# Patient Record
Sex: Male | Born: 1945 | Race: Black or African American | Hispanic: No | Marital: Single | Smoking: Former smoker
Health system: Southern US, Community
[De-identification: ages and names within clinical notes are randomized; demographics above are authoritative.]

## PROBLEM LIST (undated history)

## (undated) DIAGNOSIS — F1011 Alcohol abuse, in remission: Secondary | ICD-10-CM

## (undated) DIAGNOSIS — E785 Hyperlipidemia, unspecified: Secondary | ICD-10-CM

## (undated) DIAGNOSIS — Z720 Tobacco use: Secondary | ICD-10-CM

## (undated) DIAGNOSIS — IMO0002 Reserved for concepts with insufficient information to code with codable children: Secondary | ICD-10-CM

## (undated) DIAGNOSIS — Z8614 Personal history of Methicillin resistant Staphylococcus aureus infection: Secondary | ICD-10-CM

## (undated) DIAGNOSIS — Z8546 Personal history of malignant neoplasm of prostate: Secondary | ICD-10-CM

## (undated) DIAGNOSIS — I10 Essential (primary) hypertension: Secondary | ICD-10-CM

## (undated) DIAGNOSIS — K559 Vascular disorder of intestine, unspecified: Secondary | ICD-10-CM

## (undated) HISTORY — DX: Tobacco use: Z72.0

## (undated) HISTORY — DX: Reserved for concepts with insufficient information to code with codable children: IMO0002

## (undated) HISTORY — DX: Personal history of Methicillin resistant Staphylococcus aureus infection: Z86.14

## (undated) HISTORY — PX: COLOSTOMY: SHX63

## (undated) HISTORY — DX: Personal history of malignant neoplasm of prostate: Z85.46

## (undated) HISTORY — DX: Vascular disorder of intestine, unspecified: K55.9

## (undated) HISTORY — DX: Hyperlipidemia, unspecified: E78.5

## (undated) HISTORY — DX: Essential (primary) hypertension: I10

## (undated) HISTORY — DX: Alcohol abuse, in remission: F10.11

---

## 2002-12-20 DIAGNOSIS — IMO0002 Reserved for concepts with insufficient information to code with codable children: Secondary | ICD-10-CM

## 2002-12-20 DIAGNOSIS — F1011 Alcohol abuse, in remission: Secondary | ICD-10-CM

## 2002-12-20 DIAGNOSIS — K559 Vascular disorder of intestine, unspecified: Secondary | ICD-10-CM

## 2002-12-20 HISTORY — DX: Alcohol abuse, in remission: F10.11

## 2002-12-20 HISTORY — DX: Vascular disorder of intestine, unspecified: K55.9

## 2002-12-20 HISTORY — PX: SUBTOTAL COLECTOMY: SHX855

## 2002-12-20 HISTORY — DX: Reserved for concepts with insufficient information to code with codable children: IMO0002

## 2003-03-13 ENCOUNTER — Encounter: Payer: Self-pay | Admitting: Emergency Medicine

## 2003-03-13 ENCOUNTER — Inpatient Hospital Stay (HOSPITAL_COMMUNITY): Admission: EM | Admit: 2003-03-13 | Discharge: 2003-04-12 | Payer: Self-pay | Admitting: Emergency Medicine

## 2003-03-14 ENCOUNTER — Encounter (INDEPENDENT_AMBULATORY_CARE_PROVIDER_SITE_OTHER): Payer: Self-pay | Admitting: Specialist

## 2003-03-16 ENCOUNTER — Encounter: Payer: Self-pay | Admitting: General Surgery

## 2003-03-18 ENCOUNTER — Encounter: Payer: Self-pay | Admitting: General Surgery

## 2003-03-23 ENCOUNTER — Encounter: Payer: Self-pay | Admitting: Surgery

## 2003-03-25 ENCOUNTER — Encounter (INDEPENDENT_AMBULATORY_CARE_PROVIDER_SITE_OTHER): Payer: Self-pay | Admitting: *Deleted

## 2003-05-03 ENCOUNTER — Ambulatory Visit (HOSPITAL_COMMUNITY): Admission: RE | Admit: 2003-05-03 | Discharge: 2003-05-03 | Payer: Self-pay | Admitting: *Deleted

## 2003-09-19 ENCOUNTER — Ambulatory Visit: Admission: RE | Admit: 2003-09-19 | Discharge: 2003-12-18 | Payer: Self-pay | Admitting: Radiation Oncology

## 2004-01-13 ENCOUNTER — Ambulatory Visit: Admission: RE | Admit: 2004-01-13 | Discharge: 2004-01-13 | Payer: Self-pay | Admitting: Radiation Oncology

## 2004-07-13 ENCOUNTER — Ambulatory Visit: Admission: RE | Admit: 2004-07-13 | Discharge: 2004-07-13 | Payer: Self-pay | Admitting: Radiation Oncology

## 2007-05-27 ENCOUNTER — Emergency Department (HOSPITAL_COMMUNITY): Admission: EM | Admit: 2007-05-27 | Discharge: 2007-05-27 | Payer: Self-pay | Admitting: Emergency Medicine

## 2007-05-29 ENCOUNTER — Ambulatory Visit: Payer: Self-pay | Admitting: Emergency Medicine

## 2007-05-29 ENCOUNTER — Inpatient Hospital Stay (HOSPITAL_COMMUNITY): Admission: EM | Admit: 2007-05-29 | Discharge: 2007-06-08 | Payer: Self-pay | Admitting: *Deleted

## 2007-05-30 ENCOUNTER — Encounter: Payer: Self-pay | Admitting: Emergency Medicine

## 2007-12-21 DIAGNOSIS — Z8614 Personal history of Methicillin resistant Staphylococcus aureus infection: Secondary | ICD-10-CM

## 2007-12-21 HISTORY — DX: Personal history of Methicillin resistant Staphylococcus aureus infection: Z86.14

## 2008-04-28 IMAGING — CR DG CHEST 2V
2 series · 2 of 2 positions shown · non-contrast
Comparison: 06/03/07.

CLINICAL DATA: A 61-year-old with pneumonia and hypoxia. 
 CHEST ? 2 VIEW ? 06/07/07:

[w chest pa]
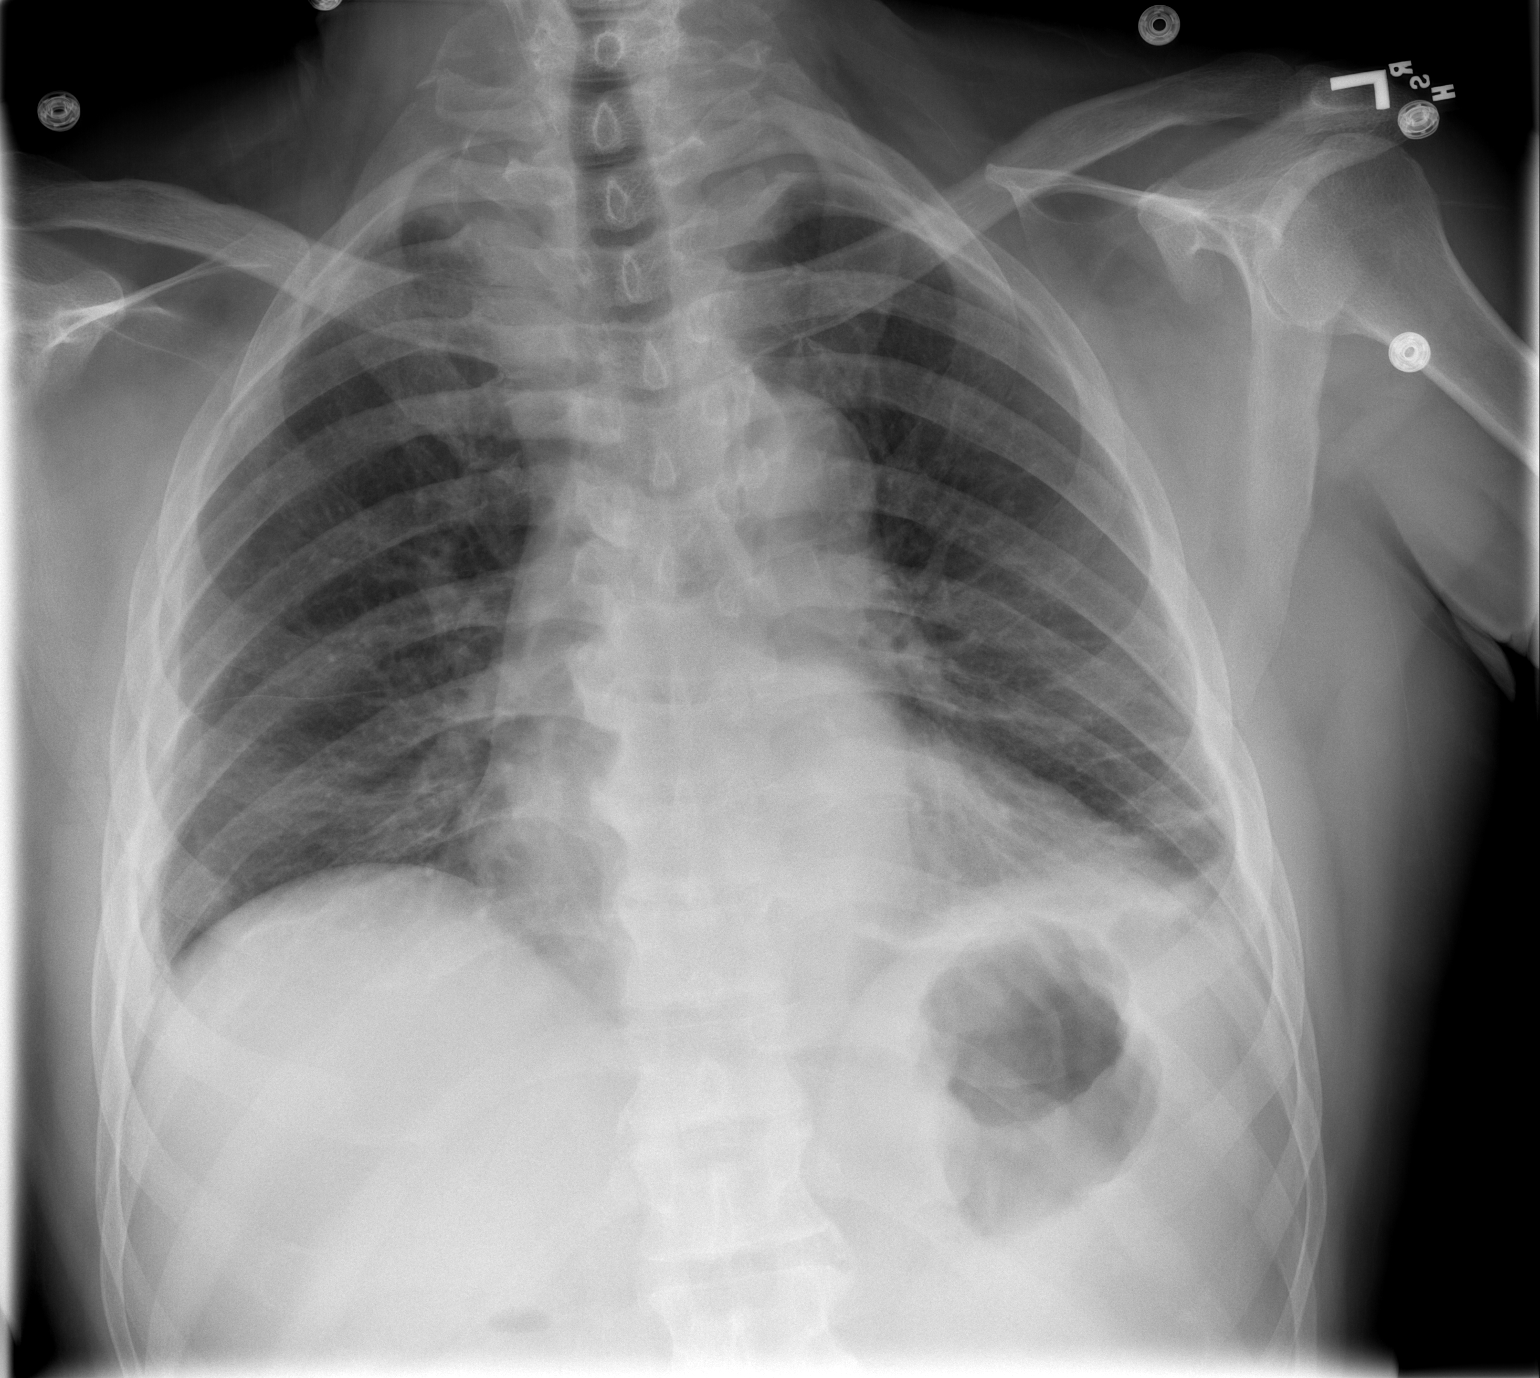

[w chest lat]
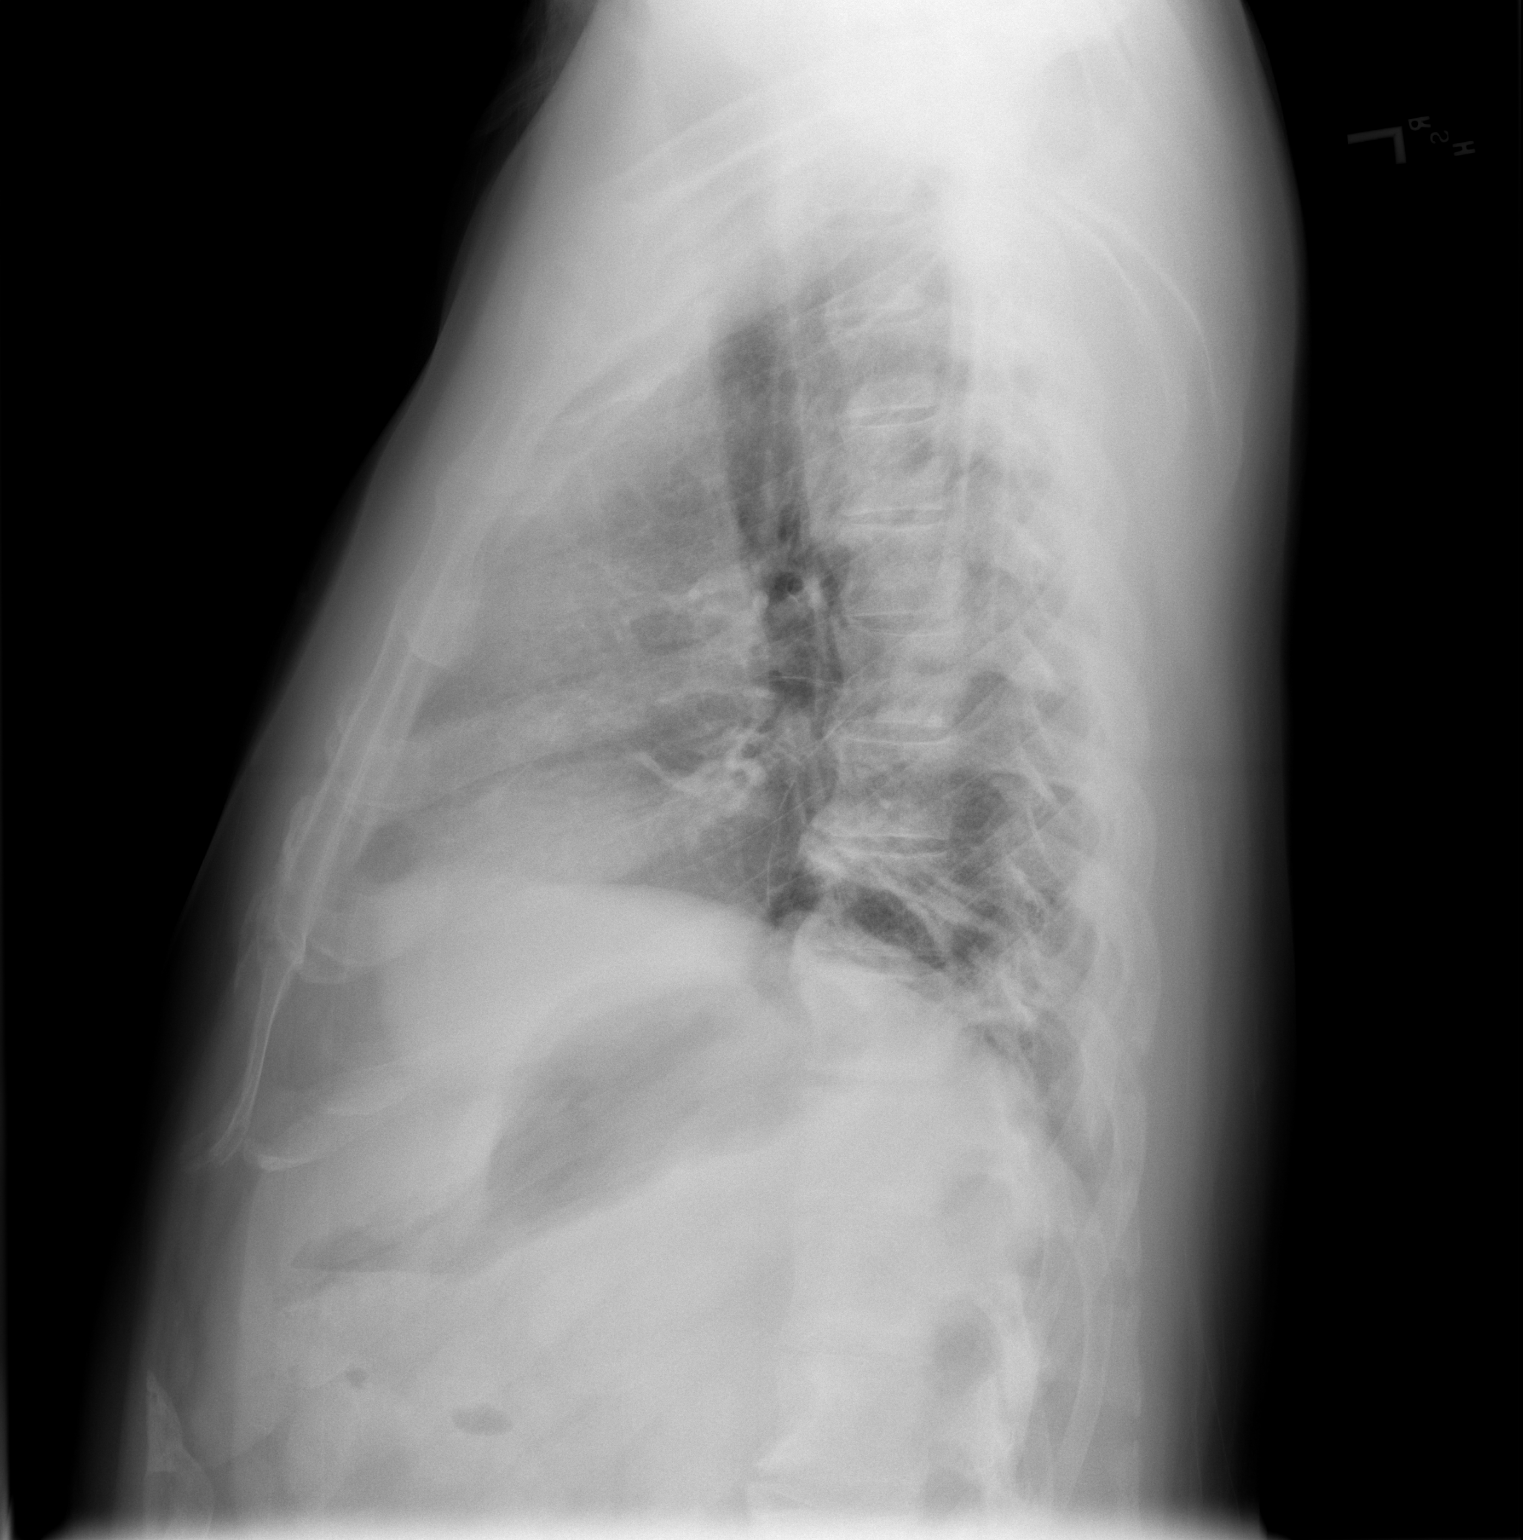

[2 of 2 positions shown; findings below may reference images not displayed]

FINDINGS: Two views of the chest demonstrate persistent interstitial densities in the left lower lung region.  There is no significant pleural fluid.  Improved lung aerations compared to the prior examination.  The heart and mediastinum are stable.  The trachea is midline.   The right PICC line has been removed.
IMPRESSION: Improved lung aeration compared to the prior exam with residual linear densities in the left lung base.  Differential includes atelectasis versus residual infection.

## 2008-07-26 ENCOUNTER — Inpatient Hospital Stay (HOSPITAL_COMMUNITY): Admission: AD | Admit: 2008-07-26 | Discharge: 2008-07-28 | Payer: Self-pay | Admitting: Internal Medicine

## 2010-07-27 ENCOUNTER — Inpatient Hospital Stay (HOSPITAL_COMMUNITY): Admission: EM | Admit: 2010-07-27 | Discharge: 2010-07-29 | Payer: Self-pay | Admitting: Emergency Medicine

## 2010-09-08 ENCOUNTER — Emergency Department (HOSPITAL_COMMUNITY): Admission: EM | Admit: 2010-09-08 | Discharge: 2010-09-08 | Payer: Self-pay | Admitting: Emergency Medicine

## 2011-01-09 ENCOUNTER — Encounter: Payer: Self-pay | Admitting: General Surgery

## 2011-03-04 LAB — DIFFERENTIAL
Basophils Absolute: 0 10*3/uL (ref 0.0–0.1)
Eosinophils Absolute: 0 10*3/uL (ref 0.0–0.7)
Eosinophils Relative: 0 % (ref 0–5)
Lymphocytes Relative: 8 % — ABNORMAL LOW (ref 12–46)
Neutrophils Relative %: 88 % — ABNORMAL HIGH (ref 43–77)

## 2011-03-04 LAB — URINE MICROSCOPIC-ADD ON

## 2011-03-04 LAB — URINALYSIS, ROUTINE W REFLEX MICROSCOPIC
Glucose, UA: NEGATIVE mg/dL
Leukocytes, UA: NEGATIVE
Protein, ur: 100 mg/dL — AB
Specific Gravity, Urine: 1.03 (ref 1.005–1.030)
pH: 5.5 (ref 5.0–8.0)

## 2011-03-04 LAB — CBC
MCV: 92.9 fL (ref 78.0–100.0)
Platelets: 190 10*3/uL (ref 150–400)
RDW: 13.1 % (ref 11.5–15.5)
WBC: 11.1 10*3/uL — ABNORMAL HIGH (ref 4.0–10.5)

## 2011-03-04 LAB — BASIC METABOLIC PANEL
BUN: 9 mg/dL (ref 6–23)
Calcium: 9.7 mg/dL (ref 8.4–10.5)
Chloride: 102 mEq/L (ref 96–112)
Creatinine, Ser: 1.03 mg/dL (ref 0.4–1.5)

## 2011-03-05 LAB — URINALYSIS, MICROSCOPIC ONLY
Glucose, UA: NEGATIVE mg/dL
Specific Gravity, Urine: 1.015 (ref 1.005–1.030)
Urobilinogen, UA: 0.2 mg/dL (ref 0.0–1.0)

## 2011-03-05 LAB — COMPREHENSIVE METABOLIC PANEL
ALT: 16 U/L (ref 0–53)
ALT: 19 U/L (ref 0–53)
AST: 20 U/L (ref 0–37)
AST: 21 U/L (ref 0–37)
Albumin: 3.7 g/dL (ref 3.5–5.2)
Alkaline Phosphatase: 46 U/L (ref 39–117)
Alkaline Phosphatase: 47 U/L (ref 39–117)
CO2: 29 mEq/L (ref 19–32)
CO2: 30 mEq/L (ref 19–32)
Calcium: 9.2 mg/dL (ref 8.4–10.5)
Chloride: 103 mEq/L (ref 96–112)
Creatinine, Ser: 1.12 mg/dL (ref 0.4–1.5)
GFR calc Af Amer: 24 mL/min — ABNORMAL LOW (ref >60)
GFR calc Af Amer: >60 mL/min (ref >60)
GFR calc non Af Amer: 20 mL/min — ABNORMAL LOW (ref >60)
GFR calc non Af Amer: >60 mL/min (ref >60)
Glucose, Bld: 110 mg/dL — ABNORMAL HIGH (ref 70–99)
Glucose, Bld: 164 mg/dL — ABNORMAL HIGH (ref 70–99)
Potassium: 3.9 mEq/L (ref 3.5–5.1)
Potassium: 4.7 mEq/L (ref 3.5–5.1)
Sodium: 133 mEq/L — ABNORMAL LOW (ref 135–145)
Sodium: 139 mEq/L (ref 135–145)
Total Bilirubin: 0.5 mg/dL (ref 0.3–1.2)
Total Protein: 6.7 g/dL (ref 6.0–8.3)
Total Protein: 7.4 g/dL (ref 6.0–8.3)

## 2011-03-05 LAB — HEMOGLOBIN A1C
Hgb A1c MFr Bld: 6.5 % — ABNORMAL HIGH (ref <5.7)
Mean Plasma Glucose: 140 mg/dL — ABNORMAL HIGH (ref <117)

## 2011-03-05 LAB — GLUCOSE, CAPILLARY
Glucose-Capillary: 107 mg/dL — ABNORMAL HIGH (ref 70–99)
Glucose-Capillary: 121 mg/dL — ABNORMAL HIGH (ref 70–99)
Glucose-Capillary: 177 mg/dL — ABNORMAL HIGH (ref 70–99)
Glucose-Capillary: 181 mg/dL — ABNORMAL HIGH (ref 70–99)
Glucose-Capillary: 93 mg/dL (ref 70–99)

## 2011-03-05 LAB — DIFFERENTIAL
Basophils Absolute: 0 10*3/uL (ref 0.0–0.1)
Basophils Relative: 0 % (ref 0–1)
Basophils Relative: 0 % (ref 0–1)
Eosinophils Absolute: 0 10*3/uL (ref 0.0–0.7)
Eosinophils Absolute: 0.1 10*3/uL (ref 0.0–0.7)
Eosinophils Relative: 0 % (ref 0–5)
Lymphocytes Relative: 4 % — ABNORMAL LOW (ref 12–46)
Lymphocytes Relative: 7 % — ABNORMAL LOW (ref 12–46)
Lymphs Abs: 0.4 10*3/uL — ABNORMAL LOW (ref 0.7–4.0)
Lymphs Abs: 1 10*3/uL (ref 0.7–4.0)
Monocytes Absolute: 0.2 10*3/uL (ref 0.1–1.0)
Monocytes Absolute: 0.9 10*3/uL (ref 0.1–1.0)
Monocytes Relative: 3 % (ref 3–12)
Monocytes Relative: 8 % (ref 3–12)
Neutro Abs: 9.3 10*3/uL — ABNORMAL HIGH (ref 1.7–7.7)
Neutrophils Relative %: 85 % — ABNORMAL HIGH (ref 43–77)
Neutrophils Relative %: 93 % — ABNORMAL HIGH (ref 43–77)

## 2011-03-05 LAB — CBC
HCT: 45.1 % (ref 39.0–52.0)
HCT: 45.7 % (ref 39.0–52.0)
Hemoglobin: 14.9 g/dL (ref 13.0–17.0)
Hemoglobin: 15 g/dL (ref 13.0–17.0)
MCH: 31.2 pg (ref 26.0–34.0)
MCH: 31.3 pg (ref 26.0–34.0)
MCHC: 33.3 g/dL (ref 30.0–36.0)
MCHC: 33.3 g/dL (ref 30.0–36.0)
MCV: 93.8 fL (ref 78.0–100.0)
Platelets: 166 10*3/uL (ref 150–400)
Platelets: 177 10*3/uL (ref 150–400)
RBC: 4.81 MIL/uL (ref 4.22–5.81)
RDW: 13.6 % (ref 11.5–15.5)
RDW: 13.8 % (ref 11.5–15.5)
RDW: 14.1 % (ref 11.5–15.5)
WBC: 10 10*3/uL (ref 4.0–10.5)
WBC: 11.9 10*3/uL — ABNORMAL HIGH (ref 4.0–10.5)

## 2011-03-05 LAB — PHOSPHORUS
Phosphorus: 2 mg/dL — ABNORMAL LOW (ref 2.3–4.6)
Phosphorus: 4.3 mg/dL (ref 2.3–4.6)

## 2011-03-05 LAB — POCT CARDIAC MARKERS: CKMB, poc: 2.8 ng/mL (ref 1.0–8.0)

## 2011-03-05 LAB — ACTH STIMULATION, 3 TIME POINTS
Cortisol, 30 Min: 23.6 ug/dL
Cortisol, 60 Min: 31.1 ug/dL
Cortisol, Base: 3.4 ug/dL

## 2011-03-05 LAB — UREA NITROGEN, URINE: Urea Nitrogen, Ur: 527 mg/dL

## 2011-03-05 LAB — BASIC METABOLIC PANEL WITH GFR
BUN: 76 mg/dL — ABNORMAL HIGH (ref 6–23)
CO2: 23 meq/L (ref 19–32)
Calcium: 8.6 mg/dL (ref 8.4–10.5)
Chloride: 99 meq/L (ref 96–112)
Creatinine, Ser: 5.42 mg/dL — ABNORMAL HIGH (ref 0.4–1.5)
GFR calc non Af Amer: 11 mL/min — ABNORMAL LOW
Glucose, Bld: 130 mg/dL — ABNORMAL HIGH (ref 70–99)
Potassium: 4.4 meq/L (ref 3.5–5.1)
Sodium: 134 meq/L — ABNORMAL LOW (ref 135–145)

## 2011-03-05 LAB — CARDIAC PANEL(CRET KIN+CKTOT+MB+TROPI)
Relative Index: 1.2 (ref 0.0–2.5)
Troponin I: 0.01 ng/mL (ref 0.00–0.06)

## 2011-03-05 LAB — MRSA PCR SCREENING: MRSA by PCR: NEGATIVE

## 2011-03-05 LAB — CK: Total CK: 342 U/L — ABNORMAL HIGH (ref 7–232)

## 2011-05-04 NOTE — Consult Note (Signed)
NAME:  [name], [name] NO.:  [id number]   MEDICAL RECORD NO.:  [id number]          PATIENT TYPE:  INP   LOCATION:  1522                         FACILITY:  [facility]   PHYSICIAN:  Lennie Muckle, MD      DATE OF BIRTH:  Oct 26, 1946   DATE OF CONSULTATION:  07/26/2008  DATE OF DISCHARGE:                                 CONSULTATION   REASON FOR CONSULTATION:  Abscess on the head.   HISTORY OF PRESENT ILLNESS:  [name] is a 65 year old male who  apparently had a swelling on his head for approximately a week.  He had  treated this at home with warm compresses and had some amount of  drainage, but he had had a previous abscess in the left neck which  caused him some complications.  Therefore, I was asked to evaluate his  head.  He has had no fevers or chills.   PAST MEDICAL HISTORY:  Diabetes, hypertension, prostate, cancer,  ischemic bowel.   SURGEON:  Ischemic bowel resection in 2004.  Neck mass I and D in 2008.   SOCIAL HISTORY:  He does smoke approximately a pack a day.  No  significant alcohol use since 2004.   MEDICATIONS:  Multiple and include Zocor, carvedilol, fish oil, vitamin  D, metformin, Viagra, aspirin, Benadryl, and amlodipine.   ALLERGIES:  NO DRUG ALLERGIES:   REVIEW OF SYSTEMS:  Negative.   PHYSICAL EXAMINATION:  Temperature is 98.2, pulse 101, blood pressure  140/80.  HEENT:  There is an approximately 3 cm lesion right on the top of his  head.  Apparent fluid is expressed.  It is fluctuant.  No  lymphadenopathy is appreciated, superior cervical chain, posterior  chain, or periauricularly.  CARDIOVASCULAR:  Regular rate and rhythm.  CHEST:  Clear to auscultation bilaterally   ASSESSMENT AND PLAN:  Abscess on the head.   Procedure was performed at the bedside with I and D of the abscess.  I  anesthetized the area with 1% lidocaine.  Using a #11 blade, I incised  the abscess cavity.  Purulent fluid was expressed.  This was sent for  culture.  Agree with vancomycin for treatment in the daily dressing  changes, likely shower tomorrow and this healing by secondary intent.  Continue with diabetic management and hopefully home in the next couple  of days.     Lennie Muckle, MD  Electronically Signed    ALA/MEDQ  D:  07/26/2008  T:  07/27/2008  Job:  [id number]

## 2011-05-04 NOTE — Discharge Summary (Signed)
NAME:  CHAS, Philip [name]                ACCOUNT NO.:  [id number]   MEDICAL RECORD NO.:  [id number]          PATIENT TYPE:  INP   LOCATION:  6715                         FACILITY:  [facility]   PHYSICIAN:  [name]. Perini, M.D.   DATE OF BIRTH:  Apr 30, 1946   DATE OF ADMISSION:  05/29/2007  DATE OF DISCHARGE:  06/08/2007                               DISCHARGE SUMMARY   DISCHARGE DIAGNOSES:  1. Hypoxic, hypercapnic, ventilator dependent respiratory failure      secondary to pneumonia and to coag-negative staph bacteremia, and      also to possible underlying chronic obstructive pulmonary disease      with chronic obstructive pulmonary disease exacerbation.  2. Right nuchal MRSA abscess resolving, left neck abscess status post      drainage with good wound healing at the time of discharge.  3. 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  4. Type 2 diabetes with uncontrolled sugars recently.  5. Prostate cancer, status post radiation therapy.  6. Hypertension.  7. Hyperlipidemia.  8. Remote heavy alcohol use in remission.  9. Tobacco abuse.   PROCEDURES:  1. Critical care consultation and management, vent dependent      respiratory failure with ventilator management.  2. A 2-D echocardiogram on May 31, 2007, which showed increased left      ventricular thickness compared to a study in 2004, and a decrease      in the size of the left atrium, clinical correlation was suggested      as there might have external compression on the left atrium.  Left      ventricular systolic section function was vigorous with an ejection      fraction of 70%, with no left ventricular regional wall motion      abnormalities noted.  Right ventricular size was at the upper      limits of normal.  3. Because of these findings, a CT scan of the chest was performed,      which was performed on June 01, 2007, which showed no evidence of      any mediastinal abscess or mass.   It did show a left lower lobe      pneumonia, left supraclavicular lymph nodes likely reactive and may      be related to the neck abscess.  He had fatty infiltration of the      liver and a hyper-dense hepatic to dome lesion versus artifact.  A      followup ultrasound of the liver is recommended.  4. Cardiology consultation.   DISCHARGE MEDICATIONS:  1. Zocor 40 mg each evening.  2. Aspirin 81 mg daily.  3. He is to stop Lotrel and take benazepril 20 mg once daily.  4. He is to resume Coreg 12.5 mg twice a day  5. Vitamin D 50,000 units once a week.  6. Fish oil supplement daily.  7. He is to start metformin 500 mg twice a day with food.  8. Levaquin 750 mg once daily for five further days.  9. Spiriva one dose daily.  10.Albuterol MDI 2 puffs every 4 hours as needed for wheezing, cough,      or shortness of breath.   HISTORY OF PRESENT ILLNESS:  Philip [name] is a pleasant 65 year old gentleman  with a history for colectomy, hypertension, hyperlipidemia, diabetes,  and prostate cancer.  He presented with several weeks of problems,  specifically a cough for 1 month that was not productive.  He denies  shortness of breath or wheezing.  He also was seen in Urgent Care 2-3  days prior to his visit with [place] and given doxycycline and ibuprofen for  a neck abscess, which was cultured and did eventually grow out MRSA.  In  the office, he was hypoxic and had poor peak flows.  He was admitted for  further evaluation and treatment.   HOSPITAL COURSE:  Philip [name] was admitted to a telemetry bed.  He was  treated with inhalers and antibiotics.  Lab work showed a low BNP and so  it was felt that he was not in congestive heart failure.  Surgery did an  incision and drainage of his left neck abscess, draining 60 mL of pus  from this.  Then that first evening of admission, he had a respiratory  arrest and required intubation and was moved to the intensive care unit.  Further evaluation revealed a  left-sided pneumonia and he did grow two  blood cultures positive for coag-negative staph that was sensitive to  oxacillin and vancomycin.  He was treated with intravenous Rocephin and  vancomycin for a total of 10 days.  He was on the ventilator for 3 days  at which time he was extubated and began to improve readily.  He was  moved to the floor.  His neck wound did well with simple dressing  changes.  His breath sounds improved and by June 08, 2007, he was deemed  stable for discharge home.   DISCHARGE PHYSICAL EXAMINATION:  VITAL SIGNS:  Temperature 98.1,  afebrile, pulse 97, respiratory rate 18, blood pressure 117/72, blood  sugars ranged from 70 to 134, 96% saturation on room air.  GENERAL:  He was in no acute distress.  He looked well.  He was  ambulating in the room without difficulty.  LUNGS:  Clear to auscultation bilaterally with no wheezes, rales or  rhonchi.  HEART:  Regular rate and rhythm with no murmur or gallop.  ABDOMEN:  Soft and nontender.  EXTREMITIES:  There was no edema.  NECK:  His left neck wound was dressed.   DISCHARGE LABORATORY DATA:  White count 10.7 with 84% segs, 9%  lymphocytes, 6% monocytes, hemoglobin 14.5, platelet count 277,000.  BNP  was less than 30.  Sodium 138, potassium 4.1, chloride 99, CO2 32, BUN  9, creatinine 0.99, glucose 77, total bili 0.9 alk phos 62, AST 23, ALT  33, total protein 7.2, albumin 2.9, calcium 9.2.  Chest x-ray on June 07, 2007, showed improved aeration of the left lung base, atelectasis  versus residual infection.  Hemoglobin A1c was 9.7.   DISCHARGE INSTRUCTIONS:  1. Philip [name] is to check his sugars twice a day before breakfast and      supper and keep a record of this.  He is to call us if his sugars      were staying over 200.  2. He is to have a followup visit with Dr. Waynard Edwards in 2 weeks.  3. A followup visit with Dr. Colin Benton.  He will call for appointment.  4. He is to follow a heart healthy, diabetic diet. 5. A home  health nurse will change his dressings.  6. He is to stop smoking.  7. He is to call if he has any recurrent problems.           ______________________________  Redge Gainer Waynard Edwards, M.D.     MAP/MEDQ  D:  06/08/2007  T:  06/08/2007  Job:  [id number]   cc:   Alfonse Ras, MD

## 2011-05-04 NOTE — Discharge Summary (Signed)
NAME:  Philip [name], Philip [name]                ACCOUNT NO.:  [id number]   MEDICAL RECORD NO.:  [id number]          PATIENT TYPE:  INP   LOCATION:  1522                         [facility]ACILITY:  [facility]   PHYSICIAN:  [name]. Perini, M.D.   DATE OF BIRTH:  02/15/1946   DATE OF ADMISSION:  07/26/2008  DATE OF DISCHARGE:  07/28/2008                               DISCHARGE SUMMARY   DISCHARGE DIAGNOSES:  1. Scalp abscess status post incision and drainage this admission with      staph aureus growing from the wound culture.  Final wound culture      pending at the time of discharge.  2. Type 2 diabetes.  3. Hypertension.  4. Hyperlipidemia.  5. Colostomy.  6. History of prostate cancer.  7. Previous methicillin-resistant Staphylococcus aureus nuchal abscess      in June 2008 with resultant sepsis and pneumonia after this was      drained.   PROCEDURES:  Incision and drainage of the scalp abscess per general  surgery on the evening of July 26, 2008 and wound care subsequently.   DISCHARGE MEDICATIONS:  1. He is to resume his Zocor 40 mg each evening.  2. Coreg 25 mg twice a day.  3. Fish oil supplement daily.  4. Vitamin D 50,000 units weekly.  5. Metformin 500 mg twice a day with food.  6. Aspirin 81 mg daily.  7. Benazepril 10 mg daily.  8. amlodipine 5 mg daily.  9. He is to also take doxycycline 1 mg twice a day for 10 further      days.  10.He may use Tylenol as needed for pain.   HISTORY OF PRESENT ILLNESS:  The patient is a very pleasant 65 year old  gentleman who presented with a 1-week history of a large fluctuant knot  on the top of his head.  He was admitted for care for this.   HOSPITAL COURSE:  Philip [name] was admitted to a regular bed and general  surgery was consulted and assisted by performing incision and drainage  procedure.  He tolerated this well.  His wound care was directed by  surgery.  He did receive intravenous vancomycin during his hospital  course.  Blood cultures remained  negative and by July 28, 2008 he was  deemed stable for discharge home.   DISCHARGE PHYSICAL EXAM:  VITAL SIGNS:  Temperature 97.9, afebrile,  pulse 98, respiratory rate 18, blood pressure 152/88, blood sugars 115  and 134, 95% saturation on room air.  CONSTITUTIONAL:  He was in no acute distress.  LUNGS:  Clear to auscultation bilaterally with no wheezes, rales or  rhonchi.  HEART:  Regular rate and rhythm with no murmur or gallop.  ABDOMEN:  Soft and nontender.  His ostomy output was normal.   DISCHARGE LABORATORY DATA:  White count 11,100 with 76% segs, 10%  lymphocytes, 8% monocytes, hemoglobin 14.3, platelet count 207,000.  Sodium 142, potassium 4.4, chloride 103, CO2 32, BUN 8, creatinine 0.85,  glucose 122, albumin 3.1, otherwise LFTs were normal.   DISCHARGE INSTRUCTIONS:  Philip [name] is to follow a low-salt diabetic diet.  He is to increase his activity slowly.  He is to have wound care as  directed by surgery, performed by home health nursing.  He is to follow  up next week on Wednesday or Thursday with the available doctor at  [facility].  He is to keep his previously scheduled visit  on August 05, 2008 with Dr. Waynard Edwards.      [name]. Perini, M.D.  Electronically Signed     MAP/MEDQ  D:  07/28/2008  T:  07/28/2008  Job:  [id number]

## 2011-05-04 NOTE — Op Note (Signed)
NAME:  Philip [name], Philip [name]                ACCOUNT NO.:  [id number]   MEDICAL RECORD NO.:  [id number]          PATIENT TYPE:  INP   LOCATION:  2025                         FACILITY:  [facility]   PHYSICIAN:  Alfonse Ras, MD   DATE OF BIRTH:  1946-04-22   DATE OF PROCEDURE:  05/29/2007  DATE OF DISCHARGE:                               OPERATIVE REPORT   SURGEON:  Alfonse Ras, MD.   DIAGNOSIS:  Left fluctuant neck mass.   POSTOPERATIVE DIAGNOSIS:  Left fluctuant neck mass.   PROCEDURE:  Incision and drainage under local anesthesia at the bedside.   DESCRIPTION OF PROCEDURE:  The patient was placed in the right lateral  decubitus position and the left posterior neck was prepped and draped in  the normal sterile fashion.  Using 1% lidocaine local anesthesia, the  skin and subcutaneous tissues were anesthetized.  Approximately a 3-cm  incision was made over the fluctuant mass with a significant amount of  purulent material removed.  This was milked until the cavity was empty.  It was packed with a 4 x 4 and then dressed.  The patient tolerated the  procedure well.      Alfonse Ras, MD  Electronically Signed     KRE/MEDQ  D:  05/29/2007  T:  05/29/2007  Job:  [id number]   cc:   Loraine Leriche A. Perini, M.D.

## 2011-05-04 NOTE — H&P (Signed)
NAME:  [name], [name] NO.:  [id number]   MEDICAL RECORD NO.:  [id number]          PATIENT TYPE:  EMS   LOCATION:  URG                          FACILITY:  [facility]   PHYSICIAN:  [name]. Perini, M.D.   DATE OF BIRTH:  September 01, 1946   DATE OF ADMISSION:  05/29/2007  DATE OF DISCHARGE:                              HISTORY & PHYSICAL   CHIEF COMPLAINT:  Cough.   HISTORY OF PRESENT ILLNESS:  Philip [name] is a pleasant 65 year old  gentleman with a past history significant for total colectomy after an  episode of ischemic bowel in 2004, prostate cancer status post radiation  therapy in 2004, hypertension, hyperlipidemia, and diabetes.  He  presents with a several week history of problems.  Apparently he has  been coughing for about one month.  It is not productive, and he denies  shortness of breath or wheezing.  He denies any edema.  He states that  he has been eating and drinking well and passing urine well, and his  ostomy has been working well.  He denies any fevers, chills or sweats.  He did develop a boil on the back of his right neck, and he had been  squeezing it and it possibly became infected.  He was seen at an urgent  care over the weekend 2-3 days ago and was given doxycycline 100 mg  twice a day for 10 days and ibuprofen 800 mg three times a day as  needed.  He has been taking the doxycycline.  He has taken a few of  ibuprofen pills.  The cough has been somewhat more deep over the last  two days.  He sleeps on two pillows, but denies any definite orthopnea  or paroxysmal nocturnal dyspnea.  In the office he was found to have a  room air saturation of 81% and peak flows in the 150 to 200 range.  Oxygen level improved to 93% with 2 L of oxygen, and he will require  admission for further evaluation of his hypoxia.   PAST MEDICAL HISTORY:  1. In March 2004 sepsis resulting in total colectomy and a permanent      colostomy for ischemic bowel and obstruction  related to this.  2. He had a longstanding abdominal wound after this episode which did      eventually heal well.  3. Prostate cancer 2004 status post radiation therapy.  4. Hypertension.  5. Hyperlipidemia.  6. Past history of heavy alcohol use but none since March 2004.  7. Type 2 diabetes since August 2005.  8. Longstanding tobacco abuse.   ALLERGIES:  None.   MEDICATIONS:  1. Zocor 40 mg each evening.  2. 81 mg aspirin daily.  3. Lotrel 10/20 one pill daily.  4. Carvedilol 12.5 mg twice a day.  5. Vitamin D 50,000 units which he just takes once a week.  6. Fish oil pill daily.   SOCIAL HISTORY:  He is single.  He lives with his mother and his sister  does check on him at times.  He has a high school education.  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  He has a 35 pack-year  smoking history and continues to smoke at least a pack a day.  No  alcohol since 2004.  No drug use.   FAMILY HISTORY:  Father died young of unknown cause.  Mother is living  and is in her mid 45s.  He has three brothers and three sisters who are  all living.   REVIEW OF SYSTEMS:  As per the History of Present Illness.  He had  expressed some pus from one of the lesions on his head, and he also has  a fluctuant area on the left posterior of his scalp.   PHYSICAL EXAMINATION:  VITAL SIGNS:  Temperature 97.3, 81% saturation on  room air improved to 93% on 2 L, weight 201 pounds which is down 1 pound  compared to a visit two months ago, blood pressure 130/60, pulse 68.  GENERAL:  He is in no acute distress.  Although he does have some slight  recruitment of accessory muscles breathing, he does not appear dyspneic.  SKIN:  There is no icterus, no pallor.  NECK:  No bruits, no JVD.  LUNGS:  Reveal markedly decreased breath sounds bilaterally with a few  rales at the bases and a few scattered inspiratory wheezes.  HEART:  Regular rate and rhythm with no significant murmur, rub or  gallop.   ABDOMEN:  Soft and nontender with no masses or hepatosplenomegaly.  EXTREMITIES:  There is no edema.  NEUROLOGICALLY:  He is grossly intact and moves extremities x4 and did  ambulate into the office.  HEENT:  The right posterior scalp shows a 2 cm x 1 cm slightly ulcerated  area, and on the left portion of his posterior scalp at the neckline  there is large fluctuant area measuring approximately 10 x 10 cm.  There  is no draining sinus to this area.  It is somewhat warm, and there is  some peeling skin over the area.   LABORATORY DATA:  Pending.   Chest x-ray done at our office shows possible bibasilar edema versus  infiltrates with some evidence of a small left pleural effusion and  fluid in the major fissure on the right.  Cardiac silhouette is normal.   ASSESSMENT/PLAN:  Hypoxia possibly pneumonia with underlying chronic  obstructive pulmonary disease and a chronic obstructive pulmonary  disease exacerbation.  However, he could have new congestive heart  failure.  We will admit.  We will check an EKG, cardiac enzymes, and a  BNP.  Given his lung findings on chest x-ray and his abscessed areas on  his scalp, we will start empirically with vancomycin and Rocephin  intravenous.  We will ask general surgery to evaluate these areas on his  neck.  We will give him Xopenex nebulizer.  Further treatment will be  determined depending on his lab work and his clinical course.  He is a  full code status.  His current status is fair.           ______________________________  Redge Gainer. Waynard Edwards, M.D.     MAP/MEDQ  D:  05/29/2007  T:  05/29/2007  Job:  [id number]

## 2011-05-04 NOTE — H&P (Signed)
NAME:  IZAYA, NETHERTON                ACCOUNT NO.:  [id number]   MEDICAL RECORD NO.:  [id number]          PATIENT TYPE:  INP   LOCATION:  1522                         FACILITY:  [facility]   PHYSICIAN:  [name]. Perini, M.D.   DATE OF BIRTH:  27-Dec-1945   DATE OF ADMISSION:  07/26/2008  DATE OF DISCHARGE:                              HISTORY & PHYSICAL   CHIEF COMPLAINT:  Lesion on top of head.   HISTORY OF PRESENT ILLNESS:  Mr. Detty is a pleasant gentleman who is 65  years old, past medical history of sepsis syndrome 2 previous times,  once in 2004 and again in 2008, as well as diabetes, hypertension,  hyperlipidemia, and a colostomy.  He presents with a 1-week history of a  mass on the top of his head.  He has had an itchy area that he has been  scratching on top of his head.  He denies any fevers.  He has not felt  ill with this.  He has  been eating normally.  His colostomy has been  functioning normally.  In the office he was noted to have a large  abscess on the top of his scalp, and he will require treatment of this.   PAST MEDICAL HISTORY:  March 2004 sepsis and total colectomy for  ischemic bowel and obstruction.  He now has a colostomy in his right  lower quadrant.  He had a long time to heal his abdominal wound in 2004,  but it did eventually heal. He was diagnosed with prostate cancer in  2004 and  underwent radiation therapy.  Other history significant for  hypertension, hyperlipidemia, alcoholism, but no drinking since 2004,  type 2 diabetes in 2005.  He had a MRSA nuchal abscess in June 2008, and  after this was drained he went into sepsis and pneumonia and was on the  ventilator for several days and then recovered.  He also has vitamin D  deficiency.   ALLERGIES:  None.   MEDICATIONS:  1. Zocor 40 mg each evening.  2. Coreg 25 mg twice a day.  3. Fish oil supplement daily.  4. Vitamin D 50,000 units once weekly.  5. Metformin 500 mg twice a day.  6. Viagra as  needed.  7. Aspirin 81 mg daily.  8. Benazepril 10 mg daily.  9. Amlodipine 5 mg daily.   SOCIAL HISTORY:  He is single.  He has no children.  He has a high  school education.  He is retired from the [employer] in 2006.  He had a  30-pack-year smoking history and continues to smoke.  No alcohol since  2004.  No illicit drug use.   FAMILY HISTORY:  Father died young of an unknown cause.  Mother is  living and is in her 7s and has hypertension.   REVIEW OF SYSTEMS:  As per the history of present illness.   PHYSICAL EXAMINATION:  VITAL SIGNS:  Weight is 198 pounds, which is  stable.  Blood pressure 146/92, pulse 68, temperature 97.7.  GENERAL:  He is in no acute distress.  HEENT:  He has a large abscess protruding from the crown of his scalp.  It is quite fluctuant with some open areas.  NECK:  He has no lymphadenopathy of his neck.  Oropharynx is clear.  CHEST:  Clear to auscultation bilaterally with no wheezes, rales or  rhonchi.  HEART:  Regular rate and rhythm with no murmur, rub or gallop.  ABDOMEN:  Soft, nontender, nondistended with no mass or  hepatosplenomegaly.  EXTREMITIES:  There is no edema.  NEUROLOGIC:  He is ambulating normally and is grossly neurologically  intact.   LABORATORY DATA:  Phosphorus is 3.9.  Sodium 142, potassium 4.8,  chloride 107, CO2 27, BUN 12, creatinine 0.93, glucose 98.  LFTs are  normal.  PTT is 32.  INR is 1.0.  White count is elevated at 13,000 with  78% segs and 11% lymphocytes, 5% monocytes.  Hemoglobin is 14.7,  hematocrit 45.2.   ASSESSMENT/PLAN:  Most likely Herbie has a methicillin-resistant  Staphylococcus aureus abscess of his scalp.  Given its size and his past  history of doing poorly with these sorts of problems, I have admitted  him and have asked general surgery to see him.  He will most likely need  some sort of drainage procedure.  I will send blood cultures and go  ahead and start him on vancomycin again given his history  of becoming  septic very easily in the past.  I do not want to have any delay in  initiation of his antibiotics although I am aware this could compromise  culture sensitivity when he eventually does have his abscess drained.  He has a full code status.  We will treat him with a sliding scale of  insulin for his diabetes and continue his antihypertensive medications.      [name]. Perini, M.D.  Electronically Signed     MAP/MEDQ  D:  07/26/2008  T:  07/26/2008  Job:  [id number]

## 2011-05-07 NOTE — Consult Note (Signed)
NAME:  Philip [name], CAPOZZI NO.:  [id number]   MEDICAL RECORD NO.:  [id number]                   PATIENT TYPE:  INP   LOCATION:  2031                                 FACILITY:  [facility]   PHYSICIAN:  Arvilla Meres, M.D. [facility]          DATE OF BIRTH:  Aug 06, 1946   DATE OF CONSULTATION:  DATE OF DISCHARGE:  03/23/2003                                   CONSULTATION   HISTORY OF PRESENT ILLNESS:  The patient is a very pleasant, 65 year old,  black male with a history of tobacco and alcohol abuse, who is now  postoperative day #1 after a colectomy secondary to colonic obstruction and  pancolitis.  A consult was called by Velora Heckler, M.D., to evaluate  persistent tachycardia and shortness of breath.   The patient denies any previous history of cardiopulmonary disease.  He was  admitted on March 13, 2003, with abdominal pain and found to have a colonic  obstruction with associated pancolitis.  He underwent colectomy on March 14, 2003.  Since that time, he has been persistently tachycardic, which was  thought to be secondary to alcohol withdrawal and pain.  He has been  followed quite closely by the medicine service.  This afternoon he had a  slight increase in shortness of breath and a chest x-ray was obtained which  showed pulmonary edema.  He was given 40 mg of IV Lasix and 5 mg of IV  Lopressor and subsequently had 800 mL of urine output.  Tonight the patient  has again developed some increased tachycardia and shortness of breath and a  cardiology consult was called to evaluate.   The patient said earlier this evening that he had some dull pain and noted  some increased shortness of breath at that time, but no feels back to  baseline.  He denies any chest pain, fever, chills, nausea, or vomiting.  He  does have a mildly productive cough of whitish sputum.   PAST MEDICAL HISTORY:  1. Colonic obstruction, status post colectomy as per HPI.  2. History of  alcohol abuse.  3. Tobacco abuse.  4. Diabetes mellitus diagnosed during this hospitalization.   MEDICATIONS:  1. Lopressor 25 mg b.i.d.  2. Lovenox 40 mg b.i.d.  3. Albuterol.  4. Ativan 2 mg q.6h.  5. Thiamine.  6. Multivitamins.  7. Primaxin.   ALLERGIES:  He has no known drug allergies.   SOCIAL HISTORY:  [place]  He works at an [employer].  He drinks significantly on the weekends with gin and rub.  Tobacco:  He said that he smokes two to three packs per day on the weekend, but not  much during the week.   PHYSICAL EXAMINATION:  GENERAL APPEARANCE:  He appears older than stated  age.  He is uncomfortable and mildly tachypneic.  He is oriented to person  and place.  VITAL  SIGNS:  He is afebrile.  The blood pressure is 112/60.  His heart rate  is 146 and regular.  His respiratory rate is 36.  He is saturating 95% on 2  L nasal cannula.  NECK:  Supple.  His JVP is a bit hard to assess.  It appears mildly elevated  8-9 cm.  CARDIAC:  He is tachycardic and regular with normal S1 and S2.  There are no  murmurs or gallops.  LUNGS:  Clear to auscultation on the left, but has diffuse crackles on the  right.  ABDOMEN:  He has a large wound which is packed.  The edges look clean, dry,  and intact.  EXTREMITIES:  Warm with no cyanosis, clubbing, or edema.   LABORATORY DATA:  White count 23,000 with a hematocrit of 27% and platelets  490,000.  His sodium is 142, potassium 4.1, BUN 15, creatinine 0.8, glucose  134, and magnesium 1.9.  His BNP on March 19, 2003, was 362.  His prealbumin  is low at 7.0.  The EKG shows sinus tachycardia at 146 with no ischemia.  The chest x-ray shows low volumes with pulmonary edema.   ASSESSMENT AND PLAN:  This is a 65 year old male as above without known  cardiac history, now with persistent sinus tachycardia and tachypnea since  his colostomy nine days ago.  I presume his tachycardia is multifactorial,  including  pain, alcohol withdrawal, infection, anemia, etc.  Clinically it  is hard to assess his volume status, but radiographically he does have some  moderate pulmonary edema, but with a normal BNP.   RECOMMENDATIONS:  1. I would transfuse him with two units of packed red blood cells with 40 mg     of IV Lasix between units.  2. I would continue daily Lasix, watching his volume status and BUN and     creatinine closely.  3. I would check an echocardiogram to evaluate his LV function.  4. I would treat underlying conditions, including possible infection,     alcohol withdrawal, and anemia as you are.  5. I would check a D-dimer and if positive, consider a spiral CT of his     chest to rule out pulmonary embolism.  6. Check thyroid panel, as well as cardiac marker.  7. Change albuterol to Xopenex.  8. Would be cautious with beta blockers.  Most of his tachycardia is likely     demand related.   We will follow with you and we appreciate the consult.  Please call with any  questions.                                               Arvilla Meres, M.D. [facility]    DB/MEDQ  D:  03/23/2003  T:  03/25/2003  Job:  [id number]

## 2011-05-07 NOTE — Discharge Summary (Signed)
NAME:  Philip [name], Philip [name]                          ACCOUNT NO.:  [id number]   MEDICAL RECORD NO.:  [id number]                   PATIENT TYPE:  INP   LOCATION:  5714                                 FACILITY:  [facility]   PHYSICIAN:  Vikki Ports, M.D.         DATE OF BIRTH:  Aug 30, 1946   DATE OF ADMISSION:  03/13/2003  DATE OF DISCHARGE:  04/12/2003                                 DISCHARGE SUMMARY   CONDITION ON DISCHARGE:  Good.   DISCHARGE MEDICATIONS:  1. Atrovent 0.5 mg in 2.5 mL q.6h.  2. Lasix 20 mg IV daily.  3. Protonix 40 mg daily.  4. Lopressor 50 mg p.o. b.i.d.  5. Norvasc 2.5 mg p.o. daily.  6. Sandostatin 100 mcg subcutaneous t.i.d.  7. Sliding scale insulin.  8. TNA.   HISTORY AND PHYSICAL:  The patient is a 65 year old alcoholic male who  presented to the emergency room in significant abdominal pain, chronic  pneumatosis, small bowel dilatation, and peritonitis.  After work-up in the  emergency room and volume resuscitation, he was taken emergently to the  operating room where he underwent small bowel resection and total abdominal  colectomy with end ileostomy.  He was maintained on IV antibiotics and  critical care consultation was obtained postoperatively.  The patient had  respiratory insufficiency, but was extubated on postoperative day number  two.  The patient went into significant delirium tremens secondary to  alcohol withdrawal.  This was treated with multivitamins, Ativan.  He  required BiPAP and pulmonary support in intensive care unit for  approximately five to six days and then was transferred to the floor.  He  was maintained again on IV antibiotics for the following three weeks.  His  ostomy was functioning by postoperative day number eight and he was advanced  to a regular diet.  On March 23, 2003 patient developed persistent sinus  tachycardia and tachypnea.  He also was having low grade fever at that time  of 100.  Later that day when his vac  dressing was taken off of his open  abdominal wound there was noted to be fecal material in the wound.  Enterocutaneous fistula was diagnosed and was treated over the next two  weeks with IV nutritional support, n.p.o. status, and wound care.  The wound  output markedly decreased over the ensuing 10 days and he was started on  clear liquids.  He had, on April 14, developed increase in output from his  midline wound and therefore was returned to n.p.o. status and somatostatin  was started.  Fistulogram showed very delayed filling of small bowel.  Vac  was applied on April 01, 2003.  Ostomy teachings persisted with the patient  until he was very comfortable changing this himself.  He was still having  output about 25 mL a shift from the vac dressing but it was cleaning up  nicely.  By postoperative day number 30 on  April 12, 2003 patient was ready  to be discharged home.   DIET:  n.p.o. except medications.    WOUND CARE:  Vac dressing.   FOLLOW UP:  With me 10-14 days after discharge.                                               Vikki Ports, M.D.    KRH/MEDQ  D:  04/12/2003  T:  04/12/2003  Job:  [id number]

## 2011-05-07 NOTE — H&P (Signed)
NAME:  Philip [name], Philip [name]                          ACCOUNT NO.:  [id number]   MEDICAL RECORD NO.:  [id number]                   PATIENT TYPE:  INP   LOCATION:  1829                                 FACILITY:  [facility]   PHYSICIAN:  Vikki Ports, M.D.         DATE OF BIRTH:  Apr 09, 1946   DATE OF ADMISSION:  03/13/2003  DATE OF DISCHARGE:                                HISTORY & PHYSICAL   ADMISSION DIAGNOSIS:  Diffuse colitis, probable peritonitis.   CONSULTING PHYSICIAN:  [name]. Perini, M.D.   HISTORY OF PRESENT ILLNESS:  The patient is a 64 year old alcoholic heavy  smoking black male with a two-day history of worsening left-sided abdominal  pain and obstipation.  The patient has had no prior episodes of this.  He  denies diarrhea or hematochezia.  He has been having no nausea or vomiting  but has had minimal p.o. since 36 hours prior to his presentation to the  emergency room.   PAST MEDICAL HISTORY:  None except for remote eye surgery.   MEDICATIONS:  Over-the-counter medications only.   ALLERGIES:  He has no known drug allergies.   SOCIAL HISTORY:  Significant for one pack per week for 30 years and  significant alcohol.   REVIEW OF SYSTEMS:  Significant for intermittent chest pain and dyspnea on  exertion, particularly over the last 3-4 weeks.  That is the only pertinent  positive in the review of systems.   PHYSICAL EXAMINATION:  VITAL SIGNS:  Temperature is 102.7, heart rate is  140, respiratory rate is 16.  CONSTITUTIONAL:  The patient is an age-appropriate black male in moderate  distress.  HEENT:  Sclerae are nonicteric.  There are no mucosal lesions.  NECK:  Supple and soft with no thyromegaly.  LUNGS:  Clear to auscultation.  HEART:  Tachycardic but has no murmurs.  ABDOMEN:  Very protuberant, tender, diffusely firm.  RECTAL:  No masses.  It is guaiac negative.  EXTREMITIES:  No deformity, clubbing, cyanosis, or edema.   LABORATORY DATA:  CT shows diffuse  dilatation of the colon with areas of  pneumatosis and probable obstruction, inflammatory versus neoplasm in the  left lower quadrant with fairly decompressed distal rectum.  Of note, there  is significant dilatation of the distal small bowel as well.  There is no  free fluid.   White count is 19,000.   IMPRESSION:  Peritonitis, probable ischemic colitis secondary to  obstruction.    PLAN:  Exploratory laparotomy, partial versus total abdominal colectomy.  Of  note, I discussed the risks and benefits with the patient and his sister and  he consents to proceed.                                               Vikki Ports, M.D.  KRH/MEDQ  D:  03/14/2003  T:  03/15/2003  Job:  [id number]

## 2011-05-07 NOTE — Consult Note (Signed)
NAME:  Philip [name], Philip [name]                          ACCOUNT NO.:  [id number]   MEDICAL RECORD NO.:  [id number]                   PATIENT TYPE:  INP   LOCATION:  1829                                 FACILITY:  [facility]   PHYSICIAN:  [name]. Perini, M.D.                DATE OF BIRTH:  06-Aug-1946   DATE OF CONSULTATION:  03/14/2003  DATE OF DISCHARGE:                                   CONSULTATION   REQUESTING PHYSICIAN:  Dr. Vikki Ports of general surgery.   CHIEF COMPLAINT:  Abdominal pain.   HISTORY OF PRESENT ILLNESS:  The patient is a 65 year old male patient who  is admitted as an unassigned patient.  He has not received regular medical  care.  He does have a history of fairly heavy alcohol use, especially on the  weekends.  He has a past history of an eye surgery but no other significant  known past medical history.  He developed abdominal pain three to four days  prior to admission.  He also had abdominal distention and some vomiting.  In  the emergency room, he was found to be acutely ill with fevers and evidence  of possible bowel obstruction or ischemia by CT scan.  General surgery was  consulted and admitted the patient and took the patient urgently to the  operating room, where he was found to have a colonic obstruction with  diffuse ischemic colitis and some patchy areas of infarction.  Furthermore,  there was evidence of perforation and coloenteric fistula.  He was treated  with total abdominal colectomy and end-to-end ileostomy and possible small  bowel resection.  He is now in the postanesthesia care unit.  He is sedated  on the ventilator.  He is currently tachycardic with marginal blood  pressure.  We have been asked to consult for medical care and eventual  assumption of primary care of this patient.   PAST MEDICAL HISTORY:  As per history of present illness, this is  essentially negative.  He does have a history of eye surgeries.   ALLERGIES:  No known drug  allergies.   MEDICATIONS:  He has been taking Pepto-Bismol prior to admission.  In the  emergency room, he was given 500 mL of saline and ibuprofen 800 mg x1.  He  was then given cefotetan preoperatively and he has been given significant IV  fluids.   SOCIAL HISTORY:  He smokes a pack of cigarettes per week and there is  significant alcohol use history.   FAMILY HISTORY:  Family history not obtainable.   REVIEW OF SYSTEMS:  Review of systems as per the history of present illness.  Furthermore, from the surgical history, it seems that the patient has had a  few episodes of dyspnea on exertion and possible tightness in the chest with  exertion in the recent past.   PHYSICAL EXAMINATION:  VITAL SIGNS:  On admission,  temperature was 102.7,  pulse 138, respiratory rate 20, blood pressure 170, systolic, 95% saturation  on room air.  Currently, temperature is 97.6, pulse 120, blood pressure  98/57, respiratory rate 12 on ventilator with oxygen saturation of 100% on  50% oxygen.  In the PACU, he has had 1200 mL in and 365 mL out in urine.  He  had 4000 or more mL of fluid total in the operating room.  He is currently  on a D-5 half normal saline drip with 20 mEq of Kay Ciel per liter, running  at 200 mL per hour; he is also on a Diprivan drip.  GENERAL:  He is intubated, sedated.  He is moving his extremities.  LUNGS:  His lungs are clear bilaterally.  CARDIAC:  He is tachycardic with no murmur or rub noted.  ABDOMEN:  Abdomen is distended.  There are no bowel sounds auscultated  currently.  His wound is dressed.  EXTREMITIES:  He has no peripheral edema.  SKIN:  Skin is cool and dry.   LABORATORY DATA:  Laboratory data from admission showed a urine with a  specific gravity of greater than 1.040, hyaline casts, 0 to 2 white cells, 0  to 2 red cells, rare bacteria, small bilirubin, 15 ketones, negative  nitrite, negative leukocytes.  White count was 19,500 with 94% segs, 3%  lymphocytes  and 3% monocytes.  Hemoglobin was 14.1, platelet count 245,000.  Sodium was 131, potassium 3.9, chloride 94, CO2 20, BUN 19, creatinine 1.2,  glucose 152.  LFTs were normal with the exception of a low albumin of 2.7.  His lipase was low at 19.   CT of the abdomen:  Preliminary report shows possible obstruction versus  bowel ischemia.  INR was 1.2 with a normal PTT.   EKG showed sinus tachycardia but was otherwise normal.   Currently, ABG shows a pH of 7.45, a PCO2 of 31, PO2 of 239 and a bicarb of  23.  White count is 7.6 with 90% segs, 7% lymphocytes, 3% monocytes.  Hemoglobin 8.3, platelet count 146,000.  Sodium is 131, potassium 3.9,  chloride 106, CO2 is 22, BUN 21, creatinine 1.5, glucose 108.  Calcium was  low at 6.3.   ASSESSMENT AND PLAN:  [age]-year-old male with colonic obstruction and  ischemic bowel, status post colectomy and ileostomy and possible small-bowel  resection.  He is now in serious condition in the postanesthesia care unit.  His urine output is picking up some at this time.  He seems to be tolerating  his tachycardia fairly well.  He is at some elevated perioperative risk for  myocardial infarction, given his unknown coronary status, calcified coronary  arteries, smoking history and report of some dyspnea on exertion  preoperatively.  I would continue him on telemetry.  We will check daily  electrocardiograms.  I suggest a goal hemoglobin of 9.0 or greater, but will  defer transfusion of packed red cells to surgery.  If there is difficulty  weaning him from the ventilator, I would suggest critical care medicine  consultation.  I agree with calcium supplementation at this time.  We will  await final pathology.  We will also check a hemoglobin A1c to screen for  underlying diabetes mellitus.  We will follow along with you.   Thank you for this consultation.   Sincerely,  [name]. Waynard Edwards, M.D.    MAP/MEDQ   D:  03/14/2003  T:  03/15/2003  Job:  [id number]   cc:   Vikki Ports, M.D.  [address], [address]  [place]  [place] [address]  Fax: [phone]

## 2011-05-07 NOTE — Op Note (Signed)
NAME:  Philip [name], Philip [name]                          ACCOUNT NO.:  [id number]   MEDICAL RECORD NO.:  [id number]                   PATIENT TYPE:  INP   LOCATION:  1829                                 FACILITY:  [facility]   PHYSICIAN:  Vikki Ports, M.D.         DATE OF BIRTH:  02-04-1946   DATE OF PROCEDURE:  03/14/2003  DATE OF DISCHARGE:                                 OPERATIVE REPORT   PREOPERATIVE DIAGNOSIS:  Colonic obstruction with colonic pneumatosis, small  bowel dilatation, and peritonitis.   POSTOPERATIVE DIAGNOSES:  1. Obstructing mass in the sigmoid colon with resulting massive dilatation     of the entire colon, with areas of infarction and perforation.  2. Coloenteric fistula.   PROCEDURES:  1. Exploratory laparotomy.  2. Total abdominal colectomy with end-ileostomy and Hartmann's pouch.  3. Takedown of coloenteric fistula with small bowel resection.   SURGEON:  Vikki Ports, M.D.   ASSISTANT:  Gita Kudo, M.D.   ANESTHESIA:  General.   DESCRIPTION OF PROCEDURE:  The patient was taken to the operating room and  placed in the supine position.  After adequate general anesthesia was  induced using endotracheal tube, nasogastric tube and Foley catheter were  placed.  The abdomen was prepped and draped in the normal sterile fashion.  Using a vertical midline incision, I dissected down to the fascia, which was  opened vertically.  On entering the abdomen, there was a significant amount  of exudate and free fluid.  Approximately 2 L of fluid was suctioned out.  The colon and small bowel completely eviscerated on opening the fascia.  The  colon was massively dilated, measuring up to 17 or 18 cm in a number of  different positions.  A large colonic mass was found in the sigmoid colon,  completely fibrosed and invading the left retroperitoneum  near the iliac  vessels, also an area of the terminal ileum that was completely adhered to  this colonic mass as  well.  This portion of small bowel was mobilized off  the colonic mass, which revealed a probable coloenteric fistula.  The small  bowel was not reparable in this area, and therefore a limited small bowel  resection was performed in the standard fashion using GIA stapling devices  and creating a side-to-side functional end-to-end anastomosis using a GIA  and TA stapling device.  The cecum was inspected, had a number of areas of  full-thickness necrosis and microperforation.  On mobilizing the colon, it  perforated with massive amounts of contrast and stool spilling into the  abdomen.  This was suctioned up and the entire right colon was mobilized.  The mesentery was taken down.  The terminal ileum was divided approximately  4-5 cm proximal to the ileocecal valve using a GIA stapling device.  The  colon was so massive that it had to be resected in sections.  The right  colon was resected first  using GIA stapling devices and Kelly clamps to  clamp off the mesentery.  This portion of mobilization of the colon because  of the massive size, infection, and fibrosis, took at least two or 2-1/2  hours.  The splenic flexure was mobilized and then the left colon mesentery  was taken down in a similar fashion.  Again the left colon was divided with  GIA stapling device and a third section of colon was passed off to the back  table.  The most difficult portion of the case was mobilizing the sigmoid  colon off the retroperitoneum.  It was so scarred down that I broke a pair  of Metzenbaum scissors and had to resort to Mayo sharp dissection off the  retroperitoneum.  The ureter could not be identified.  Indigo carmine was  given, and there was no spill noted.  After this portion of the colon was  mobilized, which took an additional hour and a half of time, the rectum was  transected using a TA stapling device at the peritoneal reflection.  Again  the ureter could not be visualized, and therefore a  one-shot IVP was  performed.  This verified flow from the distal ureter into the bladder.  The  abdomen was copiously irrigated with at least 10 L of saline solution.  The  rectal stump was tagged using a 2-0 Prolene suture.  The ileostomy was  brought out through the rectus muscle through a circular skin incision in  the right lower quadrant.  The mesentery was oriented in the anatomic  position.  After adequate complete irrigation of the abdomen, we still had  significant loss of domain but were able to close the fascia primarily using  running #1 Novofil.  The skin was left open and packed.  The ileostomy was  matured in a Brooke fashion using 3-0 Vicryl sutures.  Appliances and  dressings were applied.  The patient was taken to the PACU in critical  condition.                                                Vikki Ports, M.D.    KRH/MEDQ  D:  03/14/2003  T:  03/15/2003  Job:  [id number]   cc:   Loraine Leriche A. Waynard Edwards, M.D.  [address]  [place]  [place] [address]  Fax: [phone]

## 2011-09-17 LAB — DIFFERENTIAL
Basophils Absolute: 0
Basophils Absolute: 0
Basophils Relative: 0
Basophils Relative: 1
Eosinophils Absolute: 0.6
Eosinophils Absolute: 0.6
Eosinophils Relative: 5
Lymphocytes Relative: 11 — ABNORMAL LOW
Monocytes Absolute: 0.7
Monocytes Absolute: 0.9
Monocytes Relative: 8
Monocytes Relative: 8
Neutrophils Relative %: 76
Neutrophils Relative %: 79 — ABNORMAL HIGH

## 2011-09-17 LAB — TSH: TSH: 1.563

## 2011-09-17 LAB — GLUCOSE, CAPILLARY
Glucose-Capillary: 114 — ABNORMAL HIGH
Glucose-Capillary: 249 — ABNORMAL HIGH

## 2011-09-17 LAB — COMPREHENSIVE METABOLIC PANEL
ALT: 16
ALT: 17
AST: 16
Albumin: 3.1 — ABNORMAL LOW
Albumin: 3.5
Alkaline Phosphatase: 49
Alkaline Phosphatase: 50
Alkaline Phosphatase: 53
CO2: 28
Chloride: 107
Creatinine, Ser: 0.93
GFR calc Af Amer: >60
GFR calc non Af Amer: >60
Glucose, Bld: 122 — ABNORMAL HIGH
Glucose, Bld: 80
Potassium: 4.4
Potassium: 4.5
Potassium: 4.8
Sodium: 142
Sodium: 142
Sodium: 142
Total Bilirubin: 0.8
Total Bilirubin: 0.8
Total Protein: 6.9

## 2011-09-17 LAB — CBC
HCT: 41.3
Hemoglobin: 13.5
Hemoglobin: 14.3
Platelets: 212
RBC: 4.36
RDW: 13.7
WBC: 13 — ABNORMAL HIGH

## 2011-09-17 LAB — CULTURE, ROUTINE-ABSCESS

## 2011-09-17 LAB — CULTURE, BLOOD (ROUTINE X 2)
Culture: NO GROWTH
Culture: NO GROWTH

## 2011-09-17 LAB — PROTIME-INR: Prothrombin Time: 13.6

## 2011-09-17 LAB — ANAEROBIC CULTURE

## 2011-10-06 LAB — DIFFERENTIAL
Basophils Absolute: 0
Basophils Relative: 0
Basophils Relative: 0
Basophils Relative: 0
Eosinophils Absolute: 0.3
Eosinophils Absolute: 0.3
Eosinophils Relative: 1
Eosinophils Relative: 3
Eosinophils Relative: 3
Lymphocytes Relative: 11 — ABNORMAL LOW
Lymphocytes Relative: 9 — ABNORMAL LOW
Lymphs Abs: 0.9
Lymphs Abs: 1.1
Lymphs Abs: 1.2
Monocytes Absolute: 0.6
Monocytes Relative: 4
Monocytes Relative: 6
Monocytes Relative: 9
Neutro Abs: 10 — ABNORMAL HIGH
Neutro Abs: 8.2 — ABNORMAL HIGH
Neutro Abs: 9.1 — ABNORMAL HIGH
Neutrophils Relative %: 75
Neutrophils Relative %: 83 — ABNORMAL HIGH

## 2011-10-06 LAB — CBC
HCT: 39.5
Hemoglobin: 13.2
Hemoglobin: 13.8
MCHC: 33
MCHC: 33.3
MCV: 91.4
MCV: 92.3
Platelets: 236
Platelets: 277
RBC: 4.39
RBC: 4.62
RDW: 13.4
RDW: 13.6
RDW: 13.9
WBC: 10.6 — ABNORMAL HIGH
WBC: 10.7 — ABNORMAL HIGH

## 2011-10-06 LAB — COMPREHENSIVE METABOLIC PANEL
ALT: 26
ALT: 33
AST: 22
AST: 23
Albumin: 2.9 — ABNORMAL LOW
Alkaline Phosphatase: 50
Alkaline Phosphatase: 52
Alkaline Phosphatase: 59
BUN: 11
CO2: 34 — ABNORMAL HIGH
CO2: 37 — ABNORMAL HIGH
Calcium: 8.7
Calcium: 8.7
Calcium: 9.2
Chloride: 99
Chloride: 99
Creatinine, Ser: 0.99
GFR calc Af Amer: >60
GFR calc Af Amer: >60
GFR calc non Af Amer: >60
Glucose, Bld: 122 — ABNORMAL HIGH
Glucose, Bld: 70
Potassium: 3.7
Potassium: 3.8
Sodium: 137
Sodium: 138
Total Protein: 6
Total Protein: 6.8
Total Protein: 7.2

## 2011-10-06 LAB — HEMOGLOBIN A1C: Hgb A1c MFr Bld: 9.7 — ABNORMAL HIGH

## 2011-10-06 LAB — BASIC METABOLIC PANEL
BUN: 8
Chloride: 100
GFR calc non Af Amer: >60
Glucose, Bld: 77
Potassium: 3.8

## 2011-10-06 LAB — HEPATIC FUNCTION PANEL
ALT: 29
Alkaline Phosphatase: 51
Bilirubin, Direct: 0.1
Indirect Bilirubin: 0.8

## 2011-10-06 LAB — B-NATRIURETIC PEPTIDE (CONVERTED LAB)
Pro B Natriuretic peptide (BNP): 32
Pro B Natriuretic peptide (BNP): <30

## 2011-10-07 LAB — POCT I-STAT 3, ART BLOOD GAS (G3+)
Bicarbonate: 31.8 — ABNORMAL HIGH
Bicarbonate: 31.8 — ABNORMAL HIGH
O2 Saturation: 97
Operator id: 270211
Operator id: 270211
Patient temperature: 97.6
TCO2: 34
TCO2: 34
TCO2: 34
pCO2 arterial: 38.3
pCO2 arterial: 44.2
pCO2 arterial: 61.3
pH, Arterial: 7.328 — ABNORMAL LOW
pH, Arterial: 7.356
pH, Arterial: 7.535 — ABNORMAL HIGH
pO2, Arterial: 69 — ABNORMAL LOW
pO2, Arterial: 87

## 2011-10-07 LAB — DIFFERENTIAL
Basophils Absolute: 0
Basophils Absolute: 0
Basophils Relative: 0
Basophils Relative: 0
Eosinophils Absolute: 0
Eosinophils Absolute: 0
Eosinophils Absolute: 0.1
Eosinophils Relative: 0
Eosinophils Relative: 0
Eosinophils Relative: 4
Lymphocytes Relative: 10 — ABNORMAL LOW
Lymphocytes Relative: 3 — ABNORMAL LOW
Lymphocytes Relative: 9 — ABNORMAL LOW
Lymphs Abs: 0.6 — ABNORMAL LOW
Lymphs Abs: 1.2
Monocytes Absolute: 0.3
Monocytes Absolute: 1.1 — ABNORMAL HIGH
Monocytes Relative: 2 — ABNORMAL LOW
Monocytes Relative: 2 — ABNORMAL LOW
Monocytes Relative: 3
Monocytes Relative: 4
Monocytes Relative: 5
Neutro Abs: 17.1 — ABNORMAL HIGH
Neutro Abs: 8.7 — ABNORMAL HIGH
Neutrophils Relative %: 85 — ABNORMAL HIGH
Neutrophils Relative %: 94 — ABNORMAL HIGH

## 2011-10-07 LAB — URINALYSIS, ROUTINE W REFLEX MICROSCOPIC
Glucose, UA: 500 — AB
Hgb urine dipstick: NEGATIVE
Ketones, ur: NEGATIVE
Leukocytes, UA: NEGATIVE
pH: 6

## 2011-10-07 LAB — LEGIONELLA ANTIGEN, URINE

## 2011-10-07 LAB — CULTURE, ROUTINE-ABSCESS

## 2011-10-07 LAB — CARDIAC PANEL(CRET KIN+CKTOT+MB+TROPI)
CK, MB: 2.1
CK, MB: 2.6
CK, MB: 2.6
CK, MB: 2.6
Relative Index: INVALID
Relative Index: INVALID
Relative Index: INVALID
Relative Index: INVALID
Relative Index: INVALID
Total CK: 39
Total CK: 45
Total CK: 66
Total CK: 70
Total CK: 75
Total CK: 82
Troponin I: 0.01
Troponin I: 0.01
Troponin I: 0.02
Troponin I: 0.02
Troponin I: 0.02
Troponin I: 0.03
Troponin I: 0.03
Troponin I: 0.09 — ABNORMAL HIGH

## 2011-10-07 LAB — BASIC METABOLIC PANEL
CO2: 34 — ABNORMAL HIGH
Calcium: 9.4
Chloride: 103
Creatinine, Ser: 0.8
Glucose, Bld: 138 — ABNORMAL HIGH

## 2011-10-07 LAB — COMPREHENSIVE METABOLIC PANEL
ALT: 20
ALT: 32
AST: 14
AST: 24
Albumin: 2.6 — ABNORMAL LOW
Albumin: 2.6 — ABNORMAL LOW
Alkaline Phosphatase: 60
Alkaline Phosphatase: 63
Alkaline Phosphatase: 70
BUN: 13
BUN: 16
BUN: 9
CO2: 31
CO2: 33 — ABNORMAL HIGH
CO2: 35 — ABNORMAL HIGH
CO2: 36 — ABNORMAL HIGH
Calcium: 8.6
Calcium: 8.8
Calcium: 9.1
Creatinine, Ser: 0.81
Creatinine, Ser: 0.91
Creatinine, Ser: 1.02
GFR calc Af Amer: >60
GFR calc Af Amer: >60
GFR calc non Af Amer: >60
GFR calc non Af Amer: >60
GFR calc non Af Amer: >60
Glucose, Bld: 121 — ABNORMAL HIGH
Glucose, Bld: 56 — ABNORMAL LOW
Glucose, Bld: 76
Potassium: 4.6
Sodium: 139
Sodium: 141
Sodium: 142
Total Bilirubin: 0.6
Total Protein: 6.1
Total Protein: 6.3
Total Protein: 6.8
Total Protein: 6.8
Total Protein: 7.4

## 2011-10-07 LAB — HEPATIC FUNCTION PANEL
ALT: 29
AST: 20
Alkaline Phosphatase: 70
Bilirubin, Direct: <0.1
Total Bilirubin: 0.6

## 2011-10-07 LAB — PHOSPHORUS: Phosphorus: 4.6

## 2011-10-07 LAB — CBC
HCT: 42.9
HCT: 44.5
HCT: 45.3
Hemoglobin: 13.9
Hemoglobin: 14.3
Hemoglobin: 14.5
Hemoglobin: 15.1
MCHC: 32
MCHC: 32.5
MCHC: 32.5
MCHC: 33.1
MCHC: 33.2
MCV: 91.4
MCV: 91.8
MCV: 92.5
MCV: 93
Platelets: 268
Platelets: 286
RBC: 4.77
RBC: 4.83
RBC: 4.96
RDW: 13.3
RDW: 13.4
RDW: 13.6
RDW: 13.7
RDW: 13.9
RDW: 13.9

## 2011-10-07 LAB — URINE CULTURE
Colony Count: NO GROWTH
Culture: NO GROWTH

## 2011-10-07 LAB — VANCOMYCIN, TROUGH: Vancomycin Tr: 16.9

## 2011-10-07 LAB — CK TOTAL AND CKMB (NOT AT ARMC)
Relative Index: INVALID
Total CK: 99

## 2011-10-07 LAB — URINE MICROSCOPIC-ADD ON: WBC, UA: ELEVATED

## 2011-10-07 LAB — BLOOD GAS, ARTERIAL
Bicarbonate: 36.7 — ABNORMAL HIGH
O2 Content: 5
pH, Arterial: 7.187 — CL
pO2, Arterial: 72.1 — ABNORMAL LOW

## 2011-10-07 LAB — CULTURE, BLOOD (ROUTINE X 2)

## 2011-10-07 LAB — B-NATRIURETIC PEPTIDE (CONVERTED LAB)
Pro B Natriuretic peptide (BNP): 103 — ABNORMAL HIGH
Pro B Natriuretic peptide (BNP): 106 — ABNORMAL HIGH
Pro B Natriuretic peptide (BNP): <30

## 2011-10-07 LAB — TROPONIN I: Troponin I: 0.01

## 2011-10-07 LAB — APTT: aPTT: 25

## 2011-11-15 ENCOUNTER — Ambulatory Visit (INDEPENDENT_AMBULATORY_CARE_PROVIDER_SITE_OTHER): Payer: Medicare Other | Admitting: General Surgery

## 2011-11-15 ENCOUNTER — Encounter (INDEPENDENT_AMBULATORY_CARE_PROVIDER_SITE_OTHER): Payer: Self-pay | Admitting: General Surgery

## 2011-11-15 ENCOUNTER — Inpatient Hospital Stay (HOSPITAL_COMMUNITY)
Admission: AD | Admit: 2011-11-15 | Discharge: 2011-11-22 | DRG: 570 | Disposition: A | Discharge status: Home Health | Payer: Medicare Other | Source: Ambulatory Visit | Attending: Internal Medicine | Admitting: Internal Medicine

## 2011-11-15 ENCOUNTER — Encounter (HOSPITAL_COMMUNITY): Payer: Self-pay | Admitting: *Deleted

## 2011-11-15 VITALS — BP 128/72 | HR 86 | Temp 97.0°F | Resp 18 | Ht 70.0 in | Wt 206.2 lb

## 2011-11-15 DIAGNOSIS — L03818 Cellulitis of other sites: Principal | ICD-10-CM | POA: Diagnosis present

## 2011-11-15 DIAGNOSIS — G4733 Obstructive sleep apnea (adult) (pediatric): Secondary | ICD-10-CM | POA: Diagnosis present

## 2011-11-15 DIAGNOSIS — L0291 Cutaneous abscess, unspecified: Secondary | ICD-10-CM

## 2011-11-15 DIAGNOSIS — E872 Acidosis, unspecified: Secondary | ICD-10-CM | POA: Diagnosis not present

## 2011-11-15 DIAGNOSIS — Z22322 Carrier or suspected carrier of Methicillin resistant Staphylococcus aureus: Secondary | ICD-10-CM

## 2011-11-15 DIAGNOSIS — E785 Hyperlipidemia, unspecified: Secondary | ICD-10-CM | POA: Diagnosis present

## 2011-11-15 DIAGNOSIS — Z87891 Personal history of nicotine dependence: Secondary | ICD-10-CM

## 2011-11-15 DIAGNOSIS — E119 Type 2 diabetes mellitus without complications: Secondary | ICD-10-CM | POA: Diagnosis present

## 2011-11-15 DIAGNOSIS — J9620 Acute and chronic respiratory failure, unspecified whether with hypoxia or hypercapnia: Secondary | ICD-10-CM | POA: Diagnosis not present

## 2011-11-15 DIAGNOSIS — Z932 Ileostomy status: Secondary | ICD-10-CM

## 2011-11-15 DIAGNOSIS — J4489 Other specified chronic obstructive pulmonary disease: Secondary | ICD-10-CM | POA: Diagnosis present

## 2011-11-15 DIAGNOSIS — I10 Essential (primary) hypertension: Secondary | ICD-10-CM | POA: Diagnosis present

## 2011-11-15 DIAGNOSIS — A4902 Methicillin resistant Staphylococcus aureus infection, unspecified site: Secondary | ICD-10-CM | POA: Diagnosis present

## 2011-11-15 DIAGNOSIS — E669 Obesity, unspecified: Secondary | ICD-10-CM | POA: Diagnosis present

## 2011-11-15 DIAGNOSIS — Z7982 Long term (current) use of aspirin: Secondary | ICD-10-CM

## 2011-11-15 DIAGNOSIS — J449 Chronic obstructive pulmonary disease, unspecified: Secondary | ICD-10-CM | POA: Diagnosis present

## 2011-11-15 DIAGNOSIS — E559 Vitamin D deficiency, unspecified: Secondary | ICD-10-CM | POA: Diagnosis present

## 2011-11-15 DIAGNOSIS — Z8614 Personal history of Methicillin resistant Staphylococcus aureus infection: Secondary | ICD-10-CM

## 2011-11-15 DIAGNOSIS — K219 Gastro-esophageal reflux disease without esophagitis: Secondary | ICD-10-CM | POA: Diagnosis present

## 2011-11-15 DIAGNOSIS — R000 Tachycardia, unspecified: Secondary | ICD-10-CM | POA: Diagnosis not present

## 2011-11-15 DIAGNOSIS — K7460 Unspecified cirrhosis of liver: Secondary | ICD-10-CM | POA: Diagnosis present

## 2011-11-15 DIAGNOSIS — L02818 Cutaneous abscess of other sites: Principal | ICD-10-CM | POA: Diagnosis present

## 2011-11-15 DIAGNOSIS — Z8546 Personal history of malignant neoplasm of prostate: Secondary | ICD-10-CM

## 2011-11-15 DIAGNOSIS — Z79899 Other long term (current) drug therapy: Secondary | ICD-10-CM

## 2011-11-15 MED ORDER — ALBUTEROL SULFATE HFA 108 (90 BASE) MCG/ACT IN AERS
1.0000 | INHALATION_SPRAY | Freq: Four times a day (QID) | RESPIRATORY_TRACT | Status: DC | PRN
  Filled 2011-11-15 (×2): qty 6.7

## 2011-11-15 MED ORDER — SODIUM CHLORIDE 0.9 % IV SOLN
3.0000 g | Freq: Four times a day (QID) | INTRAVENOUS | Status: DC
  Administered 2011-11-16 (×2): 3 g via INTRAVENOUS
  Filled 2011-11-15 (×8): qty 3

## 2011-11-15 MED ORDER — SODIUM CHLORIDE 0.9 % IV SOLN
INTRAVENOUS | Status: DC
  Administered 2011-11-16 – 2011-11-17 (×4): via INTRAVENOUS

## 2011-11-15 MED ORDER — MORPHINE SULFATE 2 MG/ML IJ SOLN: 2.0000 mg | INTRAMUSCULAR | Status: DC | PRN

## 2011-11-15 NOTE — Progress Notes (Signed)
Patient ID: OZIAH VITANZA, male   DOB: [birth year], 65 y.o.   MRN: [id number]  Chief Complaint  Patient presents with  . Cyst    Evaluate cyst/abscess on scalp    HPI CLAUDIS GIOVANELLI is a 65 y.o. male.  This patient was referred today for evaluation of a recurrent abscess on the scalp. He had a prior incision and drainage of the scalp abscess approximately one year ago. Her last 5 days he began having increasing swelling and tenderness in the area and his main complaint is "itching". Other last 3 days he has had some purulent drainage. He is not any fevers or chills. He saw his doctor today who prescribed Bactrim for the wound although he has not filled the prescription yet. HPI  Past Medical History  Diagnosis Date  . Alcohol abuse, in remission 2004  . Diabetes mellitus     Type II  . HTN (hypertension)   . Hyperlipidemia   . History of prostate cancer   . Vitamin D deficiency   . Tobacco abuse   . History of colostomy 2004  . History of MRSA infection 2009  . Intestinal ischemia 2004    Past Surgical History  Procedure Date  . 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 Hernia repair     Incisional    No family history on file.  Social History History  Substance Use Topics  . Smoking status: Former Smoker    Types: Cigarettes    Quit date: 11/14/2010  . Smokeless tobacco: Not on file  . Alcohol Use: No    Allergies no known allergies  Current Outpatient Prescriptions  Medication Sig Dispense Refill  . amLODipine (NORVASC) 5 MG tablet Take 5 mg by mouth daily.        Marland Kitchen aspirin 81 MG tablet Take 81 mg by mouth daily.        . carvedilol (COREG) 25 MG tablet Take 25 mg by mouth 2 (two) times daily with a meal.        . cholecalciferol (VITAMIN D) 1000 UNITS tablet Take 1,000 Units by mouth daily.        . metFORMIN (GLUCOPHAGE) 500 MG tablet Take 500 mg by mouth 2 (two) times daily with a meal.          Review of Systems Review of Systems All other review of  systems negative or noncontributory except as stated in the HPI  Blood pressure 128/72, pulse 86, temperature 97 F (36.1 C), temperature source Temporal, resp. rate 18, height 5\' 10"  (1.778 m), weight 206 lb 3.2 oz (93.532 kg).  Physical Exam Physical Exam  Vitals reviewed. Constitutional: He is oriented to person, place, and time. He appears well-developed and well-nourished. No distress.  HENT:  Head: Normocephalic and atraumatic.       Prior scalp incision at the vertex of the scalp which is well healed. More posterior to this just to the left of the midline is a 7-8 cm area of fluctuation with a few areas of purulence and drainage he has a 2 cm area of necrosis and purulence in the central aspect.  Eyes: Conjunctivae are normal. Pupils are equal, round, and reactive to light. Right eye exhibits no discharge. Left eye exhibits no discharge. No scleral icterus.  Neck: Normal range of motion. Neck supple. No tracheal deviation present.  Cardiovascular: Normal rate.   Pulmonary/Chest: Effort normal and breath sounds normal. No stridor.  Abdominal: Soft. Bowel sounds are normal.  Musculoskeletal: Normal range of motion.  Neurological: He is alert and oriented to person, place, and time.  Skin: Skin is warm and dry. He is not diaphoretic.  Psychiatric: He has a normal mood and affect. His behavior is normal. Judgment and thought content normal.     Assessment    Posterior scalp abscess    Plan    I examined the patient with Dr. Derrell Lolling and we both agree that he would best perform incision and drainage under anesthesia in the operating room given the size and area of this abscess. We will plan to admit him to less than in hospital for possible I&D later tonight or as soon as available.       Lodema Pilot DAVID 11/15/2011, 4:26 PM

## 2011-11-16 ENCOUNTER — Inpatient Hospital Stay (HOSPITAL_COMMUNITY): Payer: Medicare Other

## 2011-11-16 ENCOUNTER — Inpatient Hospital Stay (HOSPITAL_COMMUNITY): Payer: Medicare Other | Admitting: Anesthesiology

## 2011-11-16 ENCOUNTER — Encounter (HOSPITAL_COMMUNITY): Admission: AD | Disposition: A | Discharge status: Home Health | Payer: Self-pay | Source: Ambulatory Visit

## 2011-11-16 ENCOUNTER — Encounter (HOSPITAL_COMMUNITY): Payer: Self-pay | Admitting: Anesthesiology

## 2011-11-16 ENCOUNTER — Encounter (HOSPITAL_COMMUNITY): Payer: Self-pay | Admitting: Certified Registered Nurse Anesthetist

## 2011-11-16 ENCOUNTER — Encounter (HOSPITAL_COMMUNITY): Payer: Self-pay

## 2011-11-16 ENCOUNTER — Other Ambulatory Visit: Payer: Self-pay

## 2011-11-16 ENCOUNTER — Other Ambulatory Visit (INDEPENDENT_AMBULATORY_CARE_PROVIDER_SITE_OTHER): Payer: Self-pay | Admitting: General Surgery

## 2011-11-16 DIAGNOSIS — L02818 Cutaneous abscess of other sites: Secondary | ICD-10-CM

## 2011-11-16 DIAGNOSIS — L03818 Cellulitis of other sites: Secondary | ICD-10-CM

## 2011-11-16 DIAGNOSIS — J449 Chronic obstructive pulmonary disease, unspecified: Secondary | ICD-10-CM | POA: Diagnosis present

## 2011-11-16 DIAGNOSIS — J9600 Acute respiratory failure, unspecified whether with hypoxia or hypercapnia: Secondary | ICD-10-CM

## 2011-11-16 DIAGNOSIS — E872 Acidosis: Secondary | ICD-10-CM

## 2011-11-16 DIAGNOSIS — Z22322 Carrier or suspected carrier of Methicillin resistant Staphylococcus aureus: Secondary | ICD-10-CM

## 2011-11-16 DIAGNOSIS — L0291 Cutaneous abscess, unspecified: Secondary | ICD-10-CM | POA: Diagnosis present

## 2011-11-16 DIAGNOSIS — R7989 Other specified abnormal findings of blood chemistry: Secondary | ICD-10-CM

## 2011-11-16 DIAGNOSIS — I10 Essential (primary) hypertension: Secondary | ICD-10-CM | POA: Diagnosis present

## 2011-11-16 DIAGNOSIS — E119 Type 2 diabetes mellitus without complications: Secondary | ICD-10-CM | POA: Diagnosis present

## 2011-11-16 LAB — BLOOD GAS, ARTERIAL
Acid-base deficit: 2 mmol/L (ref 0.0–2.0)
Acid-base deficit: 1.1 mmol/L (ref 0.0–2.0)
Delivery systems: POSITIVE
Drawn by: 129801
Drawn by: 244901
FIO2: 0.6 %
FIO2: 0.6 %
Inspiratory PAP: 16
MECHVT: 580 mL
PEEP: 5 cmH2O
Patient temperature: 98.6
RATE: 16 resp/min
pCO2 arterial: 147 mmHg (ref 35.0–45.0)
pCO2 arterial: 71.4 mmHg (ref 35.0–45.0)
pH, Arterial: 7.217 — ABNORMAL LOW (ref 7.350–7.450)
pO2, Arterial: 61.3 mmHg — ABNORMAL LOW (ref 80.0–100.0)

## 2011-11-16 LAB — CBC
Hemoglobin: 12.9 g/dL — ABNORMAL LOW (ref 13.0–17.0)
MCH: 29.5 pg (ref 26.0–34.0)
MCHC: 30.1 g/dL (ref 30.0–36.0)
Platelets: 228 10*3/uL (ref 150–400)
RDW: 13.1 % (ref 11.5–15.5)

## 2011-11-16 LAB — DIFFERENTIAL
Basophils Absolute: 0 10*3/uL (ref 0.0–0.1)
Basophils Relative: 0 % (ref 0–1)
Eosinophils Absolute: 0.3 10*3/uL (ref 0.0–0.7)
Monocytes Relative: 8 % (ref 3–12)
Neutro Abs: 11.7 10*3/uL — ABNORMAL HIGH (ref 1.7–7.7)
Neutrophils Relative %: 80 % — ABNORMAL HIGH (ref 43–77)

## 2011-11-16 LAB — GLUCOSE, CAPILLARY
Glucose-Capillary: 134 mg/dL — ABNORMAL HIGH (ref 70–99)
Glucose-Capillary: 164 mg/dL — ABNORMAL HIGH (ref 70–99)

## 2011-11-16 LAB — BASIC METABOLIC PANEL
BUN: 25 mg/dL — ABNORMAL HIGH (ref 6–23)
Calcium: 9.6 mg/dL (ref 8.4–10.5)
GFR calc Af Amer: 56 mL/min — ABNORMAL LOW (ref >90)
GFR calc non Af Amer: 48 mL/min — ABNORMAL LOW (ref >90)
Glucose, Bld: 163 mg/dL — ABNORMAL HIGH (ref 70–99)
Potassium: 4.7 mEq/L (ref 3.5–5.1)
Sodium: 141 mEq/L (ref 135–145)

## 2011-11-16 LAB — GRAM STAIN

## 2011-11-16 LAB — SURGICAL PCR SCREEN: MRSA, PCR: POSITIVE — AB

## 2011-11-16 LAB — PROTIME-INR
INR: 1.12 (ref 0.00–1.49)
Prothrombin Time: 14.6 seconds (ref 11.6–15.2)

## 2011-11-16 SURGERY — INCISION AND DRAINAGE, ABSCESS
Anesthesia: Monitor Anesthesia Care | Site: Scalp | Wound class: Dirty or Infected

## 2011-11-16 MED ORDER — VANCOMYCIN HCL 1000 MG IV SOLR
750.0000 mg | Freq: Two times a day (BID) | INTRAVENOUS | Status: DC
  Administered 2011-11-16 – 2011-11-18 (×5): 750 mg via INTRAVENOUS
  Filled 2011-11-16 (×6): qty 750

## 2011-11-16 MED ORDER — MIDAZOLAM HCL 5 MG/ML IJ SOLN
1.0000 mg | INTRAMUSCULAR | Status: DC | PRN
  Administered 2011-11-16: 19:00:00 via INTRAVENOUS
  Administered 2011-11-17 – 2011-11-18 (×2): 2 mg via INTRAVENOUS
  Filled 2011-11-16 (×2): qty 1

## 2011-11-16 MED ORDER — LIDOCAINE HCL 1 % IJ SOLN: INTRAMUSCULAR | Status: DC | PRN

## 2011-11-16 MED ORDER — MIDAZOLAM HCL 5 MG/5ML IJ SOLN
INTRAMUSCULAR | Status: DC | PRN
  Administered 2011-11-16 (×2): 1 mg via INTRAVENOUS

## 2011-11-16 MED ORDER — AMLODIPINE BESYLATE 5 MG PO TABS
5.0000 mg | ORAL_TABLET | Freq: Every day | ORAL | Status: DC
  Administered 2011-11-17 – 2011-11-21 (×5): 5 mg via ORAL
  Filled 2011-11-16 (×7): qty 1

## 2011-11-16 MED ORDER — ASPIRIN EC 81 MG PO TBEC
81.0000 mg | DELAYED_RELEASE_TABLET | Freq: Every day | ORAL | Status: DC
  Filled 2011-11-16 (×2): qty 1

## 2011-11-16 MED ORDER — SUCCINYLCHOLINE CHLORIDE 20 MG/ML IJ SOLN
INTRAMUSCULAR | Status: DC | PRN
  Administered 2011-11-16: 100 mg via INTRAVENOUS

## 2011-11-16 MED ORDER — VITAMIN D3 25 MCG (1000 UNIT) PO TABS
1000.0000 [IU] | ORAL_TABLET | Freq: Every day | ORAL | Status: DC
  Administered 2011-11-17 – 2011-11-22 (×6): 1000 [IU] via ORAL
  Filled 2011-11-16 (×7): qty 1

## 2011-11-16 MED ORDER — SODIUM CHLORIDE 0.9 % IV SOLN
3.0000 g | INTRAVENOUS | Status: DC | PRN
  Administered 2011-11-16: 3 g via INTRAVENOUS

## 2011-11-16 MED ORDER — OXYCODONE-ACETAMINOPHEN 5-325 MG PO TABS: 1.0000 | ORAL_TABLET | ORAL | Status: DC | PRN

## 2011-11-16 MED ORDER — LACTATED RINGERS IV SOLN
INTRAVENOUS | Status: DC
  Administered 2011-11-16: 1000 mL via INTRAVENOUS

## 2011-11-16 MED ORDER — CHLORHEXIDINE GLUCONATE 0.12 % MT SOLN
15.0000 mL | Freq: Two times a day (BID) | OROMUCOSAL | Status: DC
  Administered 2011-11-17 – 2011-11-19 (×6): 15 mL via OROMUCOSAL
  Filled 2011-11-16 (×7): qty 15

## 2011-11-16 MED ORDER — PIPERACILLIN-TAZOBACTAM 3.375 G IVPB
3.3750 g | Freq: Three times a day (TID) | INTRAVENOUS | Status: DC
  Administered 2011-11-16 – 2011-11-19 (×9): 3.375 g via INTRAVENOUS
  Filled 2011-11-16 (×11): qty 50

## 2011-11-16 MED ORDER — BIOTENE DRY MOUTH MT LIQD
15.0000 mL | Freq: Four times a day (QID) | OROMUCOSAL | Status: DC
  Administered 2011-11-17 – 2011-11-22 (×15): 15 mL via OROMUCOSAL

## 2011-11-16 MED ORDER — LIDOCAINE HCL 1 % IJ SOLN
INTRAMUSCULAR | Status: DC | PRN
  Administered 2011-11-16: 12:00:00 via INTRAMUSCULAR

## 2011-11-16 MED ORDER — METFORMIN HCL 500 MG PO TABS
500.0000 mg | ORAL_TABLET | Freq: Two times a day (BID) | ORAL | Status: DC
  Filled 2011-11-16 (×3): qty 1

## 2011-11-16 MED ORDER — FENTANYL CITRATE 0.05 MG/ML IJ SOLN
25.0000 ug | INTRAMUSCULAR | Status: DC | PRN
Start: 2011-11-16 — End: 2011-11-18
  Administered 2011-11-16 – 2011-11-18 (×3): 50 ug via INTRAVENOUS
  Filled 2011-11-16 (×3): qty 2

## 2011-11-16 MED ORDER — MIDAZOLAM HCL 5 MG/ML IJ SOLN
INTRAMUSCULAR | Status: AC
  Filled 2011-11-16: qty 1

## 2011-11-16 MED ORDER — ETOMIDATE 2 MG/ML IV SOLN
INTRAVENOUS | Status: DC | PRN
  Administered 2011-11-16: 10 mg via INTRAVENOUS

## 2011-11-16 MED ORDER — KETAMINE HCL 10 MG/ML IJ SOLN
INTRAMUSCULAR | Status: DC | PRN
  Administered 2011-11-16: 10 mg via INTRAVENOUS

## 2011-11-16 MED ORDER — CARVEDILOL 25 MG PO TABS
25.0000 mg | ORAL_TABLET | Freq: Two times a day (BID) | ORAL | Status: DC
  Administered 2011-11-17 – 2011-11-22 (×11): 25 mg via ORAL
  Filled 2011-11-16 (×13): qty 1

## 2011-11-16 MED ORDER — FENTANYL CITRATE 0.05 MG/ML IJ SOLN: 25.0000 ug | INTRAMUSCULAR | Status: DC | PRN

## 2011-11-16 SURGICAL SUPPLY — 34 items
BANDAGE COBAN STERILE 4 (GAUZE/BANDAGES/DRESSINGS) ×1 IMPLANT
BANDAGE GAUZE ELAST BULKY 4 IN (GAUZE/BANDAGES/DRESSINGS) IMPLANT
BLADE SURG 15 STRL LF DISP TIS (BLADE) ×1 IMPLANT
BLADE SURG 15 STRL SS (BLADE) ×2
CANISTER SUCTION 2500CC (MISCELLANEOUS) ×2 IMPLANT
CLOTH BEACON ORANGE TIMEOUT ST (SAFETY) ×2 IMPLANT
COVER SURGICAL LIGHT HANDLE (MISCELLANEOUS) ×2 IMPLANT
DECANTER SPIKE VIAL GLASS SM (MISCELLANEOUS) IMPLANT
DRAPE LAPAROSCOPIC ABDOMINAL (DRAPES) IMPLANT
DRSG PAD ABDOMINAL 8X10 ST (GAUZE/BANDAGES/DRESSINGS) IMPLANT
ELECT CAUTERY BLADE 6.4 (BLADE) ×2 IMPLANT
ELECT REM PT RETURN 9FT ADLT (ELECTROSURGICAL) ×2
ELECTRODE REM PT RTRN 9FT ADLT (ELECTROSURGICAL) ×1 IMPLANT
GAUZE SPONGE 4X4 12PLY STRL LF (GAUZE/BANDAGES/DRESSINGS) ×1 IMPLANT
GLOVE BIO SURGEON STRL SZ7.5 (GLOVE) ×2 IMPLANT
GOWN STRL NON-REIN LRG LVL3 (GOWN DISPOSABLE) ×4 IMPLANT
KIT BASIN OR (CUSTOM PROCEDURE TRAY) ×2 IMPLANT
NDL HYPO 25X1 1.5 SAFETY (NEEDLE) IMPLANT
NEEDLE HYPO 25X1 1.5 SAFETY (NEEDLE) IMPLANT
NS IRRIG 1000ML POUR BTL (IV SOLUTION) ×2 IMPLANT
PENCIL BUTTON HOLSTER BLD 10FT (ELECTRODE) ×2 IMPLANT
SPONGE GAUZE 4X4 12PLY (GAUZE/BANDAGES/DRESSINGS) IMPLANT
SPONGE LAP 18X18 X RAY DECT (DISPOSABLE) ×2 IMPLANT
SUT MNCRL AB 4-0 PS2 18 (SUTURE) IMPLANT
SUT VIC AB 3-0 SH 27 (SUTURE)
SUT VIC AB 3-0 SH 27XBRD (SUTURE) IMPLANT
SWAB COLLECTION DEVICE MRSA (MISCELLANEOUS) IMPLANT
SYR BULB 3OZ (MISCELLANEOUS) ×2 IMPLANT
SYR CONTROL 10ML LL (SYRINGE) IMPLANT
TAPE CLOTH SURG 4X10 WHT LF (GAUZE/BANDAGES/DRESSINGS) ×1 IMPLANT
TOWEL OR 17X26 10 PK STRL BLUE (TOWEL DISPOSABLE) ×2 IMPLANT
TUBE ANAEROBIC SPECIMEN COL (MISCELLANEOUS) IMPLANT
WATER STERILE IRR 1000ML POUR (IV SOLUTION) IMPLANT
YANKAUER SUCT BULB TIP NO VENT (SUCTIONS) ×2 IMPLANT

## 2011-11-16 NOTE — Progress Notes (Signed)
Patient was noted to have Abnormal Sleep Pattern/Movement. The patient was visualized while sleeping & noted to have Facial Tension/Grimacing, Clenching Jaws/Teeth, & Rigid Jerky Movement. Patient was aroused several times to be completely Alert & Oriented. Charge Nurse made aware & Rapid Response RN made aware/visualized. Patient currently on 6 L/Min via nasal cannula with O2 Sat 90-91% & DeSat's with minimal movement, remaining vitals WNL.  CXR performed with no acute findings (Chronic Atelectasis & Mild Cardiomegaly). Will continue to monitor the patient. Bennetta Laos, RN

## 2011-11-16 NOTE — Transfer of Care (Signed)
Immediate Anesthesia Transfer of Care Note  Patient: Philip [name]  Procedure(s) Performed:  INCISION AND DRAINAGE ABSCESS - Inicision and drainage of scalp abcess  Patient Location: PACU  Anesthesia Type: MAC  Level of Consciousness: awake and sedated  Airway & Oxygen Therapy: Patient Spontanous Breathing and Patient connected to face mask  Post-op Assessment: Report given to PACU RN and Post -op Vital signs reviewed and stable  Post vital signs: Reviewed and stable  Complications: No apparent anesthesia complications

## 2011-11-16 NOTE — Anesthesia Preprocedure Evaluation (Addendum)
Anesthesia Evaluation  Patient identified by MRN, date of birth, ID band Patient awake    Reviewed: Allergy & Precautions, H&P , NPO status , Patient's Chart, lab work & pertinent test results  Airway Mallampati: II      Dental  (+) Teeth Intact   Pulmonary neg pulmonary ROS, Current Smoker,    + decreased breath sounds      Cardiovascular hypertension, neg cardio ROS Tachycardia    Neuro/Psych Negative Neurological ROS  Negative Psych ROS   GI/Hepatic negative GI ROS, Neg liver ROS, GERD-  ,(+) Cirrhosis -       ,   Endo/Other  Negative Endocrine ROSDiabetes mellitus-  Renal/GU negative Renal ROS  Genitourinary negative   Musculoskeletal negative musculoskeletal ROS (+)   Abdominal (+) obese,   Peds negative pediatric ROS (+)  Hematology negative hematology ROS (+)   Anesthesia Other Findings   Reproductive/Obstetrics negative OB ROS                           Anesthesia Physical Anesthesia Plan  ASA: IV  Anesthesia Plan: MAC   Post-op Pain Management:    Induction: Intravenous  Airway Management Planned: Simple Face Mask and Nasal Cannula  Additional Equipment:   Intra-op Plan:   Post-operative Plan:   Informed Consent: I have reviewed the patients History and Physical, chart, labs and discussed the procedure including the risks, benefits and alternatives for the proposed anesthesia with the patient or authorized representative who has indicated his/her understanding and acceptance.     Plan Discussed with: CRNA  Anesthesia Plan Comments:         Anesthesia Quick Evaluation

## 2011-11-16 NOTE — Preoperative (Signed)
Beta Blockers   Reason not to administer Beta Blockers:Not Applicable 

## 2011-11-16 NOTE — Progress Notes (Signed)
ANTIBIOTIC CONSULT NOTE - INITIAL  Pharmacy Consult for Vancomycin  Indication: Staph scalp abscess/ possible sepsis  No Known Allergies  Patient Measurements: Height: 5\' 10"  (177.8 cm) Weight: 206 lb (93.441 kg) IBW/kg (Calculated) : 73    Vital Signs: Temp: 97.4 F (36.3 C) (11/27 1400) Temp src: Oral (11/27 0801) BP: 123/59 mmHg (11/27 1415) Pulse Rate: 86  (11/27 1415) Intake/Output from previous day: 11/26 0701 - 11/27 0700 In: 660 [I.V.:460; IV Piggyback:200] Out: -  Intake/Output from this shift: Total I/O In: 600 [IV Piggyback:600] Out: 75 [Blood:75]  Labs:  [facility] 11/15/11 2352  WBC 14.7*  HGB 12.9*  PLT 228  LABCREA --  CREATININE 1.46*   Estimated Creatinine Clearance: 57.2 ml/min (by C-G formula based on Cr of 1.46). No results found for this basename: VANCOTROUGH:2,VANCOPEAK:2,VANCORANDOM:2,GENTTROUGH:2,GENTPEAK:2,GENTRANDOM:2,TOBRATROUGH:2,TOBRAPEAK:2,TOBRARND:2,AMIKACINPEAK:2,AMIKACINTROU:2,AMIKACIN:2, in the last 72 hours   Microbiology: Recent Results (from the past 720 hour(s))  SURGICAL PCR SCREEN     Status: Abnormal   Collection Time   11/16/11  1:01 AM      Component Value Range Status Comment   MRSA, PCR POSITIVE (*) NEGATIVE  Final    Staphylococcus aureus POSITIVE (*) NEGATIVE  Final   GRAM STAIN     Status: Normal   Collection Time   11/16/11 11:30 AM      Component Value Range Status Comment   Specimen Description ABSCESS SCALP   Final    Special Requests CALL [id number]/ [id number]   Final    Gram Stain     Final    Value: NO WBC SEEN     GRAM POSITIVE COCCI FEW     Gram Stain Report Called to,Read Back By and Verified With: Lennox Pippins RN 11/16/11 1300 A. NAVARRO   Report Status 11/16/2011 FINAL   Final     Medical History: Past Medical History  Diagnosis Date  . Alcohol abuse, in remission 2004  . Diabetes mellitus     Type II  . HTN (hypertension)   . Hyperlipidemia   . History of prostate cancer   . Vitamin D deficiency     . Tobacco abuse   . History of colostomy 2004  . History of MRSA infection 2009  . Intestinal ischemia 2004    Medications:  Scheduled:    . amLODipine  5 mg Oral Daily  . aspirin EC  81 mg Oral Daily  . carvedilol  25 mg Oral BID WC  . cholecalciferol  1,000 Units Oral Daily  . metFORMIN  500 mg Oral BID WC  . piperacillin-tazobactam (ZOSYN)  IV  3.375 g Intravenous Q8H  . DISCONTD: ampicillin-sulbactam (UNASYN) IV  3 g Intravenous Q6H   Infusions:    . sodium chloride 100 mL/hr at 11/16/11 0138  . DISCONTD: lactated ringers 1,000 mL (11/16/11 6295)   Assessment: 41 year olf male with staph scalp abscess with possible sepsis.  S/P I & D of abscess.  H/O MRSA infection.   IV Vancomycin to begin.  CrCl (n) = 50 ml/min.  Pt also on IV Zosyn 3.375gm IV q8h.  Goal of Therapy:  Vancomycin trough level 15-20 mcg/ml  Plan:  1.  Vancomycin 750mg  IV q12h 2.  Check vancomycin trough level as appropriate. 3.  Follow renal function and culture data.   Suzannah Bettes, Joselyn Glassman 11/16/2011,3:33 PM

## 2011-11-16 NOTE — Anesthesia Procedure Notes (Signed)
Procedure Name: Intubation Date/Time: 11/16/2011 6:24 PM Performed by: [place], Tydarius Yawn C Pre-anesthesia Checklist: Patient identified, Emergency Drugs available, Suction available and Patient being monitored Preoxygenation: Pre-oxygenation with 100% oxygen Intubation Type: IV induction Laryngoscope Size: Mac and 4 Grade View: Grade II Tube type: Oral Tube size: 8.0 mm Number of attempts: 1 Placement Confirmation: ETT inserted through vocal cords under direct vision,  CO2 detector and breath sounds checked- equal and bilateral Secured at: 21 cm Tube secured with: Tape Dental Injury: Teeth and Oropharynx as per pre-operative assessment  Comments: Called to ICU for intubation per Internal MD request.  Dr Okey Dupre present.  Smooth AOI as above. VSS. ETT taped by RT

## 2011-11-16 NOTE — Op Note (Signed)
  Surgeon: Glenna Fellows T   Assistants: none  Anesthesia: Monitored Local Anesthesia with Sedation  Indications: large scalp abscess    Procedure Detail: patient is brought to the operating room and placed in the supine position. IV sedation was administered. He was already on broad-spectrum antibiotics. The hair around the large posterior scalp abscess was shaved and the area was sterilely prepped and draped. Examination revealed an approximately 5-6 cm area of fluctuance and multiple draining sinus tracts with purulent material. Local anesthesia was used to extensively infiltrate the soft tissue. I then performed a full-thickness excision of the central area of the scalp about 4-5 cm in diameter which contained many draining sinus tracts. This opened into a deep cavity superficial to the galea with some deep necrotic tissue that was debrided. There was bogginess about 2 cm in all directions and with pressure exuded a large amount of purulent material from the tissue. Using cautery and blunt dissection out laterally this entire area was opened up and drained. Hemostasis was obtained with cautery. The wound was then packed with moist saline gauze and dry dressing applied. The patient was taken to PACU in stable condition.   Estimated Blood Loss:  less than 50 mL                Specimens: scalp tissue, culture and sensitivity and stat Gram stain        Complications:  * No complications entered in OR log *         Disposition: PACU - hemodynamically stable.         Condition: stable     Mariella Saa MD, FACS  11/16/2011, 12:20 PM

## 2011-11-16 NOTE — Consult Note (Signed)
Reason for Consult:Post operative follow up. Decreased mental status Referring Physician:Dr. Norah Fick is an 65 y.o. male.  HPI: Philip "[name]" is a pleasant gentleman well known to me.  He has a history of diabetes type 2, htn, hyperlipidemia and colostomy.  He has have several episodes (3 or 4) over the last 5 years or so of scalp abscess requiring surgery.  He has usually become more sick with these episodes requiring stepdown or icu care.  This episode is notable for him presenting with a recurrent scalp abscess. He underwent I and D today.  Post op now he is obtunded.  His vital signs are stable but he is not arousable.   ABG just performed shows a pCo2 of 147 and a ph of 7.001.  He has a long smoking history and has continued to smoke and he does likely have some underlying COPD.    Past Medical History  Diagnosis Date  . Alcohol abuse, in remission 2004  . Diabetes mellitus     Type II  . HTN (hypertension)   . Hyperlipidemia   . History of prostate cancer   . Vitamin D deficiency   . Tobacco abuse   . History of colostomy 2004  . History of MRSA infection 2009  . Intestinal ischemia 2004  Episode acute renal failure due to dehydration in setting of ace inhibitor in the last year.  Past Surgical History  Procedure Date  . 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 Colostomy   8/09 scalp abscess i and d with  6/08 nuchal abscess with sepsis /pneumonia  History reviewed. Father died at young age of unknown cause,  Mother with htn,  3 brothers and 3 sisters.  Social History:  reports that he quit smoking about a year ago. His smoking use included Cigarettes. He has never used smokeless tobacco. He reports that he does not drink alcohol or use illicit drugs.  Over 30 pack year smoking history. No etoh in several years.  Retired from working at [employer] for many years.  Allergies: No Known Allergies  Medications: I have reviewed the patient's current  medications.  Home meds:   Amlodipine 10 mg daily Asa 81 mg daily Benazepril 20 mg daily Carvedilol 25 mg bid Fish oil one tablet daily Metformin 500 mg po bid Vit d 2000 iu daily cardura 2 mg daily  Results for orders placed during the hospital encounter of 11/15/11 (from the past 48 hour(s))  BASIC METABOLIC PANEL     Status: Abnormal   Collection Time   11/15/11 11:52 PM      Component Value Range Comment   Sodium 141  135 - 145 (mEq/L)    Potassium 4.7  3.5 - 5.1 (mEq/L)    Chloride 106  96 - 112 (mEq/L)    CO2 32  19 - 32 (mEq/L)    Glucose, Bld 163 (*) 70 - 99 (mg/dL)    BUN 25 (*) 6 - 23 (mg/dL)    Creatinine, Ser 1.61 (*) 0.50 - 1.35 (mg/dL)    Calcium 9.6  8.4 - 10.5 (mg/dL)    GFR calc non Af Amer 48 (*) >90 (mL/min)    GFR calc Af Amer 56 (*) >90 (mL/min)   CBC     Status: Abnormal   Collection Time   11/15/11 11:52 PM      Component Value Range Comment   WBC 14.7 (*) 4.0 - 10.5 (K/uL)  RBC 4.38  4.22 - 5.81 (MIL/uL)    Hemoglobin 12.9 (*) 13.0 - 17.0 (g/dL)    HCT 40.9  81.1 - 91.4 (%)    MCV 97.9  78.0 - 100.0 (fL)    MCH 29.5  26.0 - 34.0 (pg)    MCHC 30.1  30.0 - 36.0 (g/dL)    RDW 78.2  95.6 - 21.3 (%)    Platelets 228  150 - 400 (K/uL)   DIFFERENTIAL     Status: Abnormal   Collection Time   11/15/11 11:52 PM      Component Value Range Comment   Neutrophils Relative 80 (*) 43 - 77 (%)    Neutro Abs 11.7 (*) 1.7 - 7.7 (K/uL)    Lymphocytes Relative 10 (*) 12 - 46 (%)    Lymphs Abs 1.5  0.7 - 4.0 (K/uL)    Monocytes Relative 8  3 - 12 (%)    Monocytes Absolute 1.2 (*) 0.1 - 1.0 (K/uL)    Eosinophils Relative 2  0 - 5 (%)    Eosinophils Absolute 0.3  0.0 - 0.7 (K/uL)    Basophils Relative 0  0 - 1 (%)    Basophils Absolute 0.0  0.0 - 0.1 (K/uL)   PROTIME-INR     Status: Normal   Collection Time   11/15/11 11:52 PM      Component Value Range Comment   Prothrombin Time 14.6  11.6 - 15.2 (seconds)    INR 1.12  0.00 - 1.49    SURGICAL PCR SCREEN      Status: Abnormal   Collection Time   11/16/11  1:01 AM      Component Value Range Comment   MRSA, PCR POSITIVE (*) NEGATIVE     Staphylococcus aureus POSITIVE (*) NEGATIVE    GLUCOSE, CAPILLARY     Status: Abnormal   Collection Time   11/16/11  8:45 AM      Component Value Range Comment   Glucose-Capillary 138 (*) 70 - 99 (mg/dL)    Comment 1 Documented in Chart      Comment 2 Call MD NNP PA CNM     GRAM STAIN     Status: Normal   Collection Time   11/16/11 11:30 AM      Component Value Range Comment   Specimen Description ABSCESS SCALP      Special Requests CALL [id number]/ [id number]      Gram Stain        Value: NO WBC SEEN     GRAM POSITIVE COCCI FEW     Gram Stain Report Called to,Read Back By and Verified With: Lennox Pippins RN 11/16/11 1300 A. NAVARRO   Report Status 11/16/2011 FINAL     GLUCOSE, CAPILLARY     Status: Abnormal   Collection Time   11/16/11 12:52 PM      Component Value Range Comment   Glucose-Capillary 134 (*) 70 - 99 (mg/dL)    Comment 1 Documented in Chart      Comment 2 Notify RN     GLUCOSE, CAPILLARY     Status: Abnormal   Collection Time   11/16/11  4:50 PM      Component Value Range Comment   Glucose-Capillary 147 (*) 70 - 99 (mg/dL)     Dg Chest 2 View  69/62/9528  *RADIOLOGY REPORT*  Clinical Data: Preoperative chest radiograph; shortness of breath.  CHEST - 2 VIEW  Comparison: Chest radiograph performed 06/07/2007, and CT of the chest performed  06/01/2007  Findings: There is persistent or recurrent airspace opacity at the left lung base.  This is thought to reflect chronic atelectasis. Right lateral pleural thickening appears stable.  No significant pleural effusion or pneumothorax is seen.  The cardiomediastinal silhouette is mildly enlarged.  No acute osseous abnormalities are seen.  IMPRESSION:  1.  Persistent or recurrent airspace opacity at the left lung base is thought to reflect chronic atelectasis. 2.  Mild cardiomegaly.  Original Report  Authenticated By: Tonia Ghent, M.D.    QMV:HQIONG obtain as pt obtunded  Blood pressure 101/51, pulse 87, temperature 97.3 F (36.3 C), temperature source Axillary, resp. rate 17, height 5\' 10"  (1.778 m), weight 92.4 kg (203 lb 11.3 oz), SpO2 92.00%.  PHYSICAL EXAM:lying semi-supine . On bipap. Not arousable.  CTA bilaterally no w/r/a. RRR no m/r/g. abd soft, nt. Not distended. No signifcant edema in extremities.  No pallor or icterus. Colostomy site intact.  Assessment/Plan: Hypercapneic respiratory failure:  He is being intubated now. I have spoken with CCM and they will be directed this treatment. Scalp abscess with positive mrsa status.:  Continue antibiotics per surgical team.  Iv fluids. Colostomy: follow. DM2- monitor sugars. HTN- follow bp . Atelectasis: vs.pna follow. COPD:  He likely has underlying copd. I will follow with you.    Naaman Curro A 11/16/2011, 6:00 PM

## 2011-11-16 NOTE — Progress Notes (Signed)
eLink Physician-Brief Progress Note Patient Name: Philip [name] DOB: 1946-01-30 MRN: [id number]  Date of Service  11/16/2011   HPI/Events of Note  Intubated by anesthesia for hypercarbic resp failure. Asked by Dr Waynard Edwards to take over.   eICU Interventions  Vent,sedation , ABG, pCXR orders given   Intervention Category Major Interventions: Respiratory failure - evaluation and management  ALVA,RAKESH V. 11/16/2011, 6:25 PM

## 2011-11-16 NOTE — Anesthesia Postprocedure Evaluation (Signed)
  Anesthesia Post-op Note  Patient: Philip [name]  Procedure(s) Performed:  INCISION AND DRAINAGE ABSCESS - Inicision and drainage of scalp abcess  Patient Location: PACU  Anesthesia Type: MAC  Level of Consciousness: obtunded  Airway and Oxygen Therapy: Patient Spontanous Breathing  Post-op Pain: mild  Post-op Assessment: Post-op Vital signs reviewed, Patient's Cardiovascular Status Stable, Respiratory Function Stable, Patent Airway and No signs of Nausea or vomiting  Post-op Vital Signs: stable  Complications: Respiratory effort suboptimal in PACU. Bipap to be initiated, to stepdown overnight

## 2011-11-17 DIAGNOSIS — J9620 Acute and chronic respiratory failure, unspecified whether with hypoxia or hypercapnia: Secondary | ICD-10-CM | POA: Diagnosis present

## 2011-11-17 LAB — GLUCOSE, CAPILLARY
Glucose-Capillary: 91 mg/dL (ref 70–99)
Glucose-Capillary: 93 mg/dL (ref 70–99)

## 2011-11-17 LAB — CBC
MCH: 30 pg (ref 26.0–34.0)
MCHC: 30.2 g/dL (ref 30.0–36.0)
MCV: 99.5 fL (ref 78.0–100.0)
Platelets: 207 10*3/uL (ref 150–400)
RDW: 12.8 % (ref 11.5–15.5)

## 2011-11-17 LAB — COMPREHENSIVE METABOLIC PANEL
ALT: 24 U/L (ref 0–53)
AST: 22 U/L (ref 0–37)
Calcium: 9.6 mg/dL (ref 8.4–10.5)
Creatinine, Ser: 1.29 mg/dL (ref 0.50–1.35)
GFR calc Af Amer: 65 mL/min — ABNORMAL LOW (ref >90)
Sodium: 144 mEq/L (ref 135–145)
Total Protein: 7.2 g/dL (ref 6.0–8.3)

## 2011-11-17 LAB — BLOOD GAS, ARTERIAL
Drawn by: 232811
MECHVT: 580 mL
O2 Saturation: 98.4 %
PEEP: 5 cmH2O
Patient temperature: 98.6
RATE: 16 resp/min
pH, Arterial: 7.472 — ABNORMAL HIGH (ref 7.350–7.450)

## 2011-11-17 LAB — DIFFERENTIAL
Basophils Absolute: 0 10*3/uL (ref 0.0–0.1)
Eosinophils Absolute: 0 10*3/uL (ref 0.0–0.7)
Eosinophils Relative: 0 % (ref 0–5)

## 2011-11-17 MED ORDER — INSULIN ASPART 100 UNIT/ML ~~LOC~~ SOLN
0.0000 [IU] | SUBCUTANEOUS | Status: DC
Start: 2011-11-17 — End: 2011-11-21
  Administered 2011-11-17: 2 [IU] via SUBCUTANEOUS
  Administered 2011-11-18: 3 [IU] via SUBCUTANEOUS
  Administered 2011-11-19 (×2): 2 [IU] via SUBCUTANEOUS
  Filled 2011-11-17 (×3): qty 3

## 2011-11-17 MED ORDER — MUPIROCIN 2 % EX OINT
1.0000 "application " | TOPICAL_OINTMENT | Freq: Two times a day (BID) | CUTANEOUS | Status: AC
  Administered 2011-11-17 – 2011-11-21 (×10): 1 via NASAL
  Filled 2011-11-17: qty 22

## 2011-11-17 MED ORDER — ALBUTEROL SULFATE (5 MG/ML) 0.5% IN NEBU
2.5000 mg | INHALATION_SOLUTION | Freq: Four times a day (QID) | RESPIRATORY_TRACT | Status: DC
  Administered 2011-11-17 – 2011-11-22 (×16): 2.5 mg via RESPIRATORY_TRACT
  Filled 2011-11-17 (×17): qty 0.5

## 2011-11-17 MED ORDER — CHLORHEXIDINE GLUCONATE CLOTH 2 % EX PADS
6.0000 | MEDICATED_PAD | Freq: Every day | CUTANEOUS | Status: DC
  Administered 2011-11-17 – 2011-11-19 (×3): 6 via TOPICAL

## 2011-11-17 MED ORDER — PANTOPRAZOLE SODIUM 40 MG IV SOLR
40.0000 mg | INTRAVENOUS | Status: DC
  Administered 2011-11-17 – 2011-11-19 (×3): 40 mg via INTRAVENOUS
  Filled 2011-11-17 (×4): qty 40

## 2011-11-17 MED ORDER — ASPIRIN 81 MG PO CHEW
81.0000 mg | CHEWABLE_TABLET | Freq: Every day | ORAL | Status: DC
  Administered 2011-11-17 – 2011-11-22 (×6): 81 mg via ORAL
  Filled 2011-11-17 (×6): qty 1

## 2011-11-17 MED ORDER — IPRATROPIUM BROMIDE 0.02 % IN SOLN
0.5000 mg | Freq: Four times a day (QID) | RESPIRATORY_TRACT | Status: DC
  Administered 2011-11-17 – 2011-11-22 (×16): 0.5 mg via RESPIRATORY_TRACT
  Filled 2011-11-17 (×18): qty 2.5

## 2011-11-17 NOTE — Consult Note (Signed)
Name: Philip [name] MRN: [id number] DOB: 1946-04-02  DOS: 11/16/2011    LOS: 2  CRITICAL CARE CONSULTATION NOTE  History of Present Illness: 65 yo man admitted for I&D of recurrent scalp abscess, found unresponsive postoperatively with PaCO2=147 and pH=7.0.  Intubated.  PCCM called to co manage.  Lines / Drains: 11/27  OETT>>> 11/27  OGT>>>  Cultures / Sepsis markers: 11/27  MRSA PCR>>>pos 11/27  Abscess Cx>>>  Antibiotics: 11/27  Vancomycin 11/27  Zosym  Tests / Events: 11/27  Intubated for postoperative hypercarbic respiratory failure  Past Medical History  Diagnosis Date  . Alcohol abuse, in remission 2004  . Diabetes mellitus     Type II  . HTN (hypertension)   . Hyperlipidemia   . History of prostate cancer   . Vitamin D deficiency   . Tobacco abuse   . History of colostomy 2004  . History of MRSA infection 2009  . Intestinal ischemia 2004   Past Surgical History  Procedure Date  . 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 Colostomy    Prior to Admission medications   Medication Sig Start Date End Date Taking? Authorizing Provider  amLODipine (NORVASC) 5 MG tablet Take 5 mg by mouth daily.     Yes Historical Provider, MD  aspirin 81 MG tablet Take 81 mg by mouth daily.     Yes Historical Provider, MD  carvedilol (COREG) 25 MG tablet Take 25 mg by mouth 2 (two) times daily with a meal.     Yes Historical Provider, MD  cholecalciferol (VITAMIN D) 1000 UNITS tablet Take 1,000 Units by mouth daily.     Yes Historical Provider, MD  metFORMIN (GLUCOPHAGE) 500 MG tablet Take 500 mg by mouth 2 (two) times daily with a meal.     Yes Historical Provider, MD   No Known Allergies  History reviewed. No pertinent family history.  Social History  reports that he quit smoking about a year ago. His smoking use included Cigarettes. He has never used smokeless tobacco. He reports that he does not drink alcohol or use illicit drugs.  Review Of Systems  11 points review  of systems is negative with an exception of listed in HPI.  Vital Signs:  Reviewed  Physical Examination: Neuro:  Sedated, intubated HEENT:  ETT/OGT Heart:  RRR, no M/R/G Lungs:  Bilateral air entry, no W/R/R Abdomen:  Soft, non tender, colostomy Extremities: No edema  Labs and Imaging:  Reviewed.  Please refer to the Assessment and Plan section for relevant results.  Assessment and Plan: Postoperative hypercarbic respiratory failure  Lab 11/17/11 0020 11/16/11 1937 11/16/11 1800  PHART 7.472* 7.217* 7.001*  PCO2ART 39.0 71.4* 147.0*  PO2ART 80.6 61.3* 96.2  -->full support overnight -->Fentanyl/Versed sedation PRN -->SBT in AM  Scalp abscess, s/p I&D -->Vanco/Zosyn -->follow cultures  Diabetes / Hyperglycemia  Lab 11/16/11 2321 11/16/11 2012 11/16/11 1650 11/16/11 1252 11/16/11 0845  GLUCAP 115* 164* 147* 134* 138*  -->hold Metformin -->SSI+BCBGM  Hypertension, now normotensive -->continue Norvasc, Coreg, ASA  Best practices / Disposition: -->ICU status under CCS -->PCCM consulting -->full code -->SCDs for DVT Px -->Protonix for GI Px -->ventilator bundle -->NPO  The patient is critically ill with multiple organ systems failure and requires high complexity decision making for assessment and support, frequent evaluation and titration of therapies, application of advanced monitoring technologies and extensive interpretation of multiple databases. Critical Care Time devoted to patient care services described in this note is 35 minutes.  Orlean Bradford,  M.D. Pulmonary and Critical Care Medicine [facility] Cell: [phone]  11/17/2011, 12:38 AM

## 2011-11-17 NOTE — Progress Notes (Signed)
RT did not wean pt due to intubated less than 24 hrs- otherwise- meets wean guidlines

## 2011-11-17 NOTE — Progress Notes (Signed)
Patient ID: Philip [name], male   DOB: [birth year], 65 y.o.   MRN: [id number] Follow up - Critical Care Medicine Note  Patient Details:    Philip [name] is an 65 y.o. male. Patient Description  S/p scalp abscess drainage 11/27.  Postop resp failure   Lines/tubes Location Start Stop  ETT  11/27    Culture Date Result  Scalp abscess  11/28    Antibiotic Indication Start Stop  Zosyn vanco Scalp abscess same 11/27 11/27    GI Prophylaxis DVT Prophylaxis  PPI SCD     Protocols     Consultants       Date Events      Subjective:    Overnight Issues:   Overnight on vent.  Apneic with SBT Objective:  Vital signs  Temp:  [97.3 F (36.3 C)-98.9 F (37.2 C)] 98.9 F (37.2 C) (11/28 1200) Pulse Rate:  [83-110] 95  (11/28 1300) Resp:  [15-33] 16  (11/28 1300) BP: (99-149)/(51-77) 121/72 mmHg (11/28 1300) SpO2:  [89 %-100 %] 98 % (11/28 1300) FiO2 (%):  [35 %-60 %] 35 % (11/28 1300) Weight:  [92.7 kg (204 lb 5.9 oz)] 204 lb 5.9 oz (92.7 kg) (11/28 0000)  Hemodynamic parameters for last 24 hours:     Intake/Output Summary (Last 24 hours) at 11/17/11 1507 Last data filed at 11/17/11 1500  Gross per 24 hour  Intake   2635 ml  Output   2325 ml  Net    310 ml    Physical Exam:  Gen: orally intubated Neuro: sedated on vent,  Chest : exp wheeze, poor airflow Abd: soft nt Heent: scalp dressing in place Ext perfused   Ventilator  Settings: Vent Mode:  [-] PRVC FiO2 (%):  [35 %-60 %] 35 % Set Rate:  [16 bmp] 16 bmp Vt Set:  [580 mL] 580 mL PEEP:  [5 cmH20] 5 cmH20 Plateau Pressure:  [20 cmH20-28 cmH20] 21 cmH20  LAB RESULTS BMET    Component Value Date/Time   NA 144 11/17/2011 0315   K 4.0 11/17/2011 0315   CL 107 11/17/2011 0315   CO2 26 11/17/2011 0315   GLUCOSE 88 11/17/2011 0315   BUN 24* 11/17/2011 0315   CREATININE 1.29 11/17/2011 0315   CALCIUM 9.6 11/17/2011 0315   GFRNONAA 56* 11/17/2011 0315   GFRAA 65* 11/17/2011 0315   CBC      Component Value Date/Time   WBC 13.6* 11/17/2011 0315   RBC 4.13* 11/17/2011 0315   HGB 12.4* 11/17/2011 0315   HCT 41.1 11/17/2011 0315   PLT 207 11/17/2011 0315   MCV 99.5 11/17/2011 0315   MCH 30.0 11/17/2011 0315   MCHC 30.2 11/17/2011 0315   RDW 12.8 11/17/2011 0315   LYMPHSABS 1.0 11/17/2011 0315   MONOABS 1.2* 11/17/2011 0315   EOSABS 0.0 11/17/2011 0315   BASOSABS 0.0 11/17/2011 0315   ABG    Component Value Date/Time   PHART 7.472* 11/17/2011 0020   HCO3 28.2* 11/17/2011 0020   TCO2 25.1 11/17/2011 0020   ACIDBASEDEF 1.1 11/16/2011 1937   O2SAT 98.4 11/17/2011 0020    Additional lab data  Radiology Dg Chest 2 View  11/16/2011  *RADIOLOGY REPORT*  Clinical Data: Preoperative chest radiograph; shortness of breath.  CHEST - 2 VIEW  Comparison: Chest radiograph performed 06/07/2007, and CT of the chest performed 06/01/2007  Findings: There is persistent or recurrent airspace opacity at the left lung base.  This is thought to reflect chronic atelectasis. Right lateral  pleural thickening appears stable.  No significant pleural effusion or pneumothorax is seen.  The cardiomediastinal silhouette is mildly enlarged.  No acute osseous abnormalities are seen.  IMPRESSION:  1.  Persistent or recurrent airspace opacity at the left lung base is thought to reflect chronic atelectasis. 2.  Mild cardiomegaly.  Original Report Authenticated By: Tonia Ghent, M.D.   Dg Chest Port 1 View  11/16/2011  *RADIOLOGY REPORT*  Clinical Data: Chin.  PORTABLE CHEST - 1 VIEW  Comparison: Chest x-ray 11/16/2011.  Findings: The endotracheal tube is 13 mm above the carina.  This could be retracted 2 cm.  Persistent low lung volumes with vascular crowding, bibasilar atelectasis and probable small effusions.  IMPRESSION:  1.  The endotracheal tube is 13 mm above the carina and could be retracted 2 cm. 2.  Persistent low lung volumes with vascular crowding and bibasilar atelectasis.  Original Report  Authenticated By: P. Loralie Champagne, M.D.   Assessment/Plan:  Principal Problem:  *Abscess Per surgery  Active Problems:  HTN (hypertension) Monitor   Diabetes type 2, controlled SSI   Hypercapnic acidosis Maintain full vent supp   COPD (chronic obstructive pulmonary disease) Cont BDs   MRSA carrier Contact isolation   Respiratory failure, acute-on-chronic Full vent, repeat SBT 11/29   LOS: 2 days   Additional comments:None  Critical Care Total Time*: 45 Minutes  Shan Levans PCCM Service   Beeper  [phone]  Cell  [phone] Shan Levans 11/17/2011  *Care during the described time interval was provided by me and/or other providers on the critical care team.  I have reviewed this patient's available data, including medical history, events of note, physical examination and test results as part of my evaluation.

## 2011-11-17 NOTE — Progress Notes (Signed)
Patient ID: Philip [name], male   DOB: 1946-08-22, 65 y.o.   MRN: [id number] 1 Day Post-Op  Subjective: Pt intubated last night for hypercapnea, obtundation. Now very alert on vent.  Denies pain, SOB or other C/O  Objective: Vital signs in last 24 hours: Temp:  [97.3 F (36.3 C)-98.6 F (37 C)] 98.3 F (36.8 C) (11/28 0800) Pulse Rate:  [83-110] 96  (11/28 1000) Resp:  [15-33] 16  (11/28 1000) BP: (99-149)/(51-77) 131/73 mmHg (11/28 1000) SpO2:  [89 %-100 %] 98 % (11/28 1000) FiO2 (%):  [35 %-60 %] 35 % (11/28 1000) Weight:  [203 lb 11.3 oz (92.4 kg)-204 lb 5.9 oz (92.7 kg)] 204 lb 5.9 oz (92.7 kg) (11/28 0000) Last BM Date: 11/16/11  Intake/Output from previous day: 11/27 0701 - 11/28 0700 In: 2312.5 [I.V.:1300; IV Piggyback:1012.5] Out: 1800 [Urine:1525; Stool:200; Blood:75] Intake/Output this shift: Total I/O In: 397.5 [I.V.:300; NG/GT:60; IV Piggyback:37.5] Out: 300 [Urine:300]  General appearance: alert and no distress Resp: clear to auscultation bilaterally Incision/Wound:Necrotic base but decreased swelling and erythema, much improved from pre op  Lab Results:   [facility] 11/17/11 0315 11/15/11 2352  WBC 13.6* 14.7*  HGB 12.4* 12.9*  HCT 41.1 42.9  PLT 207 228   BMET  Basename 11/17/11 0315 11/15/11 2352  NA 144 141  K 4.0 4.7  CL 107 106  CO2 26 32  GLUCOSE 88 163*  BUN 24* 25*  CREATININE 1.29 1.46*  CALCIUM 9.6 9.6   PT/INR  Basename 11/15/11 2352  LABPROT 14.6  INR 1.12   ABG  Basename 11/17/11 0020 11/16/11 1937  PHART 7.472* 7.217*  HCO3 28.2* 28.3*    Studies/Results: Dg Chest 2 View  11/16/2011  *RADIOLOGY REPORT*  Clinical Data: Preoperative chest radiograph; shortness of breath.  CHEST - 2 VIEW  Comparison: Chest radiograph performed 06/07/2007, and CT of the chest performed 06/01/2007  Findings: There is persistent or recurrent airspace opacity at the left lung base.  This is thought to reflect chronic atelectasis. Right lateral  pleural thickening appears stable.  No significant pleural effusion or pneumothorax is seen.  The cardiomediastinal silhouette is mildly enlarged.  No acute osseous abnormalities are seen.  IMPRESSION:  1.  Persistent or recurrent airspace opacity at the left lung base is thought to reflect chronic atelectasis. 2.  Mild cardiomegaly.  Original Report Authenticated By: Tonia Ghent, M.D.   Dg Chest Port 1 View  11/16/2011  *RADIOLOGY REPORT*  Clinical Data: Chin.  PORTABLE CHEST - 1 VIEW  Comparison: Chest x-ray 11/16/2011.  Findings: The endotracheal tube is 13 mm above the carina.  This could be retracted 2 cm.  Persistent low lung volumes with vascular crowding, bibasilar atelectasis and probable small effusions.  IMPRESSION:  1.  The endotracheal tube is 13 mm above the carina and could be retracted 2 cm. 2.  Persistent low lung volumes with vascular crowding and bibasilar atelectasis.  Original Report Authenticated By: P. Loralie Champagne, M.D.    Anti-infectives: Anti-infectives     Start     Dose/Rate Route Frequency Ordered Stop   11/16/11 1600   vancomycin (VANCOCIN) 750 mg in sodium chloride 0.9 % 150 mL IVPB        750 mg 150 mL/hr over 60 Minutes Intravenous Every 12 hours 11/16/11 1541     11/16/11 1400  piperacillin-tazobactam (ZOSYN) IVPB 3.375 g       3.375 g 12.5 mL/hr over 240 Minutes Intravenous 3 times per day 11/16/11 1343  11/15/11 2330   Ampicillin-Sulbactam (UNASYN) 3 g in sodium chloride 0.9 % 100 mL IVPB  Status:  Discontinued        3 g 100 mL/hr over 60 Minutes Intravenous Every 6 hours 11/15/11 2307 11/16/11 1322          Assessment/Plan: s/p Procedure(s): INCISION AND DRAINAGE ABSCESS Wound improved, cont current wound care and abx.  Cxs pending VDRF, multifactorial, exacerbated by peri op sedation.  CCM following. Improved, appears he could be extubated today   LOS: 2 days    Braley Luckenbaugh T 11/17/2011

## 2011-11-17 NOTE — Progress Notes (Signed)
Patient ID: Philip [name], male   DOB: Aug 15, 1946, 65 y.o.   MRN: [id number] Subjective: Awake, alert. Follows commands. Nods that he is not in pain now.  Objective: Vital signs in last 24 hours: Temp:  [97.3 F (36.3 C)-99.2 F (37.3 C)] 99.2 F (37.3 C) (11/28 1600) Pulse Rate:  [94-110] 94  (11/28 1600) Resp:  [15-22] 17  (11/28 1600) BP: (99-149)/(61-77) 140/68 mmHg (11/28 1600) SpO2:  [89 %-100 %] 96 % (11/28 1600) FiO2 (%):  [35 %-60 %] 35 % (11/28 1600) Weight:  [92.7 kg (204 lb 5.9 oz)] 204 lb 5.9 oz (92.7 kg) (11/28 0000) Weight change: -1.041 kg (-2 lb 4.7 oz) Last BM Date: 11/16/11  Intake/Output from previous day: 11/27 0701 - 11/28 0700 In: 2312.5 [I.V.:1300; IV Piggyback:1012.5] Out: 1800 [Urine:1525; Stool:200; Blood:75] Intake/Output this shift: Total I/O In: 1397.5 [I.V.:1100; NG/GT:60; IV Piggyback:237.5] Out: 1350 [Urine:1200; Stool:150]  General appearance: alert and cooperative, on the ventilator. Resp: clear to auscultation bilaterally Cardio: regular rate and rhythm, S1, S2 normal, no murmur, click, rub or gallop GI: soft, non-tender; bowel sounds normal; no masses,  no organomegaly Extremities: extremities normal, atraumatic, no cyanosis or edema Neurologic: Grossly normal   Lab Results:  Basename 11/17/11 0315 11/15/11 2352  WBC 13.6* 14.7*  HGB 12.4* 12.9*  HCT 41.1 42.9  PLT 207 228   BMET  Basename 11/17/11 0315 11/15/11 2352  NA 144 141  K 4.0 4.7  CL 107 106  CO2 26 32  GLUCOSE 88 163*  BUN 24* 25*  CREATININE 1.29 1.46*  CALCIUM 9.6 9.6   CMET CMP     Component Value Date/Time   NA 144 11/17/2011 0315   K 4.0 11/17/2011 0315   CL 107 11/17/2011 0315   CO2 26 11/17/2011 0315   GLUCOSE 88 11/17/2011 0315   BUN 24* 11/17/2011 0315   CREATININE 1.29 11/17/2011 0315   CALCIUM 9.6 11/17/2011 0315   PROT 7.2 11/17/2011 0315   ALBUMIN 2.7* 11/17/2011 0315   AST 22 11/17/2011 0315   ALT 24 11/17/2011 0315   ALKPHOS 59  11/17/2011 0315   BILITOT 0.4 11/17/2011 0315   GFRNONAA 56* 11/17/2011 0315   GFRAA 65* 11/17/2011 0315     Studies/Results: Dg Chest 2 View  11/16/2011  *RADIOLOGY REPORT*  Clinical Data: Preoperative chest radiograph; shortness of breath.  CHEST - 2 VIEW  Comparison: Chest radiograph performed 06/07/2007, and CT of the chest performed 06/01/2007  Findings: There is persistent or recurrent airspace opacity at the left lung base.  This is thought to reflect chronic atelectasis. Right lateral pleural thickening appears stable.  No significant pleural effusion or pneumothorax is seen.  The cardiomediastinal silhouette is mildly enlarged.  No acute osseous abnormalities are seen.  IMPRESSION:  1.  Persistent or recurrent airspace opacity at the left lung base is thought to reflect chronic atelectasis. 2.  Mild cardiomegaly.  Original Report Authenticated By: Tonia Ghent, M.D.   Dg Chest Port 1 View  11/16/2011  *RADIOLOGY REPORT*  Clinical Data: Chin.  PORTABLE CHEST - 1 VIEW  Comparison: Chest x-ray 11/16/2011.  Findings: The endotracheal tube is 13 mm above the carina.  This could be retracted 2 cm.  Persistent low lung volumes with vascular crowding, bibasilar atelectasis and probable small effusions.  IMPRESSION:  1.  The endotracheal tube is 13 mm above the carina and could be retracted 2 cm. 2.  Persistent low lung volumes with vascular crowding and bibasilar atelectasis.  Original Report Authenticated  By: P. Loralie Champagne, M.D.    Medications: I have reviewed the patient's current medications.  Assessment/Plan:  Principal Problem:  *Abscess- on zosyn and vanco IV Active Problems:  HTN (hypertension)-stable.  Diabetes type 2, controlled-cbg's reasonable  Hypercapnic acidosis- care per CCM currently. Greatly appreciate their work.  COPD (chronic obstructive pulmonary disease)-likely has a signicant degree of this underlying.  MRSA carrier-vanco/contact isolate  Respiratory failure,  acute-on-chronic-per CCM Hopefully can come of ventilator soon.   LOS: 2 days   Ezequiel Kayser, MD 11/17/2011, 6:29 PM

## 2011-11-18 ENCOUNTER — Inpatient Hospital Stay (HOSPITAL_COMMUNITY): Payer: Medicare Other

## 2011-11-18 DIAGNOSIS — R7989 Other specified abnormal findings of blood chemistry: Secondary | ICD-10-CM

## 2011-11-18 DIAGNOSIS — L02818 Cutaneous abscess of other sites: Secondary | ICD-10-CM

## 2011-11-18 DIAGNOSIS — Z9911 Dependence on respirator [ventilator] status: Secondary | ICD-10-CM

## 2011-11-18 DIAGNOSIS — J9600 Acute respiratory failure, unspecified whether with hypoxia or hypercapnia: Secondary | ICD-10-CM

## 2011-11-18 DIAGNOSIS — L03818 Cellulitis of other sites: Secondary | ICD-10-CM

## 2011-11-18 LAB — GLUCOSE, CAPILLARY
Glucose-Capillary: 86 mg/dL (ref 70–99)
Glucose-Capillary: 88 mg/dL (ref 70–99)
Glucose-Capillary: 117 mg/dL — ABNORMAL HIGH (ref 70–99)
Glucose-Capillary: 169 mg/dL — ABNORMAL HIGH (ref 70–99)

## 2011-11-18 LAB — CBC
MCH: 29.8 pg (ref 26.0–34.0)
MCV: 95.8 fL (ref 78.0–100.0)
Platelets: 212 10*3/uL (ref 150–400)
RBC: 4.49 MIL/uL (ref 4.22–5.81)
RDW: 12.9 % (ref 11.5–15.5)
WBC: 10.1 10*3/uL (ref 4.0–10.5)

## 2011-11-18 LAB — BASIC METABOLIC PANEL
Calcium: 9.4 mg/dL (ref 8.4–10.5)
Creatinine, Ser: 1.24 mg/dL (ref 0.50–1.35)
GFR calc non Af Amer: 59 mL/min — ABNORMAL LOW (ref >90)
Sodium: 143 mEq/L (ref 135–145)

## 2011-11-18 MED ORDER — VORICONAZOLE 200 MG IV SOLR
550.0000 mg | Freq: Two times a day (BID) | INTRAVENOUS | Status: AC
  Administered 2011-11-18 – 2011-11-19 (×2): 550 mg via INTRAVENOUS
  Filled 2011-11-18 (×2): qty 550

## 2011-11-18 MED ORDER — VANCOMYCIN HCL IN DEXTROSE 1-5 GM/200ML-% IV SOLN
1000.0000 mg | Freq: Two times a day (BID) | INTRAVENOUS | Status: DC
  Filled 2011-11-18 (×4): qty 200

## 2011-11-18 MED ORDER — FENTANYL CITRATE 0.05 MG/ML IJ SOLN: 12.5000 ug | INTRAMUSCULAR | Status: DC | PRN

## 2011-11-18 MED ORDER — KCL IN DEXTROSE-NACL 40-5-0.45 MEQ/L-%-% IV SOLN
INTRAVENOUS | Status: DC
  Administered 2011-11-18: 15:00:00 via INTRAVENOUS
  Filled 2011-11-18 (×4): qty 1000

## 2011-11-18 MED ORDER — VORICONAZOLE 200 MG IV SOLR
350.0000 mg | Freq: Two times a day (BID) | INTRAVENOUS | Status: DC
  Filled 2011-11-18 (×2): qty 350

## 2011-11-18 NOTE — Procedures (Signed)
Extubation Procedure Note  Patient Details:   Name: Philip [name] DOB: 29-Apr-1946 MRN: [id number]   Airway Documentation:  AIRWAYS (Active)     AIRWAYS 8 mm (Active)  Secured at (cm) 22 cm 11/16/2011 12:00 AM    Evaluation  O2 sats: 94 Complications: none  tolerate procedure well, placed on 3 l/m via [place] Bilateral Breath Sounds: Diminished;Clear     Bailea Beed 11/18/2011, 12:33 PM

## 2011-11-18 NOTE — Progress Notes (Signed)
Follow up - Critical Care Medicine Note   Patient ID: Philip [name], male   DOB: 1946-12-09, 65 y.o.   MRN: [id number]   Patient Details:    Philip [name] is an 65 y.o. male. Patient Description  S/p scalp abscess drainage 11/27.  Postop resp failure   Lines/tubes Location Start Stop  ETT  11/27 11/29   Culture Date Result  Scalp abscess  11/28 Staph aureus Sens pending)   Antibiotic Indication Start Stop  Zosyn vanco Scalp abscess same 11/27 11/27    GI Prophylaxis DVT Prophylaxis  PPI SCD     Protocols     Consultants       Date Events      Subjective:    Overnight Issues:   Passed SBT. Cognition intact. Now extubated and looks good. Strong cough Objective:  Vital signs  Temp:  [98.2 F (36.8 C)-99.2 F (37.3 C)] 98.3 F (36.8 C) (11/29 1200) Pulse Rate:  [77-105] 92  (11/29 1200) Resp:  [16-25] 25  (11/29 1200) BP: (121-145)/(68-84) 124/72 mmHg (11/29 1200) SpO2:  [93 %-99 %] 98 % (11/29 1200) FiO2 (%):  [0.4 %-35 %] 30 % (11/29 0950) Weight:  [89.5 kg (197 lb 5 oz)] 197 lb 5 oz (89.5 kg) (11/29 0400)     Intake/Output Summary (Last 24 hours) at 11/18/11 1255 Last data filed at 11/18/11 1200  Gross per 24 hour  Intake   2540 ml  Output   2350 ml  Net    190 ml    Physical Exam:  Gen: NAD Neuro: Intact  Chest : scattered rhonchi Abd: soft nt, nabs Heent: scalp dressing in place Ext well-perfused, no edema  LAB RESULTS BMET    Component Value Date/Time   NA 143 11/18/2011 0315   K 3.6 11/18/2011 0315   CL 103 11/18/2011 0315   CO2 29 11/18/2011 0315   GLUCOSE 98 11/18/2011 0315   BUN 16 11/18/2011 0315   CREATININE 1.24 11/18/2011 0315   CALCIUM 9.4 11/18/2011 0315   GFRNONAA 59* 11/18/2011 0315   GFRAA 68* 11/18/2011 0315   CBC    Component Value Date/Time   WBC 10.1 11/18/2011 0315   RBC 4.49 11/18/2011 0315   HGB 13.4 11/18/2011 0315   HCT 43.0 11/18/2011 0315   PLT 212 11/18/2011 0315   MCV 95.8 11/18/2011 0315   MCH  29.8 11/18/2011 0315   MCHC 31.2 11/18/2011 0315   RDW 12.9 11/18/2011 0315   LYMPHSABS 1.0 11/17/2011 0315   MONOABS 1.2* 11/17/2011 0315   EOSABS 0.0 11/17/2011 0315   BASOSABS 0.0 11/17/2011 0315   ABG    Component Value Date/Time   PHART 7.472* 11/17/2011 0020   HCO3 28.2* 11/17/2011 0020   TCO2 25.1 11/17/2011 0020   ACIDBASEDEF 1.1 11/16/2011 1937   O2SAT 98.4 11/17/2011 0020    Additional lab data  Radiology CXR: CM, Bibasilar atx vs infiltrates Assessment/Plan:  Principal Problem:  *Abscess Per surgery Cont current abx pending sensitivities  Active Problems:  HTN (hypertension) Monitor   Diabetes type 2, controlled SSI   Hypercapnic acidosis Extubated. Cont BDs. Monitor   COPD (chronic obstructive pulmonary disease) Cont BDs   MRSA carrier Contact isolation   Respiratory failure, acute-on-chronic Full vent, repeat SBT 11/29   LOS: 3 days   Additional comments:None  Critical Care Total Time*: 35  Billy Fischer 11/18/2011  *Care during the described time interval was provided by me and/or other providers on the critical care team.  I  have reviewed this patient's available data, including medical history, events of note, physical examination and test results as part of my evaluation.

## 2011-11-18 NOTE — Progress Notes (Signed)
Courtesy note:  All events noted and labs/vitals reviewed.  Patient comfortable.  Still on ventilator.  Since  I have not been writing a lot of active orders I will sign off for now but will  Follow his progress.  Once he comes out of unit okay to place on my service if desired.   Please call if closer following while in ICU needed. Philip [name] [employer]

## 2011-11-18 NOTE — Progress Notes (Signed)
Patient ID: Philip [name], male   DOB: 1946-05-10, 65 y.o.   MRN: [id number] 2 Days Post-Op  Subjective: Pt awake and alert on vent.  Nods appropriately.  No complaints of pain.  Objective: Vital signs in last 24 hours: Temp:  [98.2 F (36.8 C)-99.2 F (37.3 C)] 98.6 F (37 C) (11/29 0800) Pulse Rate:  [77-105] 91  (11/29 0800) Resp:  [16-18] 18  (11/29 0800) BP: (106-145)/(64-84) 139/84 mmHg (11/29 0800) SpO2:  [93 %-99 %] 94 % (11/29 0900) FiO2 (%):  [0.4 %-35 %] 30 % (11/29 0950) Weight:  [197 lb 5 oz (89.5 kg)] 197 lb 5 oz (89.5 kg) (11/29 0400) Last BM Date: 11/18/11  Intake/Output from previous day: 11/28 0701 - 11/29 0700 In: 2640 [I.V.:2300; NG/GT:90; IV Piggyback:250] Out: 2600 [Urine:2375; Stool:225] Intake/Output this shift: Total I/O In: 397.5 [I.V.:300; NG/GT:60; IV Piggyback:37.5] Out: 150 [Urine:150]  PE: Head: wound appearing to be cleaning up.  Still has some fibrin at base but also has some areas of pink granulation tissue starting to show.  No obviously purulent drainage.  No malodor.  Lab Results:   [facility] 11/18/11 0315 11/17/11 0315  WBC 10.1 13.6*  HGB 13.4 12.4*  HCT 43.0 41.1  PLT 212 207   BMET  Basename 11/18/11 0315 11/17/11 0315  NA 143 144  K 3.6 4.0  CL 103 107  CO2 29 26  GLUCOSE 98 88  BUN 16 24*  CREATININE 1.24 1.29  CALCIUM 9.4 9.6   PT/INR  Basename 11/15/11 2352  LABPROT 14.6  INR 1.12     Studies/Results: Dg Chest Port 1 View  11/18/2011  *RADIOLOGY REPORT*  Clinical Data: Evaluate endotracheal tube  PORTABLE CHEST - 1 VIEW  Comparison: 11/16/2011; 06/07/2007  Findings: Grossly unchanged cardiac silhouette and mediastinal contours.  Stable positioning of endotracheal tube with tips appear the carina.  Interval placement of an enteric tube with tip terminating inferior to the left hemidiaphragm.  Persistently decreased lung volumes with bibasilar opacities, left greater than right.  There is blunting of bilateral  costophrenic angles suggestive of small bilateral effusions.  No definite pneumothorax. Unchanged bones.  IMPRESSION: 1.  Interval placement of enteric tube with tip terminating inferior to the left hemidiaphragm.  Otherwise, stable position of support apparatus.  No supine evidence of pneumothorax. 2.  Persistently decreased lung volumes with bibasilar opacities and likely small effusions, left greater than right, possibly atelectasis though underlying infection is not excluded.  Original Report Authenticated By: Waynard Reeds, M.D.   Dg Chest Port 1 View  11/16/2011  *RADIOLOGY REPORT*  Clinical Data: Chin.  PORTABLE CHEST - 1 VIEW  Comparison: Chest x-ray 11/16/2011.  Findings: The endotracheal tube is 13 mm above the carina.  This could be retracted 2 cm.  Persistent low lung volumes with vascular crowding, bibasilar atelectasis and probable small effusions.  IMPRESSION:  1.  The endotracheal tube is 13 mm above the carina and could be retracted 2 cm. 2.  Persistent low lung volumes with vascular crowding and bibasilar atelectasis.  Original Report Authenticated By: P. Loralie Champagne, M.D.    Anti-infectives: Anti-infectives     Start     Dose/Rate Route Frequency Ordered Stop   11/16/11 1600   vancomycin (VANCOCIN) 750 mg in sodium chloride 0.9 % 150 mL IVPB        750 mg 150 mL/hr over 60 Minutes Intravenous Every 12 hours 11/16/11 1541     11/16/11 1400  piperacillin-tazobactam (ZOSYN) IVPB 3.375 g  3.375 g 12.5 mL/hr over 240 Minutes Intravenous 3 times per day 11/16/11 1343     11/15/11 2330   Ampicillin-Sulbactam (UNASYN) 3 g in sodium chloride 0.9 % 100 mL IVPB  Status:  Discontinued        3 g 100 mL/hr over 60 Minutes Intravenous Every 6 hours 11/15/11 2307 11/16/11 1322           Assessment/Plan  1. Scalp abscess, s/p I&D 2. VDRF, likely secondary to COPD  Plan: Cont BID dressing changes as it appears that his wound is continuing to improve Vent per CCM,  appreciate their assistance and Dr. Laurey Morale as well.   LOS: 3 days    Saud Bail E 11/18/2011

## 2011-11-18 NOTE — Progress Notes (Signed)
ANTIBIOTIC CONSULT NOTE  Pharmacy Consult for Vancomycin follow-up and Starting Voriconazole.  Indication: Staph scalp abscess/ possible sepsis  No Known Allergies  Patient Measurements: Height: 5\' 10"  (177.8 cm) Weight: 197 lb 5 oz (89.5 kg) IBW/kg (Calculated) : 73    Vital Signs: Temp: 97.5 F (36.4 C) (11/29 1600) Temp src: Oral (11/29 1600) BP: 124/68 mmHg (11/29 1600) Pulse Rate: 96  (11/29 1600) Intake/Output from previous day: 11/28 0701 - 11/29 0700 In: 2640 [I.V.:2300; NG/GT:90; IV Piggyback:250] Out: 2600 [Urine:2375; Stool:225] Intake/Output from this shift: Total I/O In: 997.5 [I.V.:550; NG/GT:60; IV Piggyback:387.5] Out: 775 [Urine:650; Stool:125]  Labs:  [facility] 11/18/11 0315 11/17/11 0315 11/15/11 2352  WBC 10.1 13.6* 14.7*  HGB 13.4 12.4* 12.9*  PLT 212 207 228  LABCREA -- -- --  CREATININE 1.24 1.29 1.46*   Estimated Creatinine Clearance: 66 ml/min (by C-G formula based on Cr of 1.24).  Basename 11/18/11 1530  VANCOTROUGH 11.6  VANCOPEAK --  VANCORANDOM --  GENTTROUGH --  GENTPEAK --  GENTRANDOM --  TOBRATROUGH --  TOBRAPEAK --  TOBRARND --  AMIKACINPEAK --  AMIKACINTROU --  AMIKACIN --     Microbiology: Recent Results (from the past 720 hour(s))  SURGICAL PCR SCREEN     Status: Abnormal   Collection Time   11/16/11  1:01 AM      Component Value Range Status Comment   MRSA, PCR POSITIVE (*) NEGATIVE  Final    Staphylococcus aureus POSITIVE (*) NEGATIVE  Final   ANAEROBIC CULTURE     Status: Normal (Preliminary result)   Collection Time   11/16/11 11:30 AM      Component Value Range Status Comment   Specimen Description ABSCESS SCALP   Final    Special Requests NONE   Final    Gram Stain PENDING   Incomplete    Culture     Final    Value: NO ANAEROBES ISOLATED; CULTURE IN PROGRESS FOR 5 DAYS   Report Status PENDING   Incomplete   CULTURE, ROUTINE-ABSCESS     Status: Normal (Preliminary result)   Collection Time   11/16/11  11:30 AM      Component Value Range Status Comment   Specimen Description ABSCESS SCALP   Final    Special Requests NONE   Final    Gram Stain     Final    Value: NO WBC SEEN     FEW GRAM POSITIVE COCCI     IN PAIRS IN CLUSTERS Gram Stain Report Called to,Read Back By and Verified With: Gram Stain Report Called to,Read Back By and Verified With: Hinely RN AT 1300 11/16/11 A NAVARRO Performed by [facility]   Culture     Final    Value: ABUNDANT STAPHYLOCOCCUS AUREUS     Note: RIFAMPIN AND GENTAMICIN SHOULD NOT BE USED AS SINGLE DRUGS FOR TREATMENT OF STAPH INFECTIONS.   Report Status PENDING   Incomplete   GRAM STAIN     Status: Normal   Collection Time   11/16/11 11:30 AM      Component Value Range Status Comment   Specimen Description ABSCESS SCALP   Final    Special Requests CALL [id number]/ [id number]   Final    Gram Stain     Final    Value: NO WBC SEEN     GRAM POSITIVE COCCI FEW     Gram Stain Report Called to,Read Back By and Verified With: Lennox Pippins RN 11/16/11 1300 A. NAVARRO   Report  Status 11/16/2011 FINAL   Final     Medical History: Past Medical History  Diagnosis Date  . Alcohol abuse, in remission 2004  . Diabetes mellitus     Type II  . HTN (hypertension)   . Hyperlipidemia   . History of prostate cancer   . Vitamin D deficiency   . Tobacco abuse   . History of colostomy 2004  . History of MRSA infection 2009  . Intestinal ischemia 2004    Medications:  Scheduled:     . albuterol  2.5 mg Nebulization Q6H  . amLODipine  5 mg Oral Daily  . antiseptic oral rinse  15 mL Mouth Rinse QID  . aspirin  81 mg Oral Daily  . carvedilol  25 mg Oral BID WC  . chlorhexidine  15 mL Mouth Rinse BID  . Chlorhexidine Gluconate Cloth  6 each Topical Q0600  . cholecalciferol  1,000 Units Oral Daily  . insulin aspart  0-15 Units Subcutaneous Q4H  . ipratropium  0.5 mg Nebulization Q6H  . mupirocin ointment  1 application Nasal BID  . pantoprazole (PROTONIX) IV  40  mg Intravenous Q24H  . piperacillin-tazobactam (ZOSYN)  IV  3.375 g Intravenous Q8H  . vancomycin  750 mg Intravenous Q12H   Infusions:     . dextrose 5 % and 0.45 % NaCl with KCl 40 mEq/L 50 mL/hr at 11/18/11 1508  . DISCONTD: sodium chloride 100 mL/hr at 11/17/11 0416   Assessment: 60 year olf male with staph scalp abscess with possible sepsis.  S/P I & D of abscess.  H/O MRSA infection. SCr has improved.  Vanc trough below goal. Adding empiric Voriconazole.  Goal of Therapy:  Vancomycin trough level 15-20 mcg/ml  Plan:  1.  Change vancomycin to 1000mg  IV q12h 2.  Voriconazole 6mg /kg IV q12h x 2 then 4mg /kg IV q12h. 3.  Recheck vancomycin trough level as appropriate. 4.  Follow renal function and culture data.   Philip [name] 11/18/2011,5:34 PM

## 2011-11-19 ENCOUNTER — Inpatient Hospital Stay (HOSPITAL_COMMUNITY): Payer: Medicare Other

## 2011-11-19 LAB — GLUCOSE, CAPILLARY
Glucose-Capillary: 87 mg/dL (ref 70–99)
Glucose-Capillary: 99 mg/dL (ref 70–99)
Glucose-Capillary: 146 mg/dL — ABNORMAL HIGH (ref 70–99)
Glucose-Capillary: 149 mg/dL — ABNORMAL HIGH (ref 70–99)
Glucose-Capillary: 103 mg/dL — ABNORMAL HIGH (ref 70–99)

## 2011-11-19 LAB — CULTURE, ROUTINE-ABSCESS: Gram Stain: NONE SEEN

## 2011-11-19 LAB — BASIC METABOLIC PANEL
Chloride: 102 mEq/L (ref 96–112)
GFR calc Af Amer: 63 mL/min — ABNORMAL LOW (ref >90)
GFR calc non Af Amer: 54 mL/min — ABNORMAL LOW (ref >90)
Potassium: 4 mEq/L (ref 3.5–5.1)

## 2011-11-19 MED ORDER — ALBUTEROL SULFATE (5 MG/ML) 0.5% IN NEBU
INHALATION_SOLUTION | RESPIRATORY_TRACT | Status: AC
  Administered 2011-11-19: 2.5 mg via RESPIRATORY_TRACT
  Filled 2011-11-19: qty 0.5

## 2011-11-19 MED ORDER — ENOXAPARIN SODIUM 40 MG/0.4ML ~~LOC~~ SOLN
40.0000 mg | SUBCUTANEOUS | Status: DC
  Administered 2011-11-19 – 2011-11-21 (×3): 40 mg via SUBCUTANEOUS
  Filled 2011-11-19 (×4): qty 0.4

## 2011-11-19 MED ORDER — SODIUM CHLORIDE 0.9 % IV SOLN
INTRAVENOUS | Status: DC
  Administered 2011-11-19 – 2011-11-20 (×2): via INTRAVENOUS

## 2011-11-19 MED ORDER — VANCOMYCIN HCL IN DEXTROSE 1-5 GM/200ML-% IV SOLN
1000.0000 mg | Freq: Two times a day (BID) | INTRAVENOUS | Status: DC
  Administered 2011-11-19 – 2011-11-20 (×4): 1000 mg via INTRAVENOUS
  Filled 2011-11-19 (×5): qty 200

## 2011-11-19 MED ORDER — FLORA-Q PO CAPS
1.0000 | ORAL_CAPSULE | Freq: Every day | ORAL | Status: DC
  Administered 2011-11-19 – 2011-11-22 (×4): 1 via ORAL
  Filled 2011-11-19 (×4): qty 1

## 2011-11-19 NOTE — Progress Notes (Signed)
Philip [name] is an 65 y.o. male. S/p scalp abscess drainage 11/27. Postop resp failure  Lines/tubes ETT 11/27>>>11/29  Microbiology/Sepsis markers: Scalp abscess 11/28: SA>>>  Anti-infectives:  Zosyn 11/27 (scalp abscess)>>> vanc 11/27 (scalp abscess)>>> vori 11/28>>>11/29 (no indication)  Events/studies  Subjective Ok ovenight, no sob, ok for floor tx  objective Vitals Temp:  [97.5 F (36.4 C)-98.3 F (36.8 C)] 97.8 F (36.6 C) (11/30 0800) Pulse Rate:  [83-101] 92  (11/30 0800) Resp:  [20-33] 22  (11/30 0800) BP: (106-136)/(47-97) 136/77 mmHg (11/30 0700) SpO2:  [91 %-98 %] 95 % (11/30 0858) Weight:  [90.9 kg (200 lb 6.4 oz)] 200 lb 6.4 oz (90.9 kg) (11/30 0400)   Intake/Output Summary (Last 24 hours) at 11/19/11 1031 Last data filed at 11/19/11 0930  Gross per 24 hour  Intake   1750 ml  Output   1725 ml  Net     25 ml   Physical Exam:  Gen: NAD  Neuro: Intact  Chest : scattered rhonchi  Abd: soft nt, nabs  Heent: scalp dressing in place  Ext well-perfused, no edema   PCXR Bilateral atelectasis. May be element of edema. Aeration improved. In comparison to prior film.   LAB RESULTS  Lab 11/19/11 0313 11/18/11 0315 11/17/11 0315  NA 138 143 144  K 4.0 3.6 4.0  CL 102 103 107  CO2 31 29 26   BUN 17 16 24*  CREATININE 1.33 1.24 1.29  GLUCOSE 95 98 88    Lab 11/18/11 0315 11/17/11 0315 11/15/11 2352  HGB 13.4 12.4* 12.9*  HCT 43.0 41.1 42.9  WBC 10.1 13.6* 14.7*  PLT 212 207 228        Impression/Plan  Scalp Abscess  S/p I&D on 11/27. Prelim culture data growing Staph aureus. Sensitivities still pending.  Plan: -see dash board. Narrow abx when culture sensitivities available  Respiratory failure, acute in setting what was likely residual anesthesia effect and atelectasis +/- underlying COPD. Extubated on 11/28. Clinically improved.  Rec: -change bd to PRN -cont pulm hygiene -wean O2  HTN (hypertension) (11/16/2011) Good  control Rec: -cont home rx.   Diabetes type 2, controlled (11/16/2011) CBG (last 3) Excellent control.   Basename 11/19/11 0734 11/19/11 0437 11/18/11 2354  GLUCAP 99 87 95  rec: -cont ssi  H/O  MRSA -cont mupirocin   Dispo/best practice PPI for SUP PAS for DVT prophylaxis  Advance diet Advance activity Transfer to medical ward. Med service to assume care.    Sandrea Hughs, MD Pulmonary and Critical Care Medicine [facility] Cell [phone]

## 2011-11-19 NOTE — Progress Notes (Signed)
Patient examined and agree with above Jared Cahn T Sharnita Bogucki MD, FACS  11/19/2011, 6:52 PM  

## 2011-11-19 NOTE — Progress Notes (Signed)
Patient ID: Philip [name], male   DOB: 08-Jul-1946, 65 y.o.   MRN: [id number] 3 Days Post-Op  Subjective: No C/O, no SOB or pain  Objective: Vital signs in last 24 hours: Temp:  [97.5 F (36.4 C)-98.3 F (36.8 C)] 97.8 F (36.6 C) (11/30 0800) Pulse Rate:  [83-101] 92  (11/30 0800) Resp:  [20-33] 22  (11/30 0800) BP: (106-136)/(47-97) 136/77 mmHg (11/30 0700) SpO2:  [91 %-98 %] 95 % (11/30 0858) FiO2 (%):  [30 %] 30 % (11/29 0950) Weight:  [200 lb 6.4 oz (90.9 kg)] 200 lb 6.4 oz (90.9 kg) (11/30 0400) Last BM Date: 11/19/11 (ileostomy)  Intake/Output from previous day: 11/29 0701 - 11/30 0700 In: 1965 [I.V.:1300; NG/GT:60; IV Piggyback:605] Out: 1775 [Urine:1475; Stool:300] Intake/Output this shift: Total I/O In: 182.5 [P.O.:120; I.V.:50; IV Piggyback:12.5] Out: -   General appearance: alert and no distress Resp: clear to auscultation bilaterally Incision/Wound:Scalp wound improving, minimal exudate at base  Lab Results:   Basename 11/18/11 0315 11/17/11 0315  WBC 10.1 13.6*  HGB 13.4 12.4*  HCT 43.0 41.1  PLT 212 207   BMET  Basename 11/19/11 0313 11/18/11 0315  NA 138 143  K 4.0 3.6  CL 102 103  CO2 31 29  GLUCOSE 95 98  BUN 17 16  CREATININE 1.33 1.24  CALCIUM 9.0 9.4   PT/INR No results found for this basename: LABPROT:2,INR:2 in the last 72 hours ABG  Basename 11/17/11 0020 11/16/11 1937  PHART 7.472* 7.217*  HCO3 28.2* 28.3*    Studies/Results: Dg Chest Port 1 View  11/19/2011  *RADIOLOGY REPORT*  Clinical Data: Respiratory failure.  PORTABLE CHEST - 1 VIEW  Comparison: 11/18/2011.  Findings: Endotracheal tube and nasogastric tube have been removed.  Bibasilar air space disease has improved slightly.  What remains may represent a small infiltrates/atelectasis.  Small left-sided pleural effusion not excluded.  Cardiomegaly.  Pulmonary vascular congestion most notable centrally.  No gross pneumothorax.  IMPRESSION: Endotracheal tube and nasogastric  tube removed.  Slight improved aeration lung bases as noted above.  Original Report Authenticated By: Fuller Canada, M.D.   Dg Chest Port 1 View  11/18/2011  *RADIOLOGY REPORT*  Clinical Data: Evaluate endotracheal tube  PORTABLE CHEST - 1 VIEW  Comparison: 11/16/2011; 06/07/2007  Findings: Grossly unchanged cardiac silhouette and mediastinal contours.  Stable positioning of endotracheal tube with tips appear the carina.  Interval placement of an enteric tube with tip terminating inferior to the left hemidiaphragm.  Persistently decreased lung volumes with bibasilar opacities, left greater than right.  There is blunting of bilateral costophrenic angles suggestive of small bilateral effusions.  No definite pneumothorax. Unchanged bones.  IMPRESSION: 1.  Interval placement of enteric tube with tip terminating inferior to the left hemidiaphragm.  Otherwise, stable position of support apparatus.  No supine evidence of pneumothorax. 2.  Persistently decreased lung volumes with bibasilar opacities and likely small effusions, left greater than right, possibly atelectasis though underlying infection is not excluded.  Original Report Authenticated By: Waynard Reeds, M.D.   Cx wound- Staph Aureus  Anti-infectives: Anti-infectives     Start     Dose/Rate Route Frequency Ordered Stop   11/19/11 1747   voriconazole (VFEND) 350 mg in sodium chloride 0.9 % 150 mL IVPB        350 mg 138.7 mL/hr over 80 Minutes Intravenous Every 12 hours 11/18/11 1748     11/19/11 0200   vancomycin (VANCOCIN) IVPB 1000 mg/200 mL premix  1,000 mg 200 mL/hr over 60 Minutes Intravenous Every 12 hours 11/18/11 1748     11/18/11 1800   voriconazole (VFEND) 550 mg in sodium chloride 0.9 % 150 mL IVPB        550 mg 102.5 mL/hr over 120 Minutes Intravenous Every 12 hours 11/18/11 1748 11/19/11 0829   11/16/11 1600   vancomycin (VANCOCIN) 750 mg in sodium chloride 0.9 % 150 mL IVPB  Status:  Discontinued        750 mg 150  mL/hr over 60 Minutes Intravenous Every 12 hours 11/16/11 1541 11/18/11 1748   11/16/11 1400   piperacillin-tazobactam (ZOSYN) IVPB 3.375 g        3.375 g 12.5 mL/hr over 240 Minutes Intravenous 3 times per day 11/16/11 1343     11/15/11 2330   Ampicillin-Sulbactam (UNASYN) 3 g in sodium chloride 0.9 % 100 mL IVPB  Status:  Discontinued        3 g 100 mL/hr over 60 Minutes Intravenous Every 6 hours 11/15/11 2307 11/16/11 1322          Assessment/Plan: s/p Procedure(s): INCISION AND DRAINAGE ABSCESS Post op VDRF-resolved Scalp I&D-wound improving.  Cx Staph.  Cont Vanc, D/C Zosyn  LOS: 4 days    Philip [name] T 11/19/2011

## 2011-11-19 NOTE — Progress Notes (Addendum)
CRITICAL VALUE ALERT  Critical value received: MRSA positive in head abscess  Date of notification:  11/19/11  Time of notification:  1128  Critical value read back:yes  Nurse who received alert: Jonna Munro RN  MD notified (1st page):  Dr. Johna Sheriff   Time of first page:  1133  MD notified (2nd page):  Time of second page:  Responding MD:  Unknown Jim PA  Time MD responded:  1145 Patient already on vancomycin

## 2011-11-19 NOTE — Progress Notes (Addendum)
Patient ID: ANTONIE BORJON, male   DOB: June 01, 1946, 65 y.o.   MRN: [id number] Subjective: No new complaints. Extubated yesterday and has done well and has been transferred onto my service.  Objective: Vital signs in last 24 hours: Temp:  [97.6 F (36.4 C)-97.9 F (36.6 C)] 97.9 F (36.6 C) (11/30 1340) Pulse Rate:  [74-101] 87  (11/30 1340) Resp:  [19-33] 19  (11/30 1340) BP: (106-136)/(47-97) 134/76 mmHg (11/30 1340) SpO2:  [91 %-98 %] 95 % (11/30 1340) Weight:  [90.9 kg (200 lb 6.4 oz)] 200 lb 6.4 oz (90.9 kg) (11/30 0400) Weight change: 1.4 kg (3 lb 1.4 oz) Last BM Date: 11/19/11 (ileostomy)  Intake/Output from previous day: 11/29 0701 - 11/30 0700 In: 1965 [I.V.:1300; NG/GT:60; IV Piggyback:605] Out: 1775 [Urine:1475; Stool:300] Intake/Output this shift: Total I/O In: 607.5 [P.O.:120; I.V.:250; IV Piggyback:237.5] Out: 300 [Urine:300]  General appearance: alert, cooperative and appears stated age Resp: clear to auscultation bilaterally except decreased at bilateral bases. Cardio: regular rate and rhythm, S1, S2 normal, no murmur, click, rub or gallop GI: soft, non-tender; bowel sounds normal; no masses,  no organomegaly Extremities: extremities normal, atraumatic, no cyanosis or edema Neurologic: Grossly normal   Lab Results:  Basename 11/18/11 0315 11/17/11 0315  WBC 10.1 13.6*  HGB 13.4 12.4*  HCT 43.0 41.1  PLT 212 207   BMET  Basename 11/19/11 0313 11/18/11 0315  NA 138 143  K 4.0 3.6  CL 102 103  CO2 31 29  GLUCOSE 95 98  BUN 17 16  CREATININE 1.33 1.24  CALCIUM 9.0 9.4   CMET CMP     Component Value Date/Time   NA 138 11/19/2011 0313   K 4.0 11/19/2011 0313   CL 102 11/19/2011 0313   CO2 31 11/19/2011 0313   GLUCOSE 95 11/19/2011 0313   BUN 17 11/19/2011 0313   CREATININE 1.33 11/19/2011 0313   CALCIUM 9.0 11/19/2011 0313   PROT 7.2 11/17/2011 0315   ALBUMIN 2.7* 11/17/2011 0315   AST 22 11/17/2011 0315   ALT 24 11/17/2011 0315   ALKPHOS  59 11/17/2011 0315   BILITOT 0.4 11/17/2011 0315   GFRNONAA 54* 11/19/2011 0313   GFRAA 63* 11/19/2011 0313     Studies/Results: Dg Chest Port 1 View  11/19/2011  *RADIOLOGY REPORT*  Clinical Data: Respiratory failure.  PORTABLE CHEST - 1 VIEW  Comparison: 11/18/2011.  Findings: Endotracheal tube and nasogastric tube have been removed.  Bibasilar air space disease has improved slightly.  What remains may represent a small infiltrates/atelectasis.  Small left-sided pleural effusion not excluded.  Cardiomegaly.  Pulmonary vascular congestion most notable centrally.  No gross pneumothorax.  IMPRESSION: Endotracheal tube and nasogastric tube removed.  Slight improved aeration lung bases as noted above.  Original Report Authenticated By: Fuller Canada, M.D.   Dg Chest Port 1 View  11/18/2011  *RADIOLOGY REPORT*  Clinical Data: Evaluate endotracheal tube  PORTABLE CHEST - 1 VIEW  Comparison: 11/16/2011; 06/07/2007  Findings: Grossly unchanged cardiac silhouette and mediastinal contours.  Stable positioning of endotracheal tube with tips appear the carina.  Interval placement of an enteric tube with tip terminating inferior to the left hemidiaphragm.  Persistently decreased lung volumes with bibasilar opacities, left greater than right.  There is blunting of bilateral costophrenic angles suggestive of small bilateral effusions.  No definite pneumothorax. Unchanged bones.  IMPRESSION: 1.  Interval placement of enteric tube with tip terminating inferior to the left hemidiaphragm.  Otherwise, stable position of support apparatus.  No  supine evidence of pneumothorax. 2.  Persistently decreased lung volumes with bibasilar opacities and likely small effusions, left greater than right, possibly atelectasis though underlying infection is not excluded.  Original Report Authenticated By: Waynard Reeds, M.D.    Medications: I have reviewed the patient's current medications.  Assessment/Plan:  Principal  Problem:  *Abscess-continue vanco and wound care. It appears zosyn was stopped but I think that is probably okay at this point.  kvo ns iv.  Add lovenox for dvt prophylaxis. Active Problems:  HTN (hypertension)-follow  Diabetes type 2, controlled-may resume metformin soon  Hypercapnic acidosis-resolved.  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 Will need a few more days of antibiotics. Plan home mid to late next week at this rate. Continue P.T. And O.T.   LOS: 4 days   Ezequiel Kayser, MD 11/19/2011, 5:34 PM

## 2011-11-20 LAB — CBC
Hemoglobin: 14.3 g/dL (ref 13.0–17.0)
MCH: 30.4 pg (ref 26.0–34.0)
MCHC: 32.5 g/dL (ref 30.0–36.0)
MCV: 93.4 fL (ref 78.0–100.0)
Platelets: 222 10*3/uL (ref 150–400)
RBC: 4.71 MIL/uL (ref 4.22–5.81)

## 2011-11-20 LAB — GLUCOSE, CAPILLARY
Glucose-Capillary: 110 mg/dL — ABNORMAL HIGH (ref 70–99)
Glucose-Capillary: 117 mg/dL — ABNORMAL HIGH (ref 70–99)

## 2011-11-20 LAB — DIFFERENTIAL
Basophils Relative: 0 % (ref 0–1)
Eosinophils Absolute: 0.5 10*3/uL (ref 0.0–0.7)
Eosinophils Relative: 5 % (ref 0–5)
Lymphs Abs: 1.3 10*3/uL (ref 0.7–4.0)
Monocytes Relative: 7 % (ref 3–12)

## 2011-11-20 LAB — COMPREHENSIVE METABOLIC PANEL
BUN: 12 mg/dL (ref 6–23)
Calcium: 9.3 mg/dL (ref 8.4–10.5)
GFR calc Af Amer: 78 mL/min — ABNORMAL LOW (ref >90)
Glucose, Bld: 95 mg/dL (ref 70–99)
Total Protein: 7.6 g/dL (ref 6.0–8.3)

## 2011-11-20 NOTE — Progress Notes (Signed)
OT Note:  Pt screened for OT.  He was up standing at sink doing ADL when I arrived. I checked back and he states he was able to complete this on his own and he feels near baseline.  He and mother live together and help each other.  He states they have all DME.  Screen only.  [place], OTR/L [phone] 11/20/2011

## 2011-11-20 NOTE — Progress Notes (Signed)
PT Screen Pt reports he is having no problems with walking.  PT deferred Ebony Hail, PT

## 2011-11-20 NOTE — Progress Notes (Signed)
4 Days Post-Op  Subjective: No issues overnight.  Objective: Vital signs in last 24 hours: Temp:  [97.6 F (36.4 C)-98.5 F (36.9 C)] 98.5 F (36.9 C) (12/01 0600) Pulse Rate:  [74-89] 87  (12/01 0600) Resp:  [19-24] 20  (12/01 0600) BP: (124-148)/(76-82) 148/79 mmHg (12/01 0600) SpO2:  [90 %-96 %] 90 % (12/01 0600) Last BM Date: 12/19/11  Intake/Output from previous day: 11/30 0701 - 12/01 0700 In: 1617.5 [P.O.:680; I.V.:700; IV Piggyback:237.5] Out: 2500 [Urine:2100; Stool:400] Intake/Output this shift:    General appearance: alert, cooperative and no distress Head: Normocephalic, without obvious abnormality, atraumatic, wound with some fibrinous tissue at base but otherwise healing well.  cellulitis improved. no residual purulence  Lab Results:   Basename 11/20/11 0430 11/18/11 0315  WBC 9.1 10.1  HGB 14.3 13.4  HCT 44.0 43.0  PLT 222 212   BMET  Basename 11/20/11 0430 11/19/11 0313  NA 139 138  K 3.7 4.0  CL 105 102  CO2 27 31  GLUCOSE 95 95  BUN 12 17  CREATININE 1.11 1.33  CALCIUM 9.3 9.0   PT/INR No results found for this basename: LABPROT:2,INR:2 in the last 72 hours ABG No results found for this basename: PHART:2,PCO2:2,PO2:2,HCO3:2 in the last 72 hours  Studies/Results: Dg Chest Port 1 View  11/19/2011  *RADIOLOGY REPORT*  Clinical Data: Respiratory failure.  PORTABLE CHEST - 1 VIEW  Comparison: 11/18/2011.  Findings: Endotracheal tube and nasogastric tube have been removed.  Bibasilar air space disease has improved slightly.  What remains may represent a small infiltrates/atelectasis.  Small left-sided pleural effusion not excluded.  Cardiomegaly.  Pulmonary vascular congestion most notable centrally.  No gross pneumothorax.  IMPRESSION: Endotracheal tube and nasogastric tube removed.  Slight improved aeration lung bases as noted above.  Original Report Authenticated By: Fuller Canada, M.D.    Anti-infectives: Anti-infectives     Start      Dose/Rate Route Frequency Ordered Stop   11/19/11 1747   voriconazole (VFEND) 350 mg in sodium chloride 0.9 % 150 mL IVPB  Status:  Discontinued        350 mg 138.7 mL/hr over 80 Minutes Intravenous Every 12 hours 11/18/11 1748 11/19/11 1054   11/19/11 1100   vancomycin (VANCOCIN) IVPB 1000 mg/200 mL premix        1,000 mg 200 mL/hr over 60 Minutes Intravenous Every 12 hours 11/19/11 1048     11/19/11 0200   vancomycin (VANCOCIN) IVPB 1000 mg/200 mL premix  Status:  Discontinued        1,000 mg 200 mL/hr over 60 Minutes Intravenous Every 12 hours 11/18/11 1748 11/19/11 1048   11/18/11 1800   voriconazole (VFEND) 550 mg in sodium chloride 0.9 % 150 mL IVPB        550 mg 102.5 mL/hr over 120 Minutes Intravenous Every 12 hours 11/18/11 1748 11/19/11 0829   11/16/11 1600   vancomycin (VANCOCIN) 750 mg in sodium chloride 0.9 % 150 mL IVPB  Status:  Discontinued        750 mg 150 mL/hr over 60 Minutes Intravenous Every 12 hours 11/16/11 1541 11/18/11 1748   11/16/11 1400   piperacillin-tazobactam (ZOSYN) IVPB 3.375 g  Status:  Discontinued        3.375 g 12.5 mL/hr over 240 Minutes Intravenous 3 times per day 11/16/11 1343 11/19/11 0957   11/15/11 2330   Ampicillin-Sulbactam (UNASYN) 3 g in sodium chloride 0.9 % 100 mL IVPB  Status:  Discontinued  3 g 100 mL/hr over 60 Minutes Intravenous Every 6 hours 11/15/11 2307 11/16/11 1322          Assessment/Plan: s/p Procedure(s): INCISION AND DRAINAGE ABSCESS daily wound care.  will need to set up home health but otherwise to home when wound care set up and other medical issues resolved  LOS: 5 days    Philip [name] DAVID 11/20/2011

## 2011-11-20 NOTE — Progress Notes (Signed)
Patient has no new complaints today. He relates that he is breathing well. By mouth intake is excellent. Wound is being attended to by nursing. He continues on vancomycin and Zosyn.  Examination  BP 148/79  Pulse 87  Temp(Src) 98.5 F (36.9 C) (Oral)  Resp 20  Ht 5\' 10"  (1.778 m)  Wt 90.9 kg (200 lb 6.4 oz)  BMI 28.75 kg/m2  SpO2 87% Patient is awake alert conversant. He is bright alert and interactive. He is taken a breathing treatment at this time. Lungs are clear to auscultation percussion. Cardiovascular regular rate and rhythm no murmur. Abdomen is soft nontender ostomy is draining. Extremities without cyanosis clubbing or edema he is wearing PAS stockings. Neurologically he is grossly normal.  Data  Results for orders placed during the hospital encounter of 11/15/11 (from the past 24 hour(s))  GLUCOSE, CAPILLARY     Status: Abnormal   Collection Time   11/19/11 11:43 AM      Component Value Range   Glucose-Capillary 146 (*) 70 - 99 (mg/dL)   Comment 1 Notify RN     Comment 2 Documented in Chart    GLUCOSE, CAPILLARY     Status: Abnormal   Collection Time   11/19/11  4:28 PM      Component Value Range   Glucose-Capillary 149 (*) 70 - 99 (mg/dL)   Comment 1 Documented in Chart     Comment 2 Notify RN    GLUCOSE, CAPILLARY     Status: Abnormal   Collection Time   11/19/11  7:56 PM      Component Value Range   Glucose-Capillary 103 (*) 70 - 99 (mg/dL)   Comment 1 Notify RN     Comment 2 Documented in Chart    GLUCOSE, CAPILLARY     Status: Abnormal   Collection Time   11/20/11 12:14 AM      Component Value Range   Glucose-Capillary 110 (*) 70 - 99 (mg/dL)  CBC     Status: Normal   Collection Time   11/20/11  4:30 AM      Component Value Range   WBC 9.1  4.0 - 10.5 (K/uL)   RBC 4.71  4.22 - 5.81 (MIL/uL)   Hemoglobin 14.3  13.0 - 17.0 (g/dL)   HCT 91.4  78.2 - 95.6 (%)   MCV 93.4  78.0 - 100.0 (fL)   MCH 30.4  26.0 - 34.0 (pg)   MCHC 32.5  30.0 - 36.0 (g/dL)   RDW 21.3  08.6 - 57.8 (%)   Platelets 222  150 - 400 (K/uL)  DIFFERENTIAL     Status: Normal   Collection Time   11/20/11  4:30 AM      Component Value Range   Neutrophils Relative 74  43 - 77 (%)   Neutro Abs 6.7  1.7 - 7.7 (K/uL)   Lymphocytes Relative 14  12 - 46 (%)   Lymphs Abs 1.3  0.7 - 4.0 (K/uL)   Monocytes Relative 7  3 - 12 (%)   Monocytes Absolute 0.6  0.1 - 1.0 (K/uL)   Eosinophils Relative 5  0 - 5 (%)   Eosinophils Absolute 0.5  0.0 - 0.7 (K/uL)   Basophils Relative 0  0 - 1 (%)   Basophils Absolute 0.0  0.0 - 0.1 (K/uL)  COMPREHENSIVE METABOLIC PANEL     Status: Abnormal   Collection Time   11/20/11  4:30 AM      Component Value Range  Sodium 139  135 - 145 (mEq/L)   Potassium 3.7  3.5 - 5.1 (mEq/L)   Chloride 105  96 - 112 (mEq/L)   CO2 27  19 - 32 (mEq/L)   Glucose, Bld 95  70 - 99 (mg/dL)   BUN 12  6 - 23 (mg/dL)   Creatinine, Ser 0.98  0.50 - 1.35 (mg/dL)   Calcium 9.3  8.4 - 11.9 (mg/dL)   Total Protein 7.6  6.0 - 8.3 (g/dL)   Albumin 2.6 (*) 3.5 - 5.2 (g/dL)   AST 22  0 - 37 (U/L)   ALT 16  0 - 53 (U/L)   Alkaline Phosphatase 52  39 - 117 (U/L)   Total Bilirubin 0.3  0.3 - 1.2 (mg/dL)   GFR calc non Af Amer 67 (*) >90 (mL/min)   GFR calc Af Amer 78 (*) >90 (mL/min)  GLUCOSE, CAPILLARY     Status: Normal   Collection Time   11/20/11  7:31 AM      Component Value Range   Glucose-Capillary 87  70 - 99 (mg/dL)     Assessment  #1 scalp abscess status post incision and drainage as she does continue on antibiotics at this point  #2 essential hypertension stable and well compensated  #3 diabetes mellitus type 2 stable with blood sugar 82 low 100s  #4 COPD currently stable with bronchodilators  Plan continue wound care, antibiotics, pulmonary toilet

## 2011-11-21 LAB — DIFFERENTIAL
Basophils Absolute: 0 10*3/uL (ref 0.0–0.1)
Basophils Relative: 0 % (ref 0–1)
Eosinophils Relative: 5 % (ref 0–5)
Monocytes Absolute: 0.5 10*3/uL (ref 0.1–1.0)
Monocytes Relative: 6 % (ref 3–12)

## 2011-11-21 LAB — COMPREHENSIVE METABOLIC PANEL
AST: 26 U/L (ref 0–37)
Albumin: 2.7 g/dL — ABNORMAL LOW (ref 3.5–5.2)
BUN: 14 mg/dL (ref 6–23)
Calcium: 9.6 mg/dL (ref 8.4–10.5)
Creatinine, Ser: 1.13 mg/dL (ref 0.50–1.35)
GFR calc non Af Amer: 66 mL/min — ABNORMAL LOW (ref >90)
Total Bilirubin: 0.2 mg/dL — ABNORMAL LOW (ref 0.3–1.2)

## 2011-11-21 LAB — CBC
HCT: 42.9 % (ref 39.0–52.0)
Hemoglobin: 13.2 g/dL (ref 13.0–17.0)
MCH: 29.6 pg (ref 26.0–34.0)
MCHC: 30.8 g/dL (ref 30.0–36.0)
MCV: 96.2 fL (ref 78.0–100.0)
RDW: 12.9 % (ref 11.5–15.5)

## 2011-11-21 LAB — VANCOMYCIN, TROUGH: Vancomycin Tr: 17.9 ug/mL (ref 10.0–20.0)

## 2011-11-21 LAB — ANAEROBIC CULTURE

## 2011-11-21 LAB — GLUCOSE, CAPILLARY: Glucose-Capillary: 93 mg/dL (ref 70–99)

## 2011-11-21 MED ORDER — INSULIN ASPART 100 UNIT/ML ~~LOC~~ SOLN
0.0000 [IU] | Freq: Three times a day (TID) | SUBCUTANEOUS | Status: DC
  Filled 2011-11-21: qty 3

## 2011-11-21 MED ORDER — INSULIN ASPART 100 UNIT/ML ~~LOC~~ SOLN
0.0000 [IU] | Freq: Every day | SUBCUTANEOUS | Status: DC
  Filled 2011-11-21: qty 3

## 2011-11-21 MED ORDER — DOXYCYCLINE HYCLATE 100 MG PO TABS
100.0000 mg | ORAL_TABLET | Freq: Two times a day (BID) | ORAL | Status: DC
  Administered 2011-11-21 – 2011-11-22 (×3): 100 mg via ORAL
  Filled 2011-11-21 (×4): qty 1

## 2011-11-21 NOTE — Progress Notes (Signed)
5 Days Post-Op  Subjective: No new issues  Objective: Vital signs in last 24 hours: Temp:  [97.3 F (36.3 C)-97.5 F (36.4 C)] 97.3 F (36.3 C) (12/02 0535) Pulse Rate:  [84-89] 86  (12/02 0535) Resp:  [18-20] 18  (12/02 0535) BP: (120-148)/(73-87) 137/85 mmHg (12/02 0535) SpO2:  [87 %-93 %] 93 % (12/02 0535) Last BM Date: 11/20/11  Intake/Output from previous day: 12/01 0701 - 12/02 0700 In: 2149.3 [P.O.:1080; I.V.:469.3; IV Piggyback:600] Out: 2465 [Urine:1925; Stool:540] Intake/Output this shift:    General appearance: alert, cooperative and no distress Incision/Wound: wound healing well, dressing changed, fibrinous exudate at the base but cellulitis improved.    Lab Results:   Basename 11/21/11 0441 11/20/11 0430  WBC 8.1 9.1  HGB 13.2 14.3  HCT 42.9 44.0  PLT 215 222   BMET  Basename 11/21/11 0441 11/20/11 0430  NA 141 139  K 4.3 3.7  CL 105 105  CO2 31 27  GLUCOSE 99 95  BUN 14 12  CREATININE 1.13 1.11  CALCIUM 9.6 9.3   PT/INR No results found for this basename: LABPROT:2,INR:2 in the last 72 hours ABG No results found for this basename: PHART:2,PCO2:2,PO2:2,HCO3:2 in the last 72 hours  Studies/Results: No results found.  Anti-infectives: Anti-infectives     Start     Dose/Rate Route Frequency Ordered Stop   11/19/11 1747   voriconazole (VFEND) 350 mg in sodium chloride 0.9 % 150 mL IVPB  Status:  Discontinued        350 mg 138.7 mL/hr over 80 Minutes Intravenous Every 12 hours 11/18/11 1748 11/19/11 1054   11/19/11 1100   vancomycin (VANCOCIN) IVPB 1000 mg/200 mL premix        1,000 mg 200 mL/hr over 60 Minutes Intravenous Every 12 hours 11/19/11 1048     11/19/11 0200   vancomycin (VANCOCIN) IVPB 1000 mg/200 mL premix  Status:  Discontinued        1,000 mg 200 mL/hr over 60 Minutes Intravenous Every 12 hours 11/18/11 1748 11/19/11 1048   11/18/11 1800   voriconazole (VFEND) 550 mg in sodium chloride 0.9 % 150 mL IVPB        550 mg 102.5  mL/hr over 120 Minutes Intravenous Every 12 hours 11/18/11 1748 11/19/11 0829   11/16/11 1600   vancomycin (VANCOCIN) 750 mg in sodium chloride 0.9 % 150 mL IVPB  Status:  Discontinued        750 mg 150 mL/hr over 60 Minutes Intravenous Every 12 hours 11/16/11 1541 11/18/11 1748   11/16/11 1400   piperacillin-tazobactam (ZOSYN) IVPB 3.375 g  Status:  Discontinued        3.375 g 12.5 mL/hr over 240 Minutes Intravenous 3 times per day 11/16/11 1343 11/19/11 0957   11/15/11 2330   Ampicillin-Sulbactam (UNASYN) 3 g in sodium chloride 0.9 % 100 mL IVPB  Status:  Discontinued        3 g 100 mL/hr over 60 Minutes Intravenous Every 6 hours 11/15/11 2307 11/16/11 1322          Assessment/Plan: s/p Procedure(s): INCISION AND DRAINAGE ABSCESS Plan for discharge tomorrow, should be okay for discharge when home health arranged and other health issues resolved.  LOS: 6 days    Lodema Pilot DAVID 11/21/2011

## 2011-11-21 NOTE — Progress Notes (Signed)
CM spoke with pt concerning d/c planning. Per pt lives with mother. Previously active with [facility]. Sticky note entered for MD to enter resume HHRN orders upon discharged. Per pt no other HH services or DME required. T.Davidson Palmieri [phone]

## 2011-11-21 NOTE — Progress Notes (Signed)
Subjective: Philip [name] is sitting up on the edge of the bed and [place] surgery is redressing the wound. He evidently lives with his mother he relates that with the proper assistance and education as well as home health they should be able to manage this wound. He has been eating well. He's been ambulating.  Objective: Vital signs in last 24 hours: Temp:  [97.3 F (36.3 C)-97.5 F (36.4 C)] 97.3 F (36.3 C) (12/02 0535) Pulse Rate:  [84-89] 86  (12/02 0535) Resp:  [18-20] 18  (12/02 0535) BP: (120-148)/(73-87) 137/85 mmHg (12/02 0535) SpO2:  [87 %-93 %] 93 % (12/02 0535) Weight change:   CBG (last 3)   Basename 11/20/11 2156 11/20/11 1611 11/20/11 1148  GLUCAP 117* 104* 119*    Intake/Output from previous day: 12/01 0701 - 12/02 0700 In: 2149.3 [P.O.:1080; I.V.:469.3; IV Piggyback:600] Out: 2465 [Urine:1925; Stool:540]  Physical Exam: Patient is awake alert interactive. He has good facial symmetry. Posterior scalp lesion has a clean base with no purulence is currently being packed by the surgeon. Lungs are clear area cardiovascular exam regular rate and rhythm no murmur. Abdomen is benign ostomy is patent and draining. Extremities are without edema he does have intact pulses and is wearing PAS stockings. Neurologically he is intact.   Lab Results:  [facility] 11/21/11 0441 11/20/11 0430  NA 141 139  K 4.3 3.7  CL 105 105  CO2 31 27  GLUCOSE 99 95  BUN 14 12  CREATININE 1.13 1.11  CALCIUM 9.6 9.3  MG -- --  PHOS -- --    Basename 11/21/11 0441 11/20/11 0430  AST 26 22  ALT 19 16  ALKPHOS 51 52  BILITOT 0.2* 0.3  PROT 7.9 7.6  ALBUMIN 2.7* 2.6*    Basename 11/21/11 0441 11/20/11 0430  WBC 8.1 9.1  NEUTROABS 6.1 6.7  HGB 13.2 14.3  HCT 42.9 44.0  MCV 96.2 93.4  PLT 215 222   Lab Results  Component Value Date   INR 1.12 11/15/2011   INR 1.0 07/26/2008   INR 1.1 05/29/2007   No results found for this basename: CKTOTAL:3,CKMB:3,CKMBINDEX:3,TROPONINI:3  in the last 72 hours No results found for this basename: TSH,T4TOTAL,FREET3,T3FREE,THYROIDAB in the last 72 hours No results found for this basename: VITAMINB12:2,FOLATE:2,FERRITIN:2,TIBC:2,IRON:2,RETICCTPCT:2 in the last 72 hours  Studies/Results: No results found.   Assessment/Plan: #1 scalp abscess status post incision and drainage currently clean with no signs of secondary infection or systemic infection. We'll convert to oral antibiotics at this point per discussion with the surgical team.  #2 essential hypertension well compensated  #3 diabetes mellitus type 2 with stable blood sugars  #4 COPD currently stable  Plan conversant oral antibiotics and prepare for discharge tomorrow.  LOS: 6 days   Philip [name] A 11/21/2011, 8:32 AM

## 2011-11-22 LAB — CBC
HCT: 43.6 % (ref 39.0–52.0)
MCHC: 31.2 g/dL (ref 30.0–36.0)
MCV: 95.2 fL (ref 78.0–100.0)
Platelets: 251 10*3/uL (ref 150–400)
RDW: 12.6 % (ref 11.5–15.5)
WBC: 8.5 10*3/uL (ref 4.0–10.5)

## 2011-11-22 LAB — COMPREHENSIVE METABOLIC PANEL
ALT: 38 U/L (ref 0–53)
AST: 50 U/L — ABNORMAL HIGH (ref 0–37)
Albumin: 3.1 g/dL — ABNORMAL LOW (ref 3.5–5.2)
Calcium: 10.2 mg/dL (ref 8.4–10.5)
Creatinine, Ser: 1.15 mg/dL (ref 0.50–1.35)
GFR calc non Af Amer: 65 mL/min — ABNORMAL LOW (ref >90)
Sodium: 143 mEq/L (ref 135–145)
Total Protein: 8.4 g/dL — ABNORMAL HIGH (ref 6.0–8.3)

## 2011-11-22 LAB — DIFFERENTIAL
Basophils Absolute: 0 10*3/uL (ref 0.0–0.1)
Basophils Relative: 0 % (ref 0–1)
Eosinophils Absolute: 0.3 10*3/uL (ref 0.0–0.7)
Eosinophils Relative: 4 % (ref 0–5)
Lymphocytes Relative: 16 % (ref 12–46)
Monocytes Absolute: 0.5 10*3/uL (ref 0.1–1.0)

## 2011-11-22 LAB — GLUCOSE, CAPILLARY: Glucose-Capillary: 102 mg/dL — ABNORMAL HIGH (ref 70–99)

## 2011-11-22 MED ORDER — DOXYCYCLINE HYCLATE 100 MG PO TABS: 100.0000 mg | ORAL_TABLET | Freq: Two times a day (BID) | ORAL | Status: AC

## 2011-11-22 MED ORDER — FLORA-Q PO CAPS: 1.0000 | ORAL_CAPSULE | Freq: Every day | ORAL | Status: DC

## 2011-11-22 MED ORDER — ALBUTEROL SULFATE (2.5 MG/3ML) 0.083% IN NEBU: 2.5000 mg | INHALATION_SOLUTION | Freq: Four times a day (QID) | RESPIRATORY_TRACT | Status: DC | PRN

## 2011-11-22 NOTE — Progress Notes (Signed)
Patient ID: Philip [name], male   DOB: 18-Jan-1946, 65 y.o.   MRN: [id number] Subjective: He has no complaints.  Objective: Vital signs in last 24 hours: Temp:  [97.4 F (36.3 C)-98 F (36.7 C)] 97.4 F (36.3 C) (12/03 0545) Pulse Rate:  [82-90] 90  (12/03 0545) Resp:  [18-22] 18  (12/03 0545) BP: (130-142)/(74-88) 137/88 mmHg (12/03 0545) SpO2:  [88 %-97 %] 97 % (12/03 0545) Weight change:  Last BM Date: 11/21/11  Intake/Output from previous day: 12/02 0701 - 12/03 0700 In: 600 [P.O.:600] Out: 2275 [Urine:1725; Stool:550] Intake/Output this shift:    General appearance: alert and cooperative Resp: clear to auscultation bilaterally Cardio: regular rate and rhythm, S1, S2 normal, no murmur, click, rub or gallop GI: soft, non-tender; bowel sounds normal; no masses,  no organomegaly Extremities: extremities normal, atraumatic, no cyanosis or edema Neurologic: Grossly normal, scalp wound is packed. No redness around the border.   Lab Results:  Basename 11/22/11 0510 11/21/11 0441  WBC 8.5 8.1  HGB 13.6 13.2  HCT 43.6 42.9  PLT 251 215   BMET  Basename 11/22/11 0510 11/21/11 0441  NA 143 141  K 4.9 4.3  CL 104 105  CO2 36* 31  GLUCOSE 96 99  BUN 15 14  CREATININE 1.15 1.13  CALCIUM 10.2 9.6   CMET CMP     Component Value Date/Time   NA 143 11/22/2011 0510   K 4.9 11/22/2011 0510   CL 104 11/22/2011 0510   CO2 36* 11/22/2011 0510   GLUCOSE 96 11/22/2011 0510   BUN 15 11/22/2011 0510   CREATININE 1.15 11/22/2011 0510   CALCIUM 10.2 11/22/2011 0510   PROT 8.4* 11/22/2011 0510   ALBUMIN 3.1* 11/22/2011 0510   AST 50* 11/22/2011 0510   ALT 38 11/22/2011 0510   ALKPHOS 55 11/22/2011 0510   BILITOT 0.3 11/22/2011 0510   GFRNONAA 65* 11/22/2011 0510   GFRAA 75* 11/22/2011 0510   CBG (last 3)   Basename 11/21/11 2152 11/21/11 1705 11/21/11 1231  GLUCAP 93 93 93      Studies/Results: No results found.  Medications: I have reviewed the patient's current  medications.  Assessment/Plan:  Principal Problem:  *Abscess-improving.   D/C home with wound care and complete another week of doxycycline. Active Problems:  HTN (hypertension)-stable.  Diabetes type 2, controlled-stable  Hypercapnic acidosis-resolved  COPD (chronic obstructive pulmonary disease)-he does not smoke. Set up outpatient pft's and go from there.  MRSA carrier-follow  Respiratory failure, acute-on-chronic-improved.   LOS: 7 days   Ezequiel Kayser, MD 11/22/2011, 7:20 AM

## 2011-11-22 NOTE — Progress Notes (Signed)
CM received referral.  See [facility] note in shadow chart. 

## 2011-11-22 NOTE — Progress Notes (Signed)
Patient ID: Philip [name], male   DOB: 01-19-1946, 65 y.o.   MRN: [id number] 6 Days Post-Op  Subjective: Pt feels well.  No c/o  Objective: Vital signs in last 24 hours: Temp:  [97.4 F (36.3 C)-98 F (36.7 C)] 97.4 F (36.3 C) (12/03 0545) Pulse Rate:  [82-90] 90  (12/03 0545) Resp:  [18-22] 18  (12/03 0545) BP: (130-142)/(74-88) 137/88 mmHg (12/03 0545) SpO2:  [88 %-97 %] 97 % (12/03 0545) Last BM Date: 11/21/11  Intake/Output from previous day: 12/02 0701 - 12/03 0700 In: 600 [P.O.:600] Out: 2275 [Urine:1725; Stool:550] Intake/Output this shift: Total I/O In: 360 [P.O.:360] Out: 325 [Urine:325]  PE: Head: wound is clean.  Mild fibrin at base, but otherwise clean.  Lab Results:   Basename 11/22/11 0510 11/21/11 0441  WBC 8.5 8.1  HGB 13.6 13.2  HCT 43.6 42.9  PLT 251 215   BMET  Basename 11/22/11 0510 11/21/11 0441  NA 143 141  K 4.9 4.3  CL 104 105  CO2 36* 31  GLUCOSE 96 99  BUN 15 14  CREATININE 1.15 1.13  CALCIUM 10.2 9.6   PT/INR No results found for this basename: LABPROT:2,INR:2 in the last 72 hours   Studies/Results: No results found.  Anti-infectives: Anti-infectives     Start     Dose/Rate Route Frequency Ordered Stop   11/22/11 0000   doxycycline (VIBRA-TABS) 100 MG tablet        100 mg Oral Every 12 hours 11/22/11 0733 12/02/11 2359   11/21/11 1000   doxycycline (VIBRA-TABS) tablet 100 mg        100 mg Oral Every 12 hours 11/21/11 0837     11/19/11 1747   voriconazole (VFEND) 350 mg in sodium chloride 0.9 % 150 mL IVPB  Status:  Discontinued        350 mg 138.7 mL/hr over 80 Minutes Intravenous Every 12 hours 11/18/11 1748 11/19/11 1054   11/19/11 1100   vancomycin (VANCOCIN) IVPB 1000 mg/200 mL premix  Status:  Discontinued        1,000 mg 200 mL/hr over 60 Minutes Intravenous Every 12 hours 11/19/11 1048 11/21/11 0837   11/19/11 0200   vancomycin (VANCOCIN) IVPB 1000 mg/200 mL premix  Status:  Discontinued        1,000 mg 200  mL/hr over 60 Minutes Intravenous Every 12 hours 11/18/11 1748 11/19/11 1048   11/18/11 1800   voriconazole (VFEND) 550 mg in sodium chloride 0.9 % 150 mL IVPB        550 mg 102.5 mL/hr over 120 Minutes Intravenous Every 12 hours 11/18/11 1748 11/19/11 0829   11/16/11 1600   vancomycin (VANCOCIN) 750 mg in sodium chloride 0.9 % 150 mL IVPB  Status:  Discontinued        750 mg 150 mL/hr over 60 Minutes Intravenous Every 12 hours 11/16/11 1541 11/18/11 1748   11/16/11 1400   piperacillin-tazobactam (ZOSYN) IVPB 3.375 g  Status:  Discontinued        3.375 g 12.5 mL/hr over 240 Minutes Intravenous 3 times per day 11/16/11 1343 11/19/11 0957   11/15/11 2330   Ampicillin-Sulbactam (UNASYN) 3 g in sodium chloride 0.9 % 100 mL IVPB  Status:  Discontinued        3 g 100 mL/hr over 60 Minutes Intravenous Every 6 hours 11/15/11 2307 11/16/11 1322           Assessment/Plan  1. Scalp abscess  Plan: Arrange HH Stable for D/C home Follow  up with surgeon in 2 weeks.   LOS: 7 days    Cherissa Hook E 11/22/2011

## 2011-11-22 NOTE — Progress Notes (Signed)
Agree with above 

## 2011-11-25 NOTE — Discharge Summary (Signed)
Physician Discharge Summary  Patient ID: Philip [name] MRN: [id number] DOB/AGE: [birth year] 64 y.o.  Admit date: 11/15/2011 Discharge date: 11/25/2011  Admission Diagnoses: Scalp abscess  Discharge Diagnoses:  Principal Problem:  *Abscess Active Problems:  HTN (hypertension)  Diabetes type 2, controlled  Hypercapnic acidosis  COPD (chronic obstructive pulmonary disease)  MRSA carrier  Respiratory failure, acute-on-chronic Ventilator Dependent Respiratory failure-resolved Possible Obstructive Sleep apnea Ileostomy   Discharged Condition: fair  Hospital Course: Philip [name] was admitted with a large scalp abscess.  He was placed on antibiotics and taken to the operating room.  He was placed no broad spectrum antibiotics including vancomycin for mrsa positivity.  Post operatively he remained obtunded and abg showed severe retention of co2 with severe respiratory acidosis requiring intubation and mechanical ventilation.  It was not totally clear if he may have had some bibasilar pneumonia but that was never a predominant part of his hospital course.  He was extubated two days later and did well.   Wound care was ongoing to the scalp wound which appeared clean.  He was transitioned to oral antibiotics and discharged home in stable condition. While he was in the ICU there was some question that he may have had OSA so an outpt sleep study is being arranged as well.  Consults: pulmonary/intensive care  Significant Diagnostic Studies: labs:  CBC    Component Value Date/Time   WBC 8.5 11/22/2011 0510   RBC 4.58 11/22/2011 0510   HGB 13.6 11/22/2011 0510   HCT 43.6 11/22/2011 0510   PLT 251 11/22/2011 0510   MCV 95.2 11/22/2011 0510   MCH 29.7 11/22/2011 0510   MCHC 31.2 11/22/2011 0510   RDW 12.6 11/22/2011 0510   LYMPHSABS 1.4 11/22/2011 0510   MONOABS 0.5 11/22/2011 0510   EOSABS 0.3 11/22/2011 0510   BASOSABS 0.0 11/22/2011 0510    BMET    Component Value Date/Time   NA 143 11/22/2011 0510   K 4.9 11/22/2011 0510   CL 104 11/22/2011 0510   CO2 36* 11/22/2011 0510   GLUCOSE 96 11/22/2011 0510   BUN 15 11/22/2011 0510   CREATININE 1.15 11/22/2011 0510   CALCIUM 10.2 11/22/2011 0510   GFRNONAA 65* 11/22/2011 0510   GFRAA 75* 11/22/2011 0510      Treatments: zosyn and vancomycin  Discharge Exam:  Blood pressure 108/70, pulse 90, temperature 97.4 F (36.3 C), temperature source Oral, resp. rate 18, height 5\' 10"  (1.778 m), weight 90.9 kg (200 lb 6.4 oz), SpO2 97.00%.  Physical Exam:He was alert and oriented. He was ambulating well. Scalp wound dressed. CTA no w/r/r RRR no m/r/g Abd soft /nT Ileostomy site intact. No edema.   Disposition:Discharge to Home  Discharge Orders    Future Orders Please Complete By Expires   Ambulatory referral to Home Health      Comments:   Please evaluate [name] for admission to [facility].  Disciplines requested: Nursing  Home health nurse for wound care per surgery instructions. Services to provide: Other: wound care  Physician to follow patient's care (the person listed here will be responsible for signing ongoing orders): PCP  Requested Start of Care Date: Today (Please call ahead to confirm availability [phone])  Special Instructions:   Diet - low sodium heart healthy      Increase activity slowly      Discharge instructions      Comments:   Continue wound care per surgery and home health nurse. Take the 10 days of doxycycline.  Other Restrictions      Comments:   Increase activity slowly. Call for any problems.   Discharge wound care:      Comments:   Per home health nurse and surgery instructions.     Discharge Medication List as of 11/22/2011 11:24 AM    START taking these medications   Details  albuterol (PROVENTIL) (2.5 MG/3ML) 0.083% nebulizer solution Take 3 mLs (2.5 mg total) by nebulization every 6 (six) hours as needed for wheezing., Starting 11/22/2011, Until Tue 11/21/12, Normal    doxycycline  (VIBRA-TABS) 100 MG tablet Take 1 tablet (100 mg total) by mouth every 12 (twelve) hours., Starting 11/22/2011, Until Thu 12/02/11, Normal    Flora-Q (FLORA-Q) CAPS Take 1 capsule by mouth daily., Starting 11/22/2011, Until Discontinued, Normal      CONTINUE these medications which have NOT CHANGED   Details  amLODipine (NORVASC) 5 MG tablet Take 5 mg by mouth daily.  , Until Discontinued, Historical Med    aspirin 81 MG tablet Take 81 mg by mouth daily.  , Until Discontinued, Historical Med    carvedilol (COREG) 25 MG tablet Take 25 mg by mouth 2 (two) times daily with a meal.  , Until Discontinued, Historical Med    cholecalciferol (VITAMIN D) 1000 UNITS tablet Take 1,000 Units by mouth daily.  , Until Discontinued, Historical Med    metFORMIN (GLUCOPHAGE) 500 MG tablet Take 500 mg by mouth 2 (two) times daily with a meal.  , Until Discontinued, Historical Med       Follow-up Information    Follow up with HOXWORTH,BENJAMIN T, MD in 2 weeks.   Contact information:   [employer] [address] [place] [address] [phone]          Signed: Ezequiel Kayser 11/25/2011, 11:48 AM

## 2011-12-03 ENCOUNTER — Encounter (INDEPENDENT_AMBULATORY_CARE_PROVIDER_SITE_OTHER): Payer: Self-pay | Admitting: General Surgery

## 2011-12-03 ENCOUNTER — Ambulatory Visit (INDEPENDENT_AMBULATORY_CARE_PROVIDER_SITE_OTHER): Payer: Medicare Other | Admitting: General Surgery

## 2011-12-03 VITALS — BP 122/75 | HR 80 | Temp 97.0°F | Resp 14 | Ht 70.0 in | Wt 198.4 lb

## 2011-12-03 DIAGNOSIS — L0291 Cutaneous abscess, unspecified: Secondary | ICD-10-CM

## 2011-12-03 DIAGNOSIS — L0390 Cellulitis, unspecified: Secondary | ICD-10-CM

## 2011-12-03 NOTE — Progress Notes (Signed)
Subjective:     Patient ID: Philip [name], male   DOB: 11-08-1946, 65 y.o.   MRN: [id number]  HPI Patient follows up status post I&D and debridement of scalp abscess by Dr. Johna Sheriff a few weeks ago. He has been having home health do the dressing changes daily and denies any pain. Review of Systems     Objective:   Physical Exam The wound is healing well without sign of infection. He does have 1 cm area of dry, and dead tissue in the center of the wound which I debrided today. The tissue is nice healthy granulation tissue.     Assessment:     Status post I&D of scalp abscess-doing well    Plan:     Continue daily dressing changes and strict control of glucose. Followup in one month for followup wound check

## 2012-01-06 ENCOUNTER — Ambulatory Visit (INDEPENDENT_AMBULATORY_CARE_PROVIDER_SITE_OTHER): Payer: Medicare Other | Admitting: General Surgery

## 2012-01-06 VITALS — BP 120/88 | HR 80 | Resp 16 | Ht 70.0 in | Wt 209.0 lb

## 2012-01-06 DIAGNOSIS — Z09 Encounter for follow-up examination after completed treatment for conditions other than malignant neoplasm: Secondary | ICD-10-CM

## 2012-01-06 NOTE — Progress Notes (Signed)
Patient returns to the office after hospitalization for incision, drainage, and debridement of a large carbuncle of the scalp. At this point he is feeling well.  On examination the wound has essentially completely healed with just about a half centimeter superficial central scab and no evidence of infection.  Assessment and plan: Doing well following above with essentially healed wound. I encouraged them to call for any signs of recurrent infection and he will return as needed the

## 2012-08-25 ENCOUNTER — Other Ambulatory Visit: Payer: Self-pay | Admitting: Internal Medicine

## 2012-08-25 DIAGNOSIS — R143 Flatulence: Secondary | ICD-10-CM

## 2012-08-28 ENCOUNTER — Ambulatory Visit
Admission: RE | Admit: 2012-08-28 | Discharge: 2012-08-28 | Disposition: A | Discharge status: Home / Self Care | Payer: Medicare Other | Source: Ambulatory Visit | Attending: Internal Medicine | Admitting: Internal Medicine

## 2012-08-28 DIAGNOSIS — R141 Gas pain: Secondary | ICD-10-CM

## 2012-08-28 DIAGNOSIS — R143 Flatulence: Secondary | ICD-10-CM

## 2014-05-25 ENCOUNTER — Emergency Department (HOSPITAL_COMMUNITY)
Admission: EM | Admit: 2014-05-25 | Discharge: 2014-05-25 | Disposition: A | Discharge status: Home / Self Care | Payer: Medicare Other | Attending: Emergency Medicine | Admitting: Emergency Medicine

## 2014-05-25 ENCOUNTER — Encounter (HOSPITAL_COMMUNITY): Payer: Self-pay | Admitting: Emergency Medicine

## 2014-05-25 ENCOUNTER — Emergency Department (HOSPITAL_COMMUNITY): Payer: Medicare Other

## 2014-05-25 DIAGNOSIS — R142 Eructation: Principal | ICD-10-CM

## 2014-05-25 DIAGNOSIS — E785 Hyperlipidemia, unspecified: Secondary | ICD-10-CM | POA: Insufficient documentation

## 2014-05-25 DIAGNOSIS — R143 Flatulence: Principal | ICD-10-CM

## 2014-05-25 DIAGNOSIS — Z7982 Long term (current) use of aspirin: Secondary | ICD-10-CM | POA: Insufficient documentation

## 2014-05-25 DIAGNOSIS — Z8719 Personal history of other diseases of the digestive system: Secondary | ICD-10-CM | POA: Insufficient documentation

## 2014-05-25 DIAGNOSIS — E119 Type 2 diabetes mellitus without complications: Secondary | ICD-10-CM | POA: Insufficient documentation

## 2014-05-25 DIAGNOSIS — Z87891 Personal history of nicotine dependence: Secondary | ICD-10-CM | POA: Insufficient documentation

## 2014-05-25 DIAGNOSIS — R609 Edema, unspecified: Secondary | ICD-10-CM | POA: Insufficient documentation

## 2014-05-25 DIAGNOSIS — I10 Essential (primary) hypertension: Secondary | ICD-10-CM | POA: Insufficient documentation

## 2014-05-25 DIAGNOSIS — R141 Gas pain: Secondary | ICD-10-CM | POA: Insufficient documentation

## 2014-05-25 DIAGNOSIS — Z8546 Personal history of malignant neoplasm of prostate: Secondary | ICD-10-CM | POA: Insufficient documentation

## 2014-05-25 DIAGNOSIS — Z8614 Personal history of Methicillin resistant Staphylococcus aureus infection: Secondary | ICD-10-CM | POA: Insufficient documentation

## 2014-05-25 DIAGNOSIS — R140 Abdominal distension (gaseous): Secondary | ICD-10-CM

## 2014-05-25 DIAGNOSIS — Z79899 Other long term (current) drug therapy: Secondary | ICD-10-CM | POA: Insufficient documentation

## 2014-05-25 LAB — COMPREHENSIVE METABOLIC PANEL
ALT: 13 U/L (ref 0–53)
AST: 22 U/L (ref 0–37)
Albumin: 3.7 g/dL (ref 3.5–5.2)
Alkaline Phosphatase: 59 U/L (ref 39–117)
BILIRUBIN TOTAL: 0.4 mg/dL (ref 0.3–1.2)
BUN: 14 mg/dL (ref 6–23)
CHLORIDE: 103 meq/L (ref 96–112)
CO2: 31 meq/L (ref 19–32)
CREATININE: 1.17 mg/dL (ref 0.50–1.35)
Calcium: 9.7 mg/dL (ref 8.4–10.5)
GFR, EST AFRICAN AMERICAN: 72 mL/min — AB (ref >90)
GFR, EST NON AFRICAN AMERICAN: 62 mL/min — AB (ref >90)
GLUCOSE: 85 mg/dL (ref 70–99)
Potassium: 5.1 mEq/L (ref 3.7–5.3)
Sodium: 143 mEq/L (ref 137–147)
Total Protein: 7.8 g/dL (ref 6.0–8.3)

## 2014-05-25 LAB — CBC WITH DIFFERENTIAL/PLATELET
BASOS ABS: 0 10*3/uL (ref 0.0–0.1)
Basophils Relative: 0 % (ref 0–1)
EOS PCT: 4 % (ref 0–5)
Eosinophils Absolute: 0.3 10*3/uL (ref 0.0–0.7)
HEMATOCRIT: 45.7 % (ref 39.0–52.0)
HEMOGLOBIN: 14.6 g/dL (ref 13.0–17.0)
LYMPHS ABS: 1 10*3/uL (ref 0.7–4.0)
LYMPHS PCT: 13 % (ref 12–46)
MCH: 30.5 pg (ref 26.0–34.0)
MCHC: 31.9 g/dL (ref 30.0–36.0)
MCV: 95.6 fL (ref 78.0–100.0)
MONO ABS: 0.6 10*3/uL (ref 0.1–1.0)
MONOS PCT: 7 % (ref 3–12)
NEUTROS ABS: 6 10*3/uL (ref 1.7–7.7)
Neutrophils Relative %: 76 % (ref 43–77)
Platelets: 155 10*3/uL (ref 150–400)
RBC: 4.78 MIL/uL (ref 4.22–5.81)
RDW: 13.2 % (ref 11.5–15.5)
WBC: 8 10*3/uL (ref 4.0–10.5)

## 2014-05-25 LAB — PRO B NATRIURETIC PEPTIDE: Pro B Natriuretic peptide (BNP): 110.1 pg/mL (ref 0–125)

## 2014-05-25 NOTE — ED Notes (Signed)
Pt to radiology.

## 2014-05-25 NOTE — ED Notes (Signed)
Pt presents with c/o abdominal pain. Pt says that he has felt the pain for the past couple of days. Pt denies any n/v/d this week but says that he did have diarrhea all last week. 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 Pt says he feels like there is fluid built up in his stomach.

## 2014-05-25 NOTE — Discharge Instructions (Signed)
Bloating Bloating is the feeling of fullness in your belly. You may feel as though your pants are too tight. Often the cause of bloating is overeating, retaining fluids, or having gas in your bowel. It is also caused by swallowing air and eating foods that cause gas. Irritable bowel syndrome is one of the most common causes of bloating. Constipation is also a common cause. Sometimes more serious problems can cause bloating. SYMPTOMS  Usually there is a feeling of fullness, as though your abdomen is bulged out. There may be mild discomfort.  DIAGNOSIS  Usually no particular testing is necessary for most bloating. If the condition persists and seems to become worse, your caregiver may do additional testing.  TREATMENT   There is no direct treatment for bloating.  Do not put gas into the bowel. Avoid chewing gum and sucking on candy. These tend to make you swallow air. Swallowing air can also be a nervous habit. Try to avoid this.  Avoiding high residue diets will help. Eat foods with soluble fibers (examples include root vegetables, apples, or barley) and substitute dairy products with soy and rice products. This helps irritable bowel syndrome.  If constipation is the cause, then a high residue diet with more fiber will help.  Avoid carbonated beverages.  Over-the-counter preparations are available that help reduce gas. Your pharmacist can help you with this. SEEK MEDICAL CARE IF:   Bloating continues and seems to be getting worse.  You notice a weight gain.  You have a weight loss but the bloating is getting worse.  You have changes in your bowel habits or develop nausea or vomiting. SEEK IMMEDIATE MEDICAL CARE IF:   You develop shortness of breath or swelling in your legs.  You have an increase in abdominal pain or develop chest pain. Document Released: 10/06/2006 Document Revised: 02/28/2012 Document Reviewed: 11/24/2007 [facility] Patient Information 2014 [place]  Edema Edema is an abnormal build-up of fluids in tissues. Because this is partly dependent on gravity (water flows to the lowest place), it is more common in the legs and thighs (lower extremities). It is also common in the looser tissues, like around the eyes. Painless swelling of the feet and ankles is common and increases as a person ages. It may affect both legs and may include the calves or even thighs. When squeezed, the fluid may move out of the affected area and may leave a dent for a few moments. CAUSES   Prolonged standing or sitting in one place for extended periods of time. Movement helps pump tissue fluid into the veins, and absence of movement prevents this, resulting in edema.  Varicose veins. The valves in the veins do not work as well as they should. This causes fluid to leak into the tissues.  Fluid and salt overload.  Injury, burn, or surgery to the leg, ankle, or foot, may damage veins and allow fluid to leak out.  Sunburn damages vessels. Leaky vessels allow fluid to go out into the sunburned tissues.  Allergies (from insect bites or stings, medications or chemicals) cause swelling by allowing vessels to become leaky.  Protein in the blood helps keep fluid in your vessels. Low protein, as in malnutrition, allows fluid to leak out.  Hormonal changes, including pregnancy and menstruation, cause fluid retention. This fluid may leak out of vessels and cause edema.  Medications that cause fluid retention. Examples are sex hormones, blood pressure medications, steroid treatment, or anti-depressants.  Some illnesses cause edema, especially heart failure,  kidney disease, or liver disease.  Surgery that cuts veins or lymph nodes, such as surgery done for the heart or for breast cancer, may result in edema. DIAGNOSIS  Your caregiver is usually easily able to determine what is causing your swelling (edema) by simply asking what is wrong (getting a history) and examining  you (doing a physical). Sometimes x-rays, EKG (electrocardiogram or heart tracing), and blood work may be done to evaluate for underlying medical illness. TREATMENT  General treatment includes:  Leg elevation (or elevation of the affected body part).  Restriction of fluid intake.  Prevention of fluid overload.  Compression of the affected body part. Compression with elastic bandages or support stockings squeezes the tissues, preventing fluid from entering and forcing it back into the blood vessels.  Diuretics (also called water pills or fluid pills) pull fluid out of your body in the form of increased urination. These are effective in reducing the swelling, but can have side effects and must be used only under your caregiver's supervision. Diuretics are appropriate only for some types of edema. The specific treatment can be directed at any underlying causes discovered. Heart, liver, or kidney disease should be treated appropriately. HOME CARE INSTRUCTIONS   Elevate the legs (or affected body part) above the level of the heart, while lying down.  Avoid sitting or standing still for prolonged periods of time.  Avoid putting anything directly under the knees when lying down, and do not wear constricting clothing or garters on the upper legs.  Exercising the legs causes the fluid to work back into the veins and lymphatic channels. This may help the swelling go down.  The pressure applied by elastic bandages or support stockings can help reduce ankle swelling.  A low-salt diet may help reduce fluid retention and decrease the ankle swelling.  Take any medications exactly as prescribed. SEEK MEDICAL CARE IF:  Your edema is not responding to recommended treatments. SEEK IMMEDIATE MEDICAL CARE IF:   You develop shortness of breath or chest pain.  You cannot breathe when you lay down; or if, while lying down, you have to get up and go to the window to get your breath.  You are having  increasing swelling without relief from treatment.  You develop a fever over 102 F (38.9 C).  You develop pain or redness in the areas that are swollen.  Tell your caregiver right away if you have gained 03 lb/1.4 kg in 1 day or 05 lb/2.3 kg in a week. MAKE SURE YOU:   Understand these instructions.  Will watch your condition.  Will get help right away if you are not doing well or get worse. Document Released: 12/06/2005 Document Revised: 06/06/2012 Document Reviewed: 07/24/2008 [facility] Patient Information 2014 [place], [place].

## 2014-05-25 NOTE — ED Notes (Signed)
Initial Contact - pt to RM11 with family, changed to hospital gown.  Pt reports "fullness" in abdomen xfew days. Pt denies pain, denies n/v/d/c.  Pt with colostomy, stoma pink/moist with yellow soft stool present.  Pt denies changes/complaints with colostomy.  Pt denies fevers/chills, CP/SOB, reports eating/drinking well and otherwise feeling at baseline.  Skin PWD.  Abd s/nt/distended.  A+OX4.  Speaking full/clear sentences.  NAD.

## 2014-05-25 NOTE — ED Provider Notes (Signed)
CSN: [id number]     Arrival date & time 05/25/14  1659 History   First MD Initiated Contact with Patient 05/25/14 1800     Chief Complaint  Patient presents with  . Abdominal Pain     (Consider location/radiation/quality/duration/timing/severity/associated sxs/prior Treatment) HPI Comments: 68 yo male with hx of colectomy greater than 10 years ago presenting with abdominal distension.  Gradual onset.  Present for several weeks.  Mild to moderate in severity. Associated with lower extremity swelling. Not associated with fevers, chest pain, shortness of breath, abdominal pain, or nausea/vomiting.     Past Medical History  Diagnosis Date  . Alcohol abuse, in remission 2004  . Diabetes mellitus     Type II  . HTN (hypertension)   . Hyperlipidemia   . History of prostate cancer   . Vitamin D deficiency   . Tobacco abuse   . History of colostomy 2004  . History of MRSA infection 2009  . Intestinal ischemia 2004   Past Surgical History  Procedure Laterality Date  . 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 Colostomy     No family history on file. History  Substance Use Topics  . Smoking status: Former Smoker    Types: Cigarettes    Quit date: 11/14/2010  . Smokeless tobacco: Never Used  . Alcohol Use: No    Review of Systems  Constitutional: Negative for fever.  Respiratory: Negative for cough and shortness of breath.   Cardiovascular: Negative for chest pain.  Gastrointestinal: Positive for diarrhea (last week). Negative for nausea, vomiting and abdominal pain.  All other systems reviewed and are negative.     Allergies  Review of patient's allergies indicates no known allergies.  Home Medications   Prior to Admission medications   Medication Sig Start Date End Date Taking? Authorizing Provider  amLODipine (NORVASC) 5 MG tablet Take 5 mg by mouth daily.     Yes Historical Provider, MD  aspirin 81 MG tablet Take 81 mg by mouth daily.     Yes Historical  Provider, MD  benazepril (LOTENSIN) 20 MG tablet Take 20 mg by mouth daily.   Yes Historical Provider, MD  carvedilol (COREG) 25 MG tablet Take 25 mg by mouth 2 (two) times daily with a meal.     Yes Historical Provider, MD  cholecalciferol (VITAMIN D) 1000 UNITS tablet Take 1,000 Units by mouth daily.     Yes Historical Provider, MD  doxazosin (CARDURA) 2 MG tablet Take 2 mg by mouth daily.   Yes Historical Provider, MD  metFORMIN (GLUCOPHAGE) 500 MG tablet Take 500 mg by mouth 2 (two) times daily with a meal.     Yes Historical Provider, MD  pravastatin (PRAVACHOL) 20 MG tablet Take 20 mg by mouth daily.   Yes Historical Provider, MD  albuterol (PROVENTIL) (2.5 MG/3ML) 0.083% nebulizer solution Take 3 mLs (2.5 mg total) by nebulization every 6 (six) hours as needed for wheezing. 11/22/11 11/21/12  Ezequiel KayserMark A Perini, MD   BP 155/76  Pulse 93  Temp(Src) 98.2 F (36.8 C) (Oral)  Resp 24  SpO2 95% Physical Exam  Nursing note and vitals reviewed. Constitutional: He is oriented to person, place, and time. He appears well-developed and well-nourished. No distress.  HENT:  Head: Normocephalic and atraumatic.  Mouth/Throat: Oropharynx is clear and moist.  Eyes: Conjunctivae are normal. Pupils are equal, round, and reactive to light. No scleral icterus.  Neck: Neck supple.  Cardiovascular: Normal rate, regular rhythm, normal heart  sounds and intact distal pulses.   No murmur heard. Pulmonary/Chest: Effort normal and breath sounds normal. No stridor. No respiratory distress. He has no wheezes. He has no rales.  Abdominal: Soft. He exhibits no distension. There is no tenderness. There is no rigidity, no rebound and no guarding.  No abdominal tenderness. Not appreciably distended. 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  Musculoskeletal: Normal range of motion. He exhibits edema (trace bilateral lower extremity).  Neurological: He is alert and oriented to  person, place, and time.  Skin: Skin is warm and dry. No rash noted.  Psychiatric: He has a normal mood and affect. His behavior is normal.    ED Course  Procedures (including critical care time) Labs Review Labs Reviewed  COMPREHENSIVE METABOLIC PANEL - Abnormal; Notable for the following:    GFR calc non Af Amer 62 (*)    GFR calc Af Amer 72 (*)    All other components within normal limits  CBC WITH DIFFERENTIAL  PRO B NATRIURETIC PEPTIDE    Imaging Review Dg Abd Acute W/chest  05/25/2014   CLINICAL DATA:  Abdominal pain and distention.  Colostomy  EXAM: ACUTE ABDOMEN SERIES (ABDOMEN 2 VIEW & CHEST 1 VIEW)  COMPARISON:  Chest radiograph on 11/19/2011  FINDINGS: There is no evidence of dilated bowel loops or free intraperitoneal air. Some scattered air-fluid levels are seen within nondilated small bowel loops. Possible small bowel wall thickening also noted. No radiopaque calculi identified.  Low lung volumes again demonstrated with mild bibasilar scarring which is not significantly change. No evidence of acute infiltrate or pleural effusion. Mild cardiomegaly remains stable.  IMPRESSION: Nonspecific, nonobstructive bowel gas pattern.  Stable low lung volumes with mild bibasilar scarring.   Electronically Signed   By: Myles Rosenthal M.D.   On: 05/25/2014 19:30  All radiology studies independently viewed by me.      EKG Interpretation None      MDM   Final diagnoses:  Bloating  Trace edema    Pt presented with chief complaint of abdominal "fluid"  He has had no abdominal pain, no abdominal tenderness, no appreciable distension, fluid waves, or ascites. He remained very well appearing during his ED course.  His labwork and AAS were unremarkable.  He appeared appropriate for continued outpatient management.       Candyce Churn III, MD 05/26/14 [phone]

## 2014-09-24 ENCOUNTER — Emergency Department (HOSPITAL_COMMUNITY): Payer: Medicare Other

## 2014-09-24 ENCOUNTER — Encounter (HOSPITAL_COMMUNITY): Payer: Self-pay | Admitting: Emergency Medicine

## 2014-09-24 ENCOUNTER — Inpatient Hospital Stay (HOSPITAL_COMMUNITY)
Admission: EM | Admit: 2014-09-24 | Discharge: 2014-10-16 | DRG: 208 | Disposition: A | Discharge status: Hospice | Payer: Medicare Other | Attending: Internal Medicine | Admitting: Internal Medicine

## 2014-09-24 DIAGNOSIS — E870 Hyperosmolality and hypernatremia: Secondary | ICD-10-CM | POA: Diagnosis not present

## 2014-09-24 DIAGNOSIS — R0602 Shortness of breath: Secondary | ICD-10-CM | POA: Diagnosis not present

## 2014-09-24 DIAGNOSIS — B9789 Other viral agents as the cause of diseases classified elsewhere: Secondary | ICD-10-CM | POA: Diagnosis present

## 2014-09-24 DIAGNOSIS — Z22322 Carrier or suspected carrier of Methicillin resistant Staphylococcus aureus: Secondary | ICD-10-CM

## 2014-09-24 DIAGNOSIS — T380X5A Adverse effect of glucocorticoids and synthetic analogues, initial encounter: Secondary | ICD-10-CM | POA: Diagnosis not present

## 2014-09-24 DIAGNOSIS — Z515 Encounter for palliative care: Secondary | ICD-10-CM

## 2014-09-24 DIAGNOSIS — N179 Acute kidney failure, unspecified: Secondary | ICD-10-CM

## 2014-09-24 DIAGNOSIS — E872 Acidosis: Secondary | ICD-10-CM

## 2014-09-24 DIAGNOSIS — J189 Pneumonia, unspecified organism: Secondary | ICD-10-CM | POA: Diagnosis not present

## 2014-09-24 DIAGNOSIS — G4733 Obstructive sleep apnea (adult) (pediatric): Secondary | ICD-10-CM

## 2014-09-24 DIAGNOSIS — E8729 Other acidosis: Secondary | ICD-10-CM

## 2014-09-24 DIAGNOSIS — J9600 Acute respiratory failure, unspecified whether with hypoxia or hypercapnia: Secondary | ICD-10-CM

## 2014-09-24 DIAGNOSIS — E785 Hyperlipidemia, unspecified: Secondary | ICD-10-CM | POA: Diagnosis present

## 2014-09-24 DIAGNOSIS — Z66 Do not resuscitate: Secondary | ICD-10-CM | POA: Diagnosis present

## 2014-09-24 DIAGNOSIS — J9811 Atelectasis: Secondary | ICD-10-CM

## 2014-09-24 DIAGNOSIS — L0291 Cutaneous abscess, unspecified: Secondary | ICD-10-CM

## 2014-09-24 DIAGNOSIS — Z8546 Personal history of malignant neoplasm of prostate: Secondary | ICD-10-CM

## 2014-09-24 DIAGNOSIS — I10 Essential (primary) hypertension: Secondary | ICD-10-CM

## 2014-09-24 DIAGNOSIS — J9690 Respiratory failure, unspecified, unspecified whether with hypoxia or hypercapnia: Secondary | ICD-10-CM

## 2014-09-24 DIAGNOSIS — Z933 Colostomy status: Secondary | ICD-10-CM

## 2014-09-24 DIAGNOSIS — J441 Chronic obstructive pulmonary disease with (acute) exacerbation: Secondary | ICD-10-CM

## 2014-09-24 DIAGNOSIS — J438 Other emphysema: Secondary | ICD-10-CM

## 2014-09-24 DIAGNOSIS — E119 Type 2 diabetes mellitus without complications: Secondary | ICD-10-CM

## 2014-09-24 DIAGNOSIS — Z8614 Personal history of Methicillin resistant Staphylococcus aureus infection: Secondary | ICD-10-CM

## 2014-09-24 DIAGNOSIS — B952 Enterococcus as the cause of diseases classified elsewhere: Secondary | ICD-10-CM | POA: Diagnosis present

## 2014-09-24 DIAGNOSIS — J9602 Acute respiratory failure with hypercapnia: Secondary | ICD-10-CM

## 2014-09-24 DIAGNOSIS — J9622 Acute and chronic respiratory failure with hypercapnia: Secondary | ICD-10-CM

## 2014-09-24 DIAGNOSIS — D751 Secondary polycythemia: Secondary | ICD-10-CM

## 2014-09-24 DIAGNOSIS — R0600 Dyspnea, unspecified: Secondary | ICD-10-CM

## 2014-09-24 DIAGNOSIS — R5381 Other malaise: Secondary | ICD-10-CM

## 2014-09-24 DIAGNOSIS — N390 Urinary tract infection, site not specified: Secondary | ICD-10-CM

## 2014-09-24 DIAGNOSIS — G9341 Metabolic encephalopathy: Secondary | ICD-10-CM | POA: Diagnosis present

## 2014-09-24 DIAGNOSIS — J9620 Acute and chronic respiratory failure, unspecified whether with hypoxia or hypercapnia: Secondary | ICD-10-CM

## 2014-09-24 DIAGNOSIS — J449 Chronic obstructive pulmonary disease, unspecified: Secondary | ICD-10-CM

## 2014-09-24 DIAGNOSIS — Z9189 Other specified personal risk factors, not elsewhere classified: Secondary | ICD-10-CM

## 2014-09-24 DIAGNOSIS — D696 Thrombocytopenia, unspecified: Secondary | ICD-10-CM | POA: Diagnosis not present

## 2014-09-24 DIAGNOSIS — J411 Mucopurulent chronic bronchitis: Secondary | ICD-10-CM

## 2014-09-24 DIAGNOSIS — J9621 Acute and chronic respiratory failure with hypoxia: Secondary | ICD-10-CM

## 2014-09-24 DIAGNOSIS — Z79899 Other long term (current) drug therapy: Secondary | ICD-10-CM

## 2014-09-24 DIAGNOSIS — E860 Dehydration: Secondary | ICD-10-CM | POA: Diagnosis present

## 2014-09-24 LAB — COMPREHENSIVE METABOLIC PANEL
ALT: 14 U/L (ref 0–53)
AST: 23 U/L (ref 0–37)
Albumin: 3.8 g/dL (ref 3.5–5.2)
Alkaline Phosphatase: 63 U/L (ref 39–117)
Anion gap: 10 (ref 5–15)
BUN: 23 mg/dL (ref 6–23)
CALCIUM: 9.5 mg/dL (ref 8.4–10.5)
CO2: 30 mEq/L (ref 19–32)
Chloride: 104 mEq/L (ref 96–112)
Creatinine, Ser: 1.31 mg/dL (ref 0.50–1.35)
GFR calc Af Amer: 62 mL/min — ABNORMAL LOW (ref >90)
GFR calc non Af Amer: 54 mL/min — ABNORMAL LOW (ref >90)
Glucose, Bld: 126 mg/dL — ABNORMAL HIGH (ref 70–99)
Potassium: 4.8 mEq/L (ref 3.7–5.3)
SODIUM: 144 meq/L (ref 137–147)
TOTAL PROTEIN: 8.4 g/dL — AB (ref 6.0–8.3)
Total Bilirubin: 0.3 mg/dL (ref 0.3–1.2)

## 2014-09-24 LAB — BLOOD GAS, ARTERIAL
Acid-base deficit: 2.2 mmol/L — ABNORMAL HIGH (ref 0.0–2.0)
Bicarbonate: 30.6 mEq/L — ABNORMAL HIGH (ref 20.0–24.0)
DRAWN BY: 11249
FIO2: 1 %
O2 CONTENT: 15 L/min
O2 SAT: 95.3 %
PCO2 ART: 99.6 mmHg — AB (ref 35.0–45.0)
PH ART: 7.114 — AB (ref 7.350–7.450)
PO2 ART: 99.9 mmHg (ref 80.0–100.0)
Patient temperature: 98.6
TCO2: 29.3 mmol/L (ref 0–100)

## 2014-09-24 LAB — CBC WITH DIFFERENTIAL/PLATELET
BASOS ABS: 0 10*3/uL (ref 0.0–0.1)
Basophils Relative: 0 % (ref 0–1)
EOS ABS: 0.2 10*3/uL (ref 0.0–0.7)
EOS PCT: 2 % (ref 0–5)
HCT: 44.7 % (ref 39.0–52.0)
Hemoglobin: 13.8 g/dL (ref 13.0–17.0)
LYMPHS PCT: 18 % (ref 12–46)
Lymphs Abs: 1.9 10*3/uL (ref 0.7–4.0)
MCH: 30.1 pg (ref 26.0–34.0)
MCHC: 30.9 g/dL (ref 30.0–36.0)
MCV: 97.6 fL (ref 78.0–100.0)
Monocytes Absolute: 0.8 10*3/uL (ref 0.1–1.0)
Monocytes Relative: 8 % (ref 3–12)
NEUTROS PCT: 72 % (ref 43–77)
Neutro Abs: 7.2 10*3/uL (ref 1.7–7.7)
PLATELETS: 166 10*3/uL (ref 150–400)
RBC: 4.58 MIL/uL (ref 4.22–5.81)
RDW: 13.2 % (ref 11.5–15.5)
WBC: 10.1 10*3/uL (ref 4.0–10.5)

## 2014-09-24 LAB — I-STAT CG4 LACTIC ACID, ED: LACTIC ACID, VENOUS: 0.41 mmol/L — AB (ref 0.5–2.2)

## 2014-09-24 LAB — TROPONIN I: Troponin I: <0.3 ng/mL (ref <0.30)

## 2014-09-24 LAB — PRO B NATRIURETIC PEPTIDE: PRO B NATRI PEPTIDE: 115.2 pg/mL (ref 0–125)

## 2014-09-24 MED ORDER — ALBUTEROL (5 MG/ML) CONTINUOUS INHALATION SOLN
10.0000 mg/h | INHALATION_SOLUTION | Freq: Once | RESPIRATORY_TRACT | Status: DC
  Filled 2014-09-24: qty 20

## 2014-09-24 MED ORDER — DEXTROSE 5 % IV SOLN
500.0000 mg | Freq: Once | INTRAVENOUS | Status: AC
  Administered 2014-09-25: 500 mg via INTRAVENOUS
  Filled 2014-09-24: qty 500

## 2014-09-24 MED ORDER — SUCCINYLCHOLINE CHLORIDE 20 MG/ML IJ SOLN
130.0000 mg | Freq: Once | INTRAMUSCULAR | Status: AC
  Administered 2014-09-25: 130 mg via INTRAVENOUS
  Filled 2014-09-24: qty 1

## 2014-09-24 MED ORDER — PROPOFOL 10 MG/ML IV EMUL
5.0000 ug/kg/min | Freq: Once | INTRAVENOUS | Status: AC
  Administered 2014-09-25: 5 ug/kg/min via INTRAVENOUS
  Filled 2014-09-24: qty 100

## 2014-09-24 MED ORDER — IPRATROPIUM-ALBUTEROL 0.5-2.5 (3) MG/3ML IN SOLN
3.0000 mL | Freq: Once | RESPIRATORY_TRACT | Status: AC
  Administered 2014-09-24: 3 mL via RESPIRATORY_TRACT
  Filled 2014-09-24: qty 3

## 2014-09-24 MED ORDER — ETOMIDATE 2 MG/ML IV SOLN
30.0000 mg | Freq: Once | INTRAVENOUS | Status: AC
  Administered 2014-09-25: 30 mg via INTRAVENOUS
  Filled 2014-09-24: qty 20

## 2014-09-24 MED ORDER — SUCCINYLCHOLINE CHLORIDE 20 MG/ML IJ SOLN: 100.0000 mg | Freq: Once | INTRAMUSCULAR | Status: DC

## 2014-09-24 MED ORDER — DEXTROSE 5 % IV SOLN
2.0000 g | Freq: Once | INTRAVENOUS | Status: AC
  Administered 2014-09-24: 2 g via INTRAVENOUS
  Filled 2014-09-24: qty 2

## 2014-09-24 NOTE — ED Notes (Signed)
This tech called respiratory, respiratory is on the way.   EDP Criss AlvineGoldston is currently at the bedside with RN, Jari Favrescar

## 2014-09-24 NOTE — ED Provider Notes (Signed)
CSN: [id number]     Arrival date & time 09/24/14  2058 History   First MD Initiated Contact with Patient 09/24/14 2156     No chief complaint on file.    (Consider location/radiation/quality/duration/timing/severity/associated sxs/prior Treatment) HPI 68 year old male presents with cough, shortness of breath, and dizziness. History taken from family as the patient is altered. Family endorses that he's been having a cough and he seems to be stumbling on his feet recently. He has not had any known fevers. No complaints of chest pain or headache. The patient has been more slow to respond and seems like he is "out of it". The patient has had lung disease before and takes albuterol. When COPD as mentioned the family is not sure if he has it or not. He has had some leg swelling bilaterally.  Past Medical History  Diagnosis Date  . Alcohol abuse, in remission 2004  . Diabetes mellitus     Type II  . HTN (hypertension)   . Hyperlipidemia   . History of prostate cancer   . Vitamin D deficiency   . Tobacco abuse   . History of colostomy 2004  . History of MRSA infection 2009  . Intestinal ischemia 2004   Past Surgical History  Procedure Laterality Date  . 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 Colostomy     No family history on file. History  Substance Use Topics  . Smoking status: Former Smoker    Types: Cigarettes    Quit date: 11/14/2010  . Smokeless tobacco: Never Used  . Alcohol Use: No    Review of Systems  Unable to perform ROS: Mental status change      Allergies  Review of patient's allergies indicates no known allergies.  Home Medications   Prior to Admission medications   Medication Sig Start Date End Date Taking? Authorizing Provider  albuterol (PROVENTIL) (2.5 MG/3ML) 0.083% nebulizer solution Take 3 mLs (2.5 mg total) by nebulization every 6 (six) hours as needed for wheezing. 11/22/11 11/21/12  Ezequiel KayserMark A Perini, MD  amLODipine (NORVASC) 5 MG tablet Take 5  mg by mouth daily.      Historical Provider, MD  aspirin 81 MG tablet Take 81 mg by mouth daily.      Historical Provider, MD  benazepril (LOTENSIN) 20 MG tablet Take 20 mg by mouth daily.    Historical Provider, MD  carvedilol (COREG) 25 MG tablet Take 25 mg by mouth 2 (two) times daily with a meal.      Historical Provider, MD  cholecalciferol (VITAMIN D) 1000 UNITS tablet Take 1,000 Units by mouth daily.      Historical Provider, MD  doxazosin (CARDURA) 2 MG tablet Take 2 mg by mouth daily.    Historical Provider, MD  metFORMIN (GLUCOPHAGE) 500 MG tablet Take 500 mg by mouth 2 (two) times daily with a meal.      Historical Provider, MD  pravastatin (PRAVACHOL) 20 MG tablet Take 20 mg by mouth daily.    Historical Provider, MD   BP 177/74  Pulse 92  Temp(Src) 98.6 F (37 C) (Oral)  Resp 18  Ht 5\' 10"  (1.778 m)  Wt 205 lb (92.987 kg)  BMI 29.41 kg/m2  SpO2 91% Physical Exam  Nursing note and vitals reviewed. Constitutional: He is oriented to person, place, and time. He appears well-developed and well-nourished. He appears lethargic.  HENT:  Head: Normocephalic and atraumatic.  Right Ear: External ear normal.  Left Ear: External ear  normal.  Nose: Nose normal.  Eyes: Right eye exhibits no discharge. Left eye exhibits no discharge.  Neck: Neck supple.  Cardiovascular: Normal rate, regular rhythm, normal heart sounds and intact distal pulses.   Pulmonary/Chest: Tachypnea noted. He has wheezes. He has rhonchi.  Abdominal: Soft. There is no tenderness.  Musculoskeletal: He exhibits edema (trace bilateral lower extremity edema).  Neurological: He is oriented to person, place, and time. He appears lethargic.  Lethargic but responds to most questions appropriately. Knows location, self and time. Normal strength in all 4 extremities. Normal Cranial Nerve testing.  Skin: Skin is warm and dry.    ED Course  INTUBATION Date/Time: 09/25/2014 12:17 AM Performed by: Audree Camel Authorized by: Pricilla Loveless T Consent: Verbal consent obtained. Risks and benefits: risks, benefits and alternatives were discussed Consent given by: brother and sister. Required items: required blood products, implants, devices, and special equipment available Patient identity confirmed: hospital-assigned identification number Time out: Immediately prior to procedure a "time out" was called to verify the correct patient, procedure, equipment, support staff and site/side marked as required. Indications: respiratory failure,  airway protection and  hypercapnia Intubation method: video-assisted Patient status: paralyzed (RSI) Preoxygenation: bipap. Sedatives: etomidate Paralytic: succinylcholine Tube size: 7.5 mm Tube type: cuffed Number of attempts: 1 Cricoid pressure: no Cords visualized: yes Post-procedure assessment: chest rise and CO2 detector Breath sounds: equal Cuff inflated: yes ETT to teeth: 25 cm Tube secured with: ETT holder Chest x-ray interpreted by me. Chest x-ray findings: endotracheal tube too low Tube repositioned: tube repositioned successfully Patient tolerance: Patient tolerated the procedure well with no immediate complications. Comments: Patient's tube right near carina, will pull back 2 cm   (including critical care time) Labs Review Labs Reviewed  COMPREHENSIVE METABOLIC PANEL - Abnormal; Notable for the following:    Glucose, Bld 126 (*)    Total Protein 8.4 (*)    GFR calc non Af Amer 54 (*)    GFR calc Af Amer 62 (*)    All other components within normal limits  BLOOD GAS, ARTERIAL - Abnormal; Notable for the following:    pH, Arterial 7.114 (*)    pCO2 arterial 99.6 (*)    Bicarbonate 30.6 (*)    Acid-base deficit 2.2 (*)    All other components within normal limits  I-STAT CG4 LACTIC ACID, ED - Abnormal; Notable for the following:    Lactic Acid, Venous 0.41 (*)    All other components within normal limits  CULTURE, BLOOD (ROUTINE X 2)   CULTURE, BLOOD (ROUTINE X 2)  CBC WITH DIFFERENTIAL  TROPONIN I  PRO B NATRIURETIC PEPTIDE    Imaging Review Dg Chest Portable 1 View  09/24/2014   CLINICAL DATA:  Initial encounter for cough and hypoxia. Dizziness in not feeling well for 1 week.  EXAM: PORTABLE CHEST - 1 VIEW  COMPARISON:  One-view chest 05/25/2014  FINDINGS: The heart is enlarged. The lung volumes are low. New bilateral effusions are present. Asymmetric right basilar airspace disease is present. Mild pulmonary vascular congestion is noted.  IMPRESSION: 1. Cardiomegaly and mild pulmonary vascular congestion. 2. New bilateral pleural effusion. 3. Right greater left basilar airspace disease. While this may represent atelectasis, there is concern for pneumonia, particularly at the right base.   Electronically Signed   By: Gennette Pac M.D.   On: 09/24/2014 23:14     EKG Interpretation   Date/Time:  Tuesday September 24 2014 21:43:17 EDT Ventricular Rate:  96 PR Interval:  170  QRS Duration: 82 QT Interval:  361 QTC Calculation: 456 R Axis:   39 Text Interpretation:  Sinus rhythm Probable left atrial enlargement RSR'  in V1 or V2, right VCD or RVH Baseline wander in lead(s) V6 No significant  change since last tracing Confirmed by Alic Hilburn  MD, Sergio Zawislak ([id number]) on  09/24/2014 10:21:44 PM      CRITICAL CARE Performed by: Pricilla Loveless T   Total critical care time: 60 minutes  Critical care time was exclusive of separately billable procedures and treating other patients.  Critical care was necessary to treat or prevent imminent or life-threatening deterioration.  Critical care was time spent personally by me on the following activities: development of treatment plan with patient and/or surrogate as well as nursing, discussions with consultants, evaluation of patient's response to treatment, examination of patient, obtaining history from patient or surrogate, ordering and performing treatments and interventions,  ordering and review of laboratory studies, ordering and review of radiographic studies, pulse oximetry and re-evaluation of patient's condition.   MDM   Final diagnoses:  Acute respiratory failure with hypercapnia  Respiratory acidosis  Community acquired pneumonia    Patient is hypoxic but does not appear acute distress. He is lethargic but is maintaining his airway. Workup shows respiratory acidosis with pneumonia. He's not been admitted and over 3 months. Due to this he was given community-acquired antibiotics after blood cultures. Given his respiratory acidosis he was given a trial of BiPAP. He proceeded to become more lethargic. He was unable to consent for intubation. His brother and sister at the bedside agreed that he needed intubation. I discussed the case with Dr. Craige Cotta and critical care will admit for vent management and pneumonia care. Patient admitted to ICU in serious condition.    Audree Camel, MD 09/25/14 [phone]

## 2014-09-24 NOTE — ED Notes (Signed)
Pt presents short of breath, anxious and unable to clearly communicate, family report he was dizziness and "out of it" for about a week now. Pt O2 sats 67% on room air. Pt placed on a nonrebreather, EDP at bedside at this time

## 2014-09-25 ENCOUNTER — Emergency Department (HOSPITAL_COMMUNITY): Payer: Medicare Other

## 2014-09-25 ENCOUNTER — Encounter (HOSPITAL_COMMUNITY): Payer: Self-pay

## 2014-09-25 ENCOUNTER — Inpatient Hospital Stay (HOSPITAL_COMMUNITY): Payer: Medicare Other

## 2014-09-25 DIAGNOSIS — E785 Hyperlipidemia, unspecified: Secondary | ICD-10-CM | POA: Diagnosis present

## 2014-09-25 DIAGNOSIS — J9621 Acute and chronic respiratory failure with hypoxia: Secondary | ICD-10-CM

## 2014-09-25 DIAGNOSIS — B9789 Other viral agents as the cause of diseases classified elsewhere: Secondary | ICD-10-CM | POA: Diagnosis present

## 2014-09-25 DIAGNOSIS — E860 Dehydration: Secondary | ICD-10-CM | POA: Diagnosis present

## 2014-09-25 DIAGNOSIS — J9602 Acute respiratory failure with hypercapnia: Secondary | ICD-10-CM | POA: Diagnosis not present

## 2014-09-25 DIAGNOSIS — R0602 Shortness of breath: Secondary | ICD-10-CM | POA: Diagnosis present

## 2014-09-25 DIAGNOSIS — G9341 Metabolic encephalopathy: Secondary | ICD-10-CM | POA: Diagnosis present

## 2014-09-25 DIAGNOSIS — Z933 Colostomy status: Secondary | ICD-10-CM | POA: Diagnosis not present

## 2014-09-25 DIAGNOSIS — J411 Mucopurulent chronic bronchitis: Secondary | ICD-10-CM

## 2014-09-25 DIAGNOSIS — Z79899 Other long term (current) drug therapy: Secondary | ICD-10-CM | POA: Diagnosis not present

## 2014-09-25 DIAGNOSIS — D696 Thrombocytopenia, unspecified: Secondary | ICD-10-CM | POA: Diagnosis not present

## 2014-09-25 DIAGNOSIS — J9811 Atelectasis: Secondary | ICD-10-CM | POA: Diagnosis present

## 2014-09-25 DIAGNOSIS — Z8546 Personal history of malignant neoplasm of prostate: Secondary | ICD-10-CM | POA: Diagnosis not present

## 2014-09-25 DIAGNOSIS — I10 Essential (primary) hypertension: Secondary | ICD-10-CM

## 2014-09-25 DIAGNOSIS — J9620 Acute and chronic respiratory failure, unspecified whether with hypoxia or hypercapnia: Secondary | ICD-10-CM | POA: Diagnosis present

## 2014-09-25 DIAGNOSIS — J441 Chronic obstructive pulmonary disease with (acute) exacerbation: Secondary | ICD-10-CM | POA: Diagnosis present

## 2014-09-25 DIAGNOSIS — J9622 Acute and chronic respiratory failure with hypercapnia: Secondary | ICD-10-CM | POA: Diagnosis present

## 2014-09-25 DIAGNOSIS — T380X5A Adverse effect of glucocorticoids and synthetic analogues, initial encounter: Secondary | ICD-10-CM | POA: Diagnosis not present

## 2014-09-25 DIAGNOSIS — N390 Urinary tract infection, site not specified: Secondary | ICD-10-CM | POA: Diagnosis present

## 2014-09-25 DIAGNOSIS — B952 Enterococcus as the cause of diseases classified elsewhere: Secondary | ICD-10-CM | POA: Diagnosis present

## 2014-09-25 DIAGNOSIS — N179 Acute kidney failure, unspecified: Secondary | ICD-10-CM | POA: Diagnosis not present

## 2014-09-25 DIAGNOSIS — E872 Acidosis: Secondary | ICD-10-CM | POA: Diagnosis present

## 2014-09-25 DIAGNOSIS — Z8614 Personal history of Methicillin resistant Staphylococcus aureus infection: Secondary | ICD-10-CM | POA: Diagnosis not present

## 2014-09-25 DIAGNOSIS — G4733 Obstructive sleep apnea (adult) (pediatric): Secondary | ICD-10-CM | POA: Diagnosis present

## 2014-09-25 DIAGNOSIS — I369 Nonrheumatic tricuspid valve disorder, unspecified: Secondary | ICD-10-CM

## 2014-09-25 DIAGNOSIS — J189 Pneumonia, unspecified organism: Secondary | ICD-10-CM | POA: Diagnosis present

## 2014-09-25 DIAGNOSIS — E870 Hyperosmolality and hypernatremia: Secondary | ICD-10-CM | POA: Diagnosis not present

## 2014-09-25 DIAGNOSIS — E119 Type 2 diabetes mellitus without complications: Secondary | ICD-10-CM | POA: Diagnosis present

## 2014-09-25 DIAGNOSIS — J440 Chronic obstructive pulmonary disease with acute lower respiratory infection: Secondary | ICD-10-CM | POA: Diagnosis present

## 2014-09-25 DIAGNOSIS — Z6829 Body mass index (BMI) 29.0-29.9, adult: Secondary | ICD-10-CM | POA: Diagnosis not present

## 2014-09-25 DIAGNOSIS — Z66 Do not resuscitate: Secondary | ICD-10-CM | POA: Diagnosis present

## 2014-09-25 DIAGNOSIS — D751 Secondary polycythemia: Secondary | ICD-10-CM | POA: Diagnosis present

## 2014-09-25 DIAGNOSIS — Z515 Encounter for palliative care: Secondary | ICD-10-CM | POA: Diagnosis not present

## 2014-09-25 DIAGNOSIS — J449 Chronic obstructive pulmonary disease, unspecified: Secondary | ICD-10-CM | POA: Diagnosis not present

## 2014-09-25 LAB — COMPREHENSIVE METABOLIC PANEL
ALT: 12 U/L (ref 0–53)
AST: 20 U/L (ref 0–37)
Albumin: 3.3 g/dL — ABNORMAL LOW (ref 3.5–5.2)
Alkaline Phosphatase: 60 U/L (ref 39–117)
Anion gap: 8 (ref 5–15)
BUN: 22 mg/dL (ref 6–23)
CO2: 28 meq/L (ref 19–32)
Calcium: 9 mg/dL (ref 8.4–10.5)
Chloride: 103 mEq/L (ref 96–112)
Creatinine, Ser: 1.37 mg/dL — ABNORMAL HIGH (ref 0.50–1.35)
GFR calc Af Amer: 59 mL/min — ABNORMAL LOW (ref >90)
GFR, EST NON AFRICAN AMERICAN: 51 mL/min — AB (ref >90)
Glucose, Bld: 162 mg/dL — ABNORMAL HIGH (ref 70–99)
POTASSIUM: 4.9 meq/L (ref 3.7–5.3)
SODIUM: 139 meq/L (ref 137–147)
Total Bilirubin: 0.3 mg/dL (ref 0.3–1.2)
Total Protein: 7.4 g/dL (ref 6.0–8.3)

## 2014-09-25 LAB — CBC WITH DIFFERENTIAL/PLATELET
Basophils Absolute: 0 10*3/uL (ref 0.0–0.1)
Basophils Relative: 0 % (ref 0–1)
EOS PCT: 1 % (ref 0–5)
Eosinophils Absolute: 0.1 10*3/uL (ref 0.0–0.7)
HCT: 42 % (ref 39.0–52.0)
Hemoglobin: 12.6 g/dL — ABNORMAL LOW (ref 13.0–17.0)
LYMPHS ABS: 1.2 10*3/uL (ref 0.7–4.0)
Lymphocytes Relative: 14 % (ref 12–46)
MCH: 29.1 pg (ref 26.0–34.0)
MCHC: 30 g/dL (ref 30.0–36.0)
MCV: 97 fL (ref 78.0–100.0)
MONO ABS: 0.6 10*3/uL (ref 0.1–1.0)
Monocytes Relative: 7 % (ref 3–12)
Neutro Abs: 6.7 10*3/uL (ref 1.7–7.7)
Neutrophils Relative %: 78 % — ABNORMAL HIGH (ref 43–77)
Platelets: 162 10*3/uL (ref 150–400)
RBC: 4.33 MIL/uL (ref 4.22–5.81)
RDW: 13.1 % (ref 11.5–15.5)
WBC: 8.5 10*3/uL (ref 4.0–10.5)

## 2014-09-25 LAB — BLOOD GAS, ARTERIAL
ACID-BASE DEFICIT: 3.9 mmol/L — AB (ref 0.0–2.0)
Acid-Base Excess: 3 mmol/L — ABNORMAL HIGH (ref 0.0–2.0)
Acid-Base Excess: 3.6 mmol/L — ABNORMAL HIGH (ref 0.0–2.0)
Allens test (pass/fail): POSITIVE — AB
BICARBONATE: 24.5 meq/L — AB (ref 20.0–24.0)
Bicarbonate: 26.2 mEq/L — ABNORMAL HIGH (ref 20.0–24.0)
Bicarbonate: 24.9 mEq/L — ABNORMAL HIGH (ref 20.0–24.0)
DRAWN BY: 11249
DRAWN BY: 422461
Drawn by: 295031
FIO2: 1 %
FIO2: 1 %
FIO2: 1 %
LHR: 20 {breaths}/min
MECHVT: 540 mL
MECHVT: 540 mL
MECHVT: 540 mL
O2 SAT: 98.6 %
O2 SAT: 98.7 %
O2 Saturation: 84.7 %
PATIENT TEMPERATURE: 98.8
PCO2 ART: 75.3 mmHg — AB (ref 35.0–45.0)
PEEP/CPAP: 5 cmH2O
PEEP/CPAP: 5 cmH2O
PEEP/CPAP: 8 cmH2O
PH ART: 7.168 — AB (ref 7.350–7.450)
PO2 ART: 58.6 mmHg — AB (ref 80.0–100.0)
Patient temperature: 98.6
Patient temperature: 98.6
RATE: 24 resp/min
RATE: 24 resp/min
TCO2: 24.9 mmol/L (ref 0–100)
TCO2: 21.5 mmol/L (ref 0–100)
TCO2: 20.7 mmol/L (ref 0–100)
pCO2 arterial: 30.9 mmHg — ABNORMAL LOW (ref 35.0–45.0)
pCO2 arterial: 27 mmHg — ABNORMAL LOW (ref 35.0–45.0)
pH, Arterial: 7.519 — ABNORMAL HIGH (ref 7.350–7.450)
pH, Arterial: 7.566 — ABNORMAL HIGH (ref 7.350–7.450)
pO2, Arterial: 100 mmHg (ref 80.0–100.0)
pO2, Arterial: 102 mmHg — ABNORMAL HIGH (ref 80.0–100.0)

## 2014-09-25 LAB — URINALYSIS, ROUTINE W REFLEX MICROSCOPIC
Bilirubin Urine: NEGATIVE
Glucose, UA: NEGATIVE mg/dL
KETONES UR: NEGATIVE mg/dL
LEUKOCYTES UA: NEGATIVE
NITRITE: NEGATIVE
Protein, ur: 30 mg/dL — AB
Specific Gravity, Urine: 1.023 (ref 1.005–1.030)
UROBILINOGEN UA: 0.2 mg/dL (ref 0.0–1.0)
pH: 5 (ref 5.0–8.0)

## 2014-09-25 LAB — TROPONIN I: Troponin I: <0.3 ng/mL (ref <0.30)

## 2014-09-25 LAB — MRSA PCR SCREENING: MRSA by PCR: NEGATIVE

## 2014-09-25 LAB — PROTIME-INR
INR: 1.23 (ref 0.00–1.49)
Prothrombin Time: 15.6 seconds — ABNORMAL HIGH (ref 11.6–15.2)

## 2014-09-25 LAB — GLUCOSE, CAPILLARY
GLUCOSE-CAPILLARY: 141 mg/dL — AB (ref 70–99)
Glucose-Capillary: 120 mg/dL — ABNORMAL HIGH (ref 70–99)
Glucose-Capillary: 153 mg/dL — ABNORMAL HIGH (ref 70–99)
Glucose-Capillary: 144 mg/dL — ABNORMAL HIGH (ref 70–99)
Glucose-Capillary: 188 mg/dL — ABNORMAL HIGH (ref 70–99)

## 2014-09-25 LAB — URINE MICROSCOPIC-ADD ON

## 2014-09-25 LAB — TRIGLYCERIDES: TRIGLYCERIDES: 89 mg/dL (ref <150)

## 2014-09-25 LAB — ABO/RH: ABO/RH(D): A POS

## 2014-09-25 LAB — STREP PNEUMONIAE URINARY ANTIGEN: STREP PNEUMO URINARY ANTIGEN: NEGATIVE

## 2014-09-25 LAB — APTT: aPTT: 28 seconds (ref 24–37)

## 2014-09-25 LAB — TYPE AND SCREEN
ABO/RH(D): A POS
ANTIBODY SCREEN: NEGATIVE

## 2014-09-25 LAB — PRO B NATRIURETIC PEPTIDE: Pro B Natriuretic peptide (BNP): 146.1 pg/mL — ABNORMAL HIGH (ref 0–125)

## 2014-09-25 LAB — LIPASE, BLOOD: Lipase: 56 U/L (ref 11–59)

## 2014-09-25 LAB — CORTISOL: Cortisol, Plasma: 50.7 ug/dL

## 2014-09-25 LAB — PHOSPHORUS: Phosphorus: 2.9 mg/dL (ref 2.3–4.6)

## 2014-09-25 LAB — MAGNESIUM: Magnesium: 1.8 mg/dL (ref 1.5–2.5)

## 2014-09-25 LAB — PROCALCITONIN

## 2014-09-25 LAB — LACTIC ACID, PLASMA: LACTIC ACID, VENOUS: 1.4 mmol/L (ref 0.5–2.2)

## 2014-09-25 LAB — AMYLASE: Amylase: 114 U/L — ABNORMAL HIGH (ref 0–105)

## 2014-09-25 MED ORDER — CARVEDILOL 12.5 MG PO TABS
12.5000 mg | ORAL_TABLET | Freq: Two times a day (BID) | ORAL | Status: DC
  Administered 2014-09-25 – 2014-09-27 (×4): 12.5 mg via ORAL
  Filled 2014-09-25 (×4): qty 1

## 2014-09-25 MED ORDER — SODIUM CHLORIDE 0.9 % IV SOLN
0.0000 ug/h | INTRAVENOUS | Status: DC
  Administered 2014-09-25: 150 ug/h via INTRAVENOUS
  Administered 2014-09-25: 250 ug/h via INTRAVENOUS
  Filled 2014-09-25 (×2): qty 50

## 2014-09-25 MED ORDER — MIDAZOLAM HCL 2 MG/2ML IJ SOLN
2.0000 mg | Freq: Once | INTRAMUSCULAR | Status: AC
  Administered 2014-09-25: 2 mg via INTRAVENOUS

## 2014-09-25 MED ORDER — DOCUSATE SODIUM 100 MG PO CAPS
100.0000 mg | ORAL_CAPSULE | Freq: Every day | ORAL | Status: DC
  Filled 2014-09-25: qty 1

## 2014-09-25 MED ORDER — AMLODIPINE BESYLATE 5 MG PO TABS
5.0000 mg | ORAL_TABLET | Freq: Every day | ORAL | Status: DC
  Administered 2014-09-25 – 2014-09-27 (×3): 5 mg via ORAL
  Filled 2014-09-25 (×3): qty 1

## 2014-09-25 MED ORDER — ENOXAPARIN SODIUM 40 MG/0.4ML ~~LOC~~ SOLN
40.0000 mg | SUBCUTANEOUS | Status: DC
  Administered 2014-09-25 – 2014-10-15 (×21): 40 mg via SUBCUTANEOUS
  Filled 2014-09-25 (×22): qty 0.4

## 2014-09-25 MED ORDER — PRO-STAT SUGAR FREE PO LIQD
60.0000 mL | Freq: Two times a day (BID) | ORAL | Status: DC
  Administered 2014-09-25 (×2): 60 mL
  Filled 2014-09-25 (×5): qty 60

## 2014-09-25 MED ORDER — FENTANYL CITRATE 0.05 MG/ML IJ SOLN: 50.0000 ug | Freq: Once | INTRAMUSCULAR | Status: DC

## 2014-09-25 MED ORDER — CETYLPYRIDINIUM CHLORIDE 0.05 % MT LIQD
7.0000 mL | Freq: Four times a day (QID) | OROMUCOSAL | Status: DC
  Administered 2014-09-25 – 2014-09-27 (×6): 7 mL via OROMUCOSAL

## 2014-09-25 MED ORDER — METHYLPREDNISOLONE SODIUM SUCC 125 MG IJ SOLR
125.0000 mg | Freq: Once | INTRAMUSCULAR | Status: AC
  Administered 2014-09-25: 125 mg via INTRAVENOUS
  Filled 2014-09-25: qty 2

## 2014-09-25 MED ORDER — SODIUM CHLORIDE 0.9 % IV SOLN: 250.0000 mL | INTRAVENOUS | Status: DC | PRN

## 2014-09-25 MED ORDER — METHYLPREDNISOLONE SODIUM SUCC 125 MG IJ SOLR
60.0000 mg | Freq: Four times a day (QID) | INTRAMUSCULAR | Status: DC
  Administered 2014-09-25 – 2014-09-26 (×6): 60 mg via INTRAVENOUS
  Filled 2014-09-25 (×6): qty 2

## 2014-09-25 MED ORDER — INSULIN ASPART 100 UNIT/ML ~~LOC~~ SOLN
2.0000 [IU] | SUBCUTANEOUS | Status: DC
  Administered 2014-09-25: 2 [IU] via SUBCUTANEOUS
  Administered 2014-09-25 (×3): 4 [IU] via SUBCUTANEOUS
  Administered 2014-09-26 (×2): 2 [IU] via SUBCUTANEOUS
  Administered 2014-09-26 (×4): 4 [IU] via SUBCUTANEOUS
  Administered 2014-09-27 (×3): 2 [IU] via SUBCUTANEOUS

## 2014-09-25 MED ORDER — MIDAZOLAM HCL 2 MG/2ML IJ SOLN
INTRAMUSCULAR | Status: AC
  Administered 2014-09-25: 2 mg via INTRAVENOUS
  Filled 2014-09-25: qty 2

## 2014-09-25 MED ORDER — CHLORHEXIDINE GLUCONATE 0.12 % MT SOLN
15.0000 mL | Freq: Two times a day (BID) | OROMUCOSAL | Status: DC
  Administered 2014-09-25 – 2014-09-26 (×4): 15 mL via OROMUCOSAL
  Filled 2014-09-25 (×4): qty 15

## 2014-09-25 MED ORDER — FENTANYL CITRATE 0.05 MG/ML IJ SOLN
25.0000 ug | INTRAMUSCULAR | Status: DC | PRN
  Administered 2014-09-25 (×3): 100 ug via INTRAVENOUS
  Filled 2014-09-25 (×3): qty 2

## 2014-09-25 MED ORDER — DEXTROSE 5 % IV SOLN
2.0000 g | INTRAVENOUS | Status: DC
  Administered 2014-09-25 – 2014-09-26 (×2): 2 g via INTRAVENOUS
  Filled 2014-09-25 (×2): qty 2

## 2014-09-25 MED ORDER — INFLUENZA VAC SPLIT QUAD 0.5 ML IM SUSY
0.5000 mL | PREFILLED_SYRINGE | INTRAMUSCULAR | Status: DC
  Filled 2014-09-25 (×7): qty 0.5

## 2014-09-25 MED ORDER — DEXTROSE 5 % IV SOLN
500.0000 mg | INTRAVENOUS | Status: DC
  Administered 2014-09-25: 500 mg via INTRAVENOUS
  Filled 2014-09-25: qty 500

## 2014-09-25 MED ORDER — VITAL HIGH PROTEIN PO LIQD
1000.0000 mL | ORAL | Status: DC
  Administered 2014-09-25: 1000 mL
  Filled 2014-09-25 (×2): qty 1000

## 2014-09-25 MED ORDER — PROPOFOL 10 MG/ML IV EMUL
0.0000 ug/kg/min | INTRAVENOUS | Status: DC
  Administered 2014-09-25: 45 ug/kg/min via INTRAVENOUS
  Administered 2014-09-25 (×2): 50 ug/kg/min via INTRAVENOUS
  Filled 2014-09-25 (×2): qty 100

## 2014-09-25 MED ORDER — IPRATROPIUM-ALBUTEROL 0.5-2.5 (3) MG/3ML IN SOLN
3.0000 mL | RESPIRATORY_TRACT | Status: DC
  Administered 2014-09-25 – 2014-09-27 (×14): 3 mL via RESPIRATORY_TRACT
  Filled 2014-09-25 (×15): qty 3

## 2014-09-25 MED ORDER — FENTANYL BOLUS VIA INFUSION
25.0000 ug | INTRAVENOUS | Status: DC | PRN
  Filled 2014-09-25: qty 50

## 2014-09-25 MED ORDER — DOCUSATE SODIUM 50 MG/5ML PO LIQD
100.0000 mg | Freq: Every day | ORAL | Status: DC
  Administered 2014-09-25: 100 mg via ORAL
  Filled 2014-09-25 (×2): qty 10

## 2014-09-25 MED ORDER — PANTOPRAZOLE SODIUM 40 MG IV SOLR
40.0000 mg | INTRAVENOUS | Status: DC
  Administered 2014-09-25 (×2): 40 mg via INTRAVENOUS
  Filled 2014-09-25 (×2): qty 40

## 2014-09-25 NOTE — Progress Notes (Signed)
INITIAL NUTRITION ASSESSMENT  DOCUMENTATION CODES Per approved criteria  -Not Applicable   INTERVENTION:  TF recommendations: Initiate Vital HP @ 10 ml/hr via OGT and increase by 10 ml every 4 hours to goal rate of 40 ml/hr.   60 ml Prostat BID.    Tube feeding regimen provides 1360 kcal (100% of needs), 144 grams of protein, and 803 ml of H2O.   TF plus Propofol infusion will provide a total of 2030 kcals per day (100% of estimated needs).   NUTRITION DIAGNOSIS: Inadequate oral intake  related to inability to eat as evidenced by NPO status.   Goal: Pt to meet >/= 90% of their estimated nutrition needs   Monitor:  TF regimen & tolerance, respiratory status, weight, labs, I/O's  Reason for Assessment: Consult received to initiate and manage enteral nutrition support.  Admitting Dx: <principal problem not specified>  ASSESSMENT: [name] [facility] with SOB, cough, dizziness. In ED, CXR suggestive of PNA. He failed BiPAP and required intubation.  Per weight history documentation, pt weight has been stable.  Per family, pt has not been eating well x 3 weeks d/t decreased appetite. Family states that pt may only eat a bowl of cereal all day, if eating at all. Cough and symptoms appeared around 2 weeks ago.  Patient is currently intubated on ventilator support d/t hypercarbic respiratory failure in setting of CAP + AECOPD MV: 13.0 L/min Temp (24hrs), Avg:98.3 F (36.8 C), Min:97.9 F (36.6 C), Max:98.8 F (37.1 C)  Propofol: 27.9 ml/hr- providing 670 fat kcal  Labs reviewed: High Creatinine  Glucose 162  Height: Ht Readings from Last 1 Encounters:  09/24/14 5\' 10"  (1.778 m)    Weight: Wt Readings from Last 1 Encounters:  09/25/14 207 lb 7.3 oz (94.1 kg)    Ideal Body Weight: 166 lb  % Ideal Body Weight: 125%  Wt Readings from Last 10 Encounters:  09/25/14 207 lb 7.3 oz (94.1 kg)  01/06/12 209 lb (94.802 kg)  12/03/11 198 lb 6.4 oz (89.994  kg)  11/19/11 200 lb 6.4 oz (90.9 kg)  11/19/11 200 lb 6.4 oz (90.9 kg)  11/15/11 206 lb 3.2 oz (93.532 kg)    Usual Body Weight: 200 lb  % Usual Body Weight: 104%  BMI:  Body mass index is 29.77 kg/(m^2).  Estimated Nutritional Needs: Kcal: 2036 Protein:140-150g  Fluid: 2L/day  Skin: intact  Diet Order: NPO  EDUCATION NEEDS: -No education needs identified at this time   Intake/Output Summary (Last 24 hours) at 09/25/14 0849 Last data filed at 09/25/14 0700  Gross per 24 hour  Intake 142.31 ml  Output    275 ml  Net -132.69 ml    Last BM: 10/7  Labs:   Recent Labs Lab 09/24/14 2214 09/25/14 0226  NA 144 139  K 4.8 4.9  CL 104 103  CO2 30 28  BUN 23 22  CREATININE 1.31 1.37*  CALCIUM 9.5 9.0  MG  --  1.8  PHOS  --  2.9  GLUCOSE 126* 162*    CBG (last 3)   Recent Labs  09/25/14 0400  GLUCAP 120*    Scheduled Meds: . albuterol  10 mg/hr Nebulization Once  . antiseptic oral rinse  7 mL Mouth Rinse QID  . azithromycin  500 mg Intravenous Q24H  . cefTRIAXone (ROCEPHIN)  IV  2 g Intravenous Q24H  . chlorhexidine  15 mL Mouth Rinse BID  . enoxaparin (LOVENOX) injection  40 mg  Subcutaneous Q24H  . insulin aspart  2-6 Units Subcutaneous 6 times per day  . ipratropium-albuterol  3 mL Nebulization Q4H  . methylPREDNISolone (SOLU-MEDROL) injection  60 mg Intravenous Q6H  . pantoprazole (PROTONIX) IV  40 mg Intravenous Q24H    Continuous Infusions: . propofol 50 mcg/kg/min (09/25/14 1610)    Past Medical History  Diagnosis Date  . Alcohol abuse, in remission 2004  . Diabetes mellitus     Type II  . HTN (hypertension)   . Hyperlipidemia   . History of prostate cancer   . Vitamin D deficiency   . Tobacco abuse   . History of colostomy 2004  . History of MRSA infection 2009  . Intestinal ischemia 2004    Past Surgical History  Procedure Laterality Date  . 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 Colostomy      Tilda Franco, MS,  RD, PLDN Provisionally Licensed Dietitian Nutritionist Pager: [phone]

## 2014-09-25 NOTE — Progress Notes (Signed)
Called to assess patient at bedside for poor volume return on vent.  No resistance for volume in but minimal returned.  Glide scope inserted and balloon visualized inflated above the cords.  Balloon deflated and ETT advanced to 23 cm at teeth, visualized ETT move through the cords.  Pt returned back to vent with good volume return.  CXR pending.  Canary BrimBrandi Ollis, NP-C [place] [facility] Pgr: [phone] or [phone]  Heber CarolinaBrent McQuaid, MD [place] [facility] Pager: [phone] Cell: [phone] If no response, call [phone]

## 2014-09-25 NOTE — H&P (Signed)
PULMONARY / CRITICAL CARE MEDICINE   Name: Philip [name] MRN: [id number] DOB: [birth year]  PCP Ezequiel Kayser, MD    ADMISSION DATE:  09/24/2014 CONSULTATION DATE:  09/25/2014  REFERRING MD :  EDP  CHIEF COMPLAINT:  SOB  INITIAL PRESENTATION: [name] brought to [facility] ED on 10/6 with SOB, cough, dizziness.  In ED, CXR suggestive of PNA.  He failed BiPAP and required intubation, PCCM consulted for admission.  STUDIES:  CXR 10/6 >> vascular congestion, b/l pleural effusions, RLL atx vs infiltrate. TTE 10/7 >>   SIGNIFICANT EVENTS: 10/6  presented to ED, started on BiPAP but failed, required intubation. 10/7  ETT repositioned with poor volume return on vent, to 25 cm at teeth  SUBJECTIVE:  RN reports ETT repositioned overnight, difficulty with volumes being returned this am.  ETT repositioned with CXR confirmation of placement.    VITAL SIGNS: Temp:  [97.9 F (36.6 C)-98.8 F (37.1 C)] 97.9 F (36.6 C) (10/07 0900) Pulse Rate:  [80-132] 83 (10/07 0900) Resp:  [18-31] 31 (10/07 0900) BP: (125-177)/(62-94) 153/75 mmHg (10/07 0900) SpO2:  [67 %-100 %] 100 % (10/07 0900) FiO2 (%):  [100 %] 100 % (10/07 0800) Weight:  [205 lb (92.987 kg)-207 lb 7.3 oz (94.1 kg)] 207 lb 7.3 oz (94.1 kg) (10/07 0335)  HEMODYNAMICS:    VENTILATOR SETTINGS: Vent Mode:  [-] PRVC FiO2 (%):  [100 %] 100 % Set Rate:  [20 bmp-24 bmp] 24 bmp Vt Set:  [540 mL] 540 mL PEEP:  [5 cmH20-8 cmH20] 5 cmH20 Plateau Pressure:  [20 cmH20-26 cmH20] 23 cmH20  INTAKE / OUTPUT: Intake/Output     10/06 0701 - 10/07 0700 10/07 0701 - 10/08 0700   I.V. (mL/kg) 142.3 (1.5)    Total Intake(mL/kg) 142.3 (1.5)    Urine (mL/kg/hr) 125    Stool 150    Total Output 275     Net -132.7            PHYSICAL EXAMINATION: General: adult male in NAD on vent Neuro: Sedated. HEENT: [place]/AT. PERRL, sclerae anicteric. Cardiovascular: RRR, no M/R/G.  Lungs: Respirations even and unlabored, lungs bilaterally coarse, R>L Abdomen:  BS x hypoactive, soft, NT/ND, colostomy bag in place. Musculoskeletal: No gross deformities, no edema.  Skin: Intact, warm, no rashes.  LABS:  CBC  Recent Labs Lab 09/24/14 2214 09/25/14 0226  WBC 10.1 8.5  HGB 13.8 12.6*  HCT 44.7 42.0  PLT 166 162   Coag's  Recent Labs Lab 09/25/14 0226  APTT 28  INR 1.23   BMET  Recent Labs Lab 09/24/14 2214 09/25/14 0226  NA 144 139  K 4.8 4.9  CL 104 103  CO2 30 28  BUN 23 22  CREATININE 1.31 1.37*  GLUCOSE 126* 162*   Electrolytes  Recent Labs Lab 09/24/14 2214 09/25/14 0226  CALCIUM 9.5 9.0  MG  --  1.8  PHOS  --  2.9   Sepsis Markers  Recent Labs Lab 09/24/14 2226 09/25/14 0226  LATICACIDVEN 0.41* 1.4  PROCALCITON  --  <0.10   ABG  Recent Labs Lab 09/24/14 2235 09/25/14 0107 09/25/14 0458  PHART 7.114* 7.168* 7.519*  PCO2ART 99.6* 75.3* 30.9*  PO2ART 99.9 58.6* 100.0   Liver Enzymes  Recent Labs Lab 09/24/14 2214 09/25/14 0226  AST 23 20  ALT 14 12  ALKPHOS 63 60  BILITOT 0.3 0.3  ALBUMIN 3.8 3.3*   Cardiac Enzymes  Recent Labs Lab 09/24/14 2214 09/25/14 0226 09/25/14 0700  TROPONINI <0.30 <  0.30 <0.30  PROBNP 115.2 146.1*  --    Glucose  Recent Labs Lab 09/25/14 0400  GLUCAP 120*    Imaging Dg Chest Port 1 View  09/25/2014   CLINICAL DATA:  Subsequent encounter for ventilator dependence and pneumonia.  EXAM: PORTABLE CHEST - 1 VIEW  COMPARISON:  Earlier the same day  FINDINGS: 1055 hrs. Endotracheal tube tip is 5.2 cm above the base of carina. The NG tube passes into the stomach although the distal tip position is not included on the film. Lung volumes remain low with left base collapse/ consolidation and small left pleural effusion. Telemetry leads overlie the chest. The cardiopericardial silhouette is within normal limits for size.  IMPRESSION: Endotracheal tube tip is 5.2 cm above the base of the carina.   Electronically Signed   By: Kennith CenterEric  Mansell M.D.   On: 09/25/2014  11:13   Dg Chest Port 1 View  09/25/2014   CLINICAL DATA:  Endotracheal tube repositioning.  EXAM: PORTABLE CHEST - 1 VIEW  COMPARISON:  Chest radiograph performed earlier today at 12:31 a.m.  FINDINGS: The patient's endotracheal tube appears to end less than 1 cm above the carina. This could be retracted approximately 2 cm.  Enteric tube is seen extending below the diaphragm. Lung expansion is mildly improved. Small bilateral pleural effusions are again suspected. Bibasilar airspace opacities may reflect atelectasis or pneumonia. Given interstitial prominence, mild pulmonary edema could have a similar appearance. No pneumothorax is seen.  The cardiomediastinal silhouette is enlarged. No acute osseous abnormalities are identified.  IMPRESSION: 1. Endotracheal tube appears to end less than 1 cm above the carina. This could be retracted approximately 2 cm, as deemed clinically appropriate. 2. Persistent bibasilar airspace opacities may reflect atelectasis or pneumonia. Small bilateral pleural effusions suspected. Given underlying interstitial prominence, mild pulmonary edema could have a similar appearance. 3. Cardiomegaly noted.  These results were called by telephone at the time of interpretation on 09/25/2014 at 2:11 am to the ER team, who verbally acknowledged these results.   Electronically Signed   By: Roanna RaiderJeffery  Chang M.D.   On: 09/25/2014 02:12   Dg Chest Portable 1 View  09/25/2014   CLINICAL DATA:  Endotracheal tube placement.  EXAM: PORTABLE CHEST - 1 VIEW  COMPARISON:  Chest radiograph performed 09/24/2014  FINDINGS: The patient's endotracheal tube appears to end within the right mainstem bronchus, deep to the carina. The carina is difficult to fully assess. However, on correlation with the prior study, the endotracheal tube should be retracted 4-5 cm.  The lungs are hypoexpanded. The cardiomediastinal silhouette is mildly enlarged. Vascular congestion and vascular crowding are seen. Bibasilar airspace  opacification may reflect atelectasis or pneumonia. Small bilateral pleural effusions are suspected. No pneumothorax is seen.  No acute osseous abnormalities are identified. An enteric tube is noted ending overlying the body of the stomach.  IMPRESSION: 1. Endotracheal tube appears to end within the right mainstem bronchus, deep to the carina. On correlation with the prior study, the endotracheal tube should be retracted 4-5 cm. 2. Lungs hypoexpanded. Vascular congestion and mild cardiomegaly noted. Bibasilar airspace opacification may reflect atelectasis or pneumonia, though interstitial edema might have a similar appearance. Small bilateral pleural effusions suspected.  These results were called by telephone at the time of interpretation on 09/25/2014 at 1:30 am to Dr. Derwood KaplanAnkit Nanavati, who verbally acknowledged these results.   Electronically Signed   By: Roanna RaiderJeffery  Chang M.D.   On: 09/25/2014 01:30   Dg Chest Portable 1 View  09/24/2014   CLINICAL DATA:  Initial encounter for cough and hypoxia. Dizziness in not feeling well for 1 week.  EXAM: PORTABLE CHEST - 1 VIEW  COMPARISON:  One-view chest 05/25/2014  FINDINGS: The heart is enlarged. The lung volumes are low. New bilateral effusions are present. Asymmetric right basilar airspace disease is present. Mild pulmonary vascular congestion is noted.  IMPRESSION: 1. Cardiomegaly and mild pulmonary vascular congestion. 2. New bilateral pleural effusion. 3. Right greater left basilar airspace disease. While this may represent atelectasis, there is concern for pneumonia, particularly at the right base.   Electronically Signed   By: Gennette Pac M.D.   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 Small Left Pleural Effusion  Tobacco abuse P:   Full mechanical support, 8 cc/kg. VAP bundle. Daily SBT /  WUA DuoNebs / PRN Albuterol. Solumedrol 60mg  q6hrs, consider reduction in am 10/8 Trend CXR Reassess ABG now Smoking cessation counseling once extubated.  CARDIOVASCULAR A:  Hx HTN, HLD - BNP, lactate, cortisol, troponin wnl. P:  TTE pending Hold outpatient lotensin, cardura, pravachol. Resume home norvasc, reduced dose coreg   RENAL A:   No acute issues P:   NS @ KVO. BMP in AM.  GASTROINTESTINAL A:   GI prophylaxis Nutrition P:   SUP: Pantoprazole. NPO. Initiate TF   HEMATOLOGIC A:   VTE Prophylaxis P:  SCD's / Lovenox. CBC in AM.  INFECTIOUS A:   CAP - left consolidation P:   BCx2 10/7 >> UCx 10/7 >> Sputum Cx 10/7 >> RVP 10/7 >> U Legionella 10/7 >> U Strep 10/7 >> neg  Abx: Ceftriaxone, start date 10/6, day 2/x. Abx: Azithromycin, start date 10/6, day 2/x. Procalcitonin algorithm to limit abx exposure.  ENDOCRINE A:   DM   P:   ICU hyperglycemia protocol. Hold outpatient metformin.  NEUROLOGIC A:   Acute metabolic encephalopathy P:   Sedation:  Propofol gtt / Fentanyl PRN. RASS goal: 0 to -1. Daily WUA.   TODAY'S SUMMARY: [name] admitted with AECOPD and CAP.  Await pan cultures, continue empiric abx.  Begin TF.  Resume home norvasc, reduced dose coreg.  Daily SBT / WUA.  F/U ABG now  Family Meeting:  Brother updated at beside.     Canary Brim, NP-C  Pulmonary & Critical Care Pgr: [phone] or [phone]  Attending:  I have seen and examined the patient with nurse practitioner/resident and agree with the note above.   We had to emergently re-intubate this morning due to tube malposition, now better Start tube feeds  Additional CC time by me today : 45 minutes.  Heber Millen, MD  PCCM Pager: [phone] Cell: [phone] If no response, call [phone]   09/25/2014, 11:29 AM

## 2014-09-25 NOTE — H&P (Signed)
PULMONARY / CRITICAL CARE MEDICINE   Name: Philip [name] MRN: [id number] DOB: 07-Aug-1946  PCP Ezequiel Kayser, MD    ADMISSION DATE:  09/24/2014 CONSULTATION DATE:  09/25/2014  REFERRING MD :  EDP  CHIEF COMPLAINT:  SOB  INITIAL PRESENTATION: [name] brought to [facility] ED on 10/6 with SOB, cough, dizziness.  In ED, CXR suggestive of PNA.  He failed BiPAP and required intubation, PCCM consulted for admission.   STUDIES:  CXR 10/6 >>> vascular congestion, b/l pleural effusions, RLL atx vs infiltrate.  SIGNIFICANT EVENTS: 10/6 - presented to ED, started on BiPAP but failed, required intubation.   HISTORY OF PRESENT ILLNESS:  Pt is encephalopathic; therefore, this HPI is obtained from chart review. Philip [name] is a [name] with PMH as outlined below who presented to [facility] ED on 10/6 with SOB, cough, and dizziness.  Per family, pt had been having a cough for the past few days.  No recent fevers/chills/sweats, chest pain, N/V/D, abdominal pain.  No recent travel or exposure to known sick contacts.  In ED, SpO2 was 67% on RA and pt was subsequently placed on a NRB.  ABG revealed respiratory acidosis, pt was started on BiPAP; however, he failed and became more lethargic therefore, he was intubated by EDP. PCCM was consulted for admission.  PAST MEDICAL HISTORY :  Past Medical History  Diagnosis Date  . Alcohol abuse, in remission 2004  . Diabetes mellitus     Type II  . HTN (hypertension)   . Hyperlipidemia   . History of prostate cancer   . Vitamin D deficiency   . Tobacco abuse   . History of colostomy 2004  . History of MRSA infection 2009  . Intestinal ischemia 2004   Past Surgical History  Procedure Laterality Date  . 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 Colostomy     Prior to Admission medications   Medication Sig Start Date End Date Taking? Authorizing Provider  albuterol (PROVENTIL) (2.5 MG/3ML) 0.083% nebulizer solution Take 3 mLs (2.5 mg total) by nebulization  every 6 (six) hours as needed for wheezing. 11/22/11 11/21/12  Ezequiel Kayser, MD  amLODipine (NORVASC) 5 MG tablet Take 5 mg by mouth daily.      Historical Provider, MD  aspirin 81 MG tablet Take 81 mg by mouth daily.      Historical Provider, MD  benazepril (LOTENSIN) 20 MG tablet Take 20 mg by mouth daily.    Historical Provider, MD  carvedilol (COREG) 25 MG tablet Take 25 mg by mouth 2 (two) times daily with a meal.      Historical Provider, MD  cholecalciferol (VITAMIN D) 1000 UNITS tablet Take 1,000 Units by mouth daily.      Historical Provider, MD  doxazosin (CARDURA) 2 MG tablet Take 2 mg by mouth daily.    Historical Provider, MD  metFORMIN (GLUCOPHAGE) 500 MG tablet Take 500 mg by mouth 2 (two) times daily with a meal.      Historical Provider, MD  pravastatin (PRAVACHOL) 20 MG tablet Take 20 mg by mouth daily.    Historical Provider, MD   No Known Allergies  FAMILY HISTORY:  History reviewed. No pertinent family history. SOCIAL HISTORY:  reports that he quit smoking about 3 years ago. His smoking use included Cigarettes. He smoked 0.00 packs per day. He has never used smokeless tobacco. He reports that he does not drink alcohol or use illicit drugs.  REVIEW OF SYSTEMS:  Unable to obtain as pt is encephalopathic.  SUBJECTIVE:   VITAL SIGNS: Temp:  [98.6 F (37 C)] 98.6 F (37 C) (10/06 2134) Pulse Rate:  [92-132] 99 (10/07 0020) Resp:  [18-21] 21 (10/07 0020) BP: (154-177)/(62-91) 154/91 mmHg (10/07 0020) SpO2:  [67 %-100 %] 100 % (10/07 0020) FiO2 (%):  [100 %] 100 % (10/07 0020) Weight:  [92.987 kg (205 lb)] 92.987 kg (205 lb) (10/06 2134) HEMODYNAMICS:   VENTILATOR SETTINGS: Vent Mode:  [-] BIPAP FiO2 (%):  [100 %] 100 % Set Rate:  [20 bmp] 20 bmp PEEP:  [5 cmH20] 5 cmH20 INTAKE / OUTPUT: Intake/Output   None     PHYSICAL EXAMINATION: General: WDWN male, sedated on vent. Neuro: Sedated. HEENT: [place]/AT. PERRL, sclerae anicteric. Cardiovascular: RRR, no M/R/G.   Lungs: Respirations even and unlabored.  Crackles bilaterally, expiratory wheeze.  On vent. Abdomen: BS x hypoactive, soft, NT/ND, colostomy bag in place. Musculoskeletal: No gross deformities, no edema.  Skin: Intact, warm, no rashes.  LABS:  CBC  Recent Labs Lab 09/24/14 2214  WBC 10.1  HGB 13.8  HCT 44.7  PLT 166   Coag's No results found for this basename: APTT, INR,  in the last 168 hours BMET  Recent Labs Lab 09/24/14 2214  NA 144  K 4.8  CL 104  CO2 30  BUN 23  CREATININE 1.31  GLUCOSE 126*   Electrolytes  Recent Labs Lab 09/24/14 2214  CALCIUM 9.5   Sepsis Markers  Recent Labs Lab 09/24/14 2226  LATICACIDVEN 0.41*   ABG  Recent Labs Lab 09/24/14 2235  PHART 7.114*  PCO2ART 99.6*  PO2ART 99.9   Liver Enzymes  Recent Labs Lab 09/24/14 2214  AST 23  ALT 14  ALKPHOS 63  BILITOT 0.3  ALBUMIN 3.8   Cardiac Enzymes  Recent Labs Lab 09/24/14 2214  TROPONINI <0.30  PROBNP 115.2   Glucose No results found for this basename: GLUCAP,  in the last 168 hours  Imaging Dg Chest Portable 1 View  09/24/2014   CLINICAL DATA:  Initial encounter for cough and hypoxia. Dizziness in not feeling well for 1 week.  EXAM: PORTABLE CHEST - 1 VIEW  COMPARISON:  One-view chest 05/25/2014  FINDINGS: The heart is enlarged. The lung volumes are low. New bilateral effusions are present. Asymmetric right basilar airspace disease is present. Mild pulmonary vascular congestion is noted.  IMPRESSION: 1. Cardiomegaly and mild pulmonary vascular congestion. 2. New bilateral pleural effusion. 3. Right greater left basilar airspace disease. While this may represent atelectasis, there is concern for pneumonia, particularly at the right base.   Electronically Signed   By: Gennette Pac M.D.   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 Tobacco use disorder P:   Full mechanical support, wean as able. VAP bundle. SBT in AM. DuoNebs / Albuterol. Solumedrol 60mg  q6hrs. ABG and CXR in AM. Smoking cessation.  CARDIOVASCULAR A:  Hx HTN, HLD P:  TTE. Check BNP, lactate, cortisol. Trend troponin. Hold outpatient norvasc, lotensin, coreg, cardura, pravachol.  RENAL A:   No acute issues P:   NS @ KVO. BMP in AM.  GASTROINTESTINAL A:   GI prophylaxis Nutrition P:   SUP: Pantoprazole. NPO. TF if remains NPO > 24 hours.  HEMATOLOGIC A:   VTE Prophylaxis P:  SCD's / Lovenox. CBC in AM.  INFECTIOUS A:  CAP P:   BCx2 10/7 >>> UCx 10/7 >>> Sputum Cx 10/7 >>> RVP 10/7 >>> U Legionella 10/7 >>> U Strep 10/7 >>> Abx: Ceftriaxone, start date 10/6, day 1/x. Abx: Azithromycin, start date 10/6, day 1/x. Procalcitonin algorithm to limit abx exposure.  ENDOCRINE A:   DM   P:   ICU hyperglycemia protocol. Hold outpatient metformin.  NEUROLOGIC A:   Acute metabolic encephalopathy P:   Sedation:  Propofol gtt / Fentanyl PRN. RASS goal: 0 to -1. Daily WUA.   TODAY'S SUMMARY: [name] admitted with AECOPD and CAP.  Intubated by EDP.  Pan culture + empiric abx, SBT in AM.  Family Meeting:  Brother updated at bedside by PA   Rutherford Guysahul Desai, PA - [facility] [place] [facility] Pgr: [phone]  or [phone] 09/25/2014, 12:46 AM    STAFF NOTE: I, Dr Lavinia SharpsM Rajvi Armentor have personally reviewed patient's available data, including medical history, events of note, physical examination and test results as part of my evaluation. I have discussed with resident/NP and other care providers such as pharmacist, RN and RRT.  In addition,  I personally evaluated patient and elicited key findings of acute hypercarbic and hypoxemic resp failure on account of CAP and AECOPD. NEed to rule out chf nos and NSTEMI. Keep intubated.   Rest per NP/medical resident whose note is outlined above and  that I agree with  The patient is critically ill with multiple organ systems failure and requires high complexity decision making for assessment and support, frequent evaluation and titration of therapies, application of advanced monitoring technologies and extensive interpretation of multiple databases.   Critical Care Time devoted to patient care services described in this note is  45  Minutes.  Dr. Kalman ShanMurali Adaeze Better, M.D., [facility] Hospital[facility].C.C.P Pulmonary and Critical Care Medicine Staff Physician [facility] [place] [facility] Pager: [phone], If no answer or between  15:00h - 7:00h: call [phone]  09/25/2014 1:47 AM        Pulmonary and Critical Care Medicine [facility] Pager: [phone]

## 2014-09-25 NOTE — Care Management Note (Addendum)
    Page 1 of 2   10/14/2014     10:41:05 AM CARE MANAGEMENT NOTE 10/14/2014  Patient:  Philip [name],Philip [name]   Account Number:  [id number]  Date Initiated:  09/25/2014  Documentation initiated by:  DAVIS,RHONDA  Subjective/Objective Assessment:   resp failure requiring intubation     Action/Plan:   home when stable   Anticipated DC Date:  10/17/2014   Anticipated [facility] Plan[facility]  [facility]  In-house referral  Clinical Social Worker  Hospice / Palliative Care  Chaplain      DC Planning Services  CM consult      [facility]   Choice offered to / List presented to:  NA   DME arranged  NA      DME agency  NA     HH arranged  NA      HH agency  NA   Status of service:  In process, will continue to follow Medicare Important Message given?   (If response is "NO", the following Medicare IM given date fields will be blank) Date Medicare IM given:   Medicare IM given by:   Date Additional Medicare IM given:   Additional Medicare IM given by:    Discharge Disposition:    Per UR Regulation:  Reviewed for med. necessity/level of care/duration of stay  If discussed at Long Length of Stay Meetings, dates discussed:   10/01/2014  10/03/2014  10/08/2014  10/10/2014  10/14/2014    Comments:  [id number]/Rhonda Earlene Plateravis, RN, BSN, CCM Chart reviewed. Discharge needs and patient's stay to be reviewed and followed by case manager. Possible terminal weaning within the next few days dependant of conversations with family.  [id number]/Rhonda Earlene Plateravis, RN, BSN, CCM Chart reviewed. Discharge needs and patient's stay to be reviewed and followed by case manager. day one of vent0required reintubation on 1-222-15 failed to improve sats on bipap.  [id number]/Rhonda Davis,RN,BSN,CCM: Temp:  [98.2 F (36.8 C)-98.7 F (37.1 C)] 98.4 F (36.9 C) (10/19 0800) Pulse Rate:  [79-123] 121 (10/19 0841) Resp:  [16-28] 25 (10/19 0841) BP: (105-164)/(49-85) 126/78 mmHg (10/19 0841) SpO2:   [96 %-100 %] 100 % (10/19 0841) FiO2 (%):  [40 %] 40 % (10/19 0820)   [id number]?Rhonda Davis,RN,BSN,CCM: is needing increased supplement oxygen.  is not able to mobilize his secretions >> md will add mucomyst, chest PT, and change to schedule duoneb.  Continue supplemental oxygen >> he is not using BiPAP.  Will add IV fluid with increased creatinine, and d/c Lotensin.  F/u CXR 10/16.  [id number]/Rhonda Davis,RN,BSN,CCM: Vital Sign Min/Max (last 24 hours)   Value Min Max   Temp ! 96.9 F (36.1 C) 99 F (37.2 C)   Pulse Rate 79 ! 107   ECG Heart Rate 80 ! 108   Resp 19 ! 35   BP: Systolic 107 mmHg ! 174 mmHg   BP: Diastolic 65 mmHg ! 106 mmHg   FiO2 (%) 40 % 45 %  [id number]/replaced back on bipap   SpO2 ! 89 % 99 %   [id number]/Rhonda Earlene Plateravis, RN, BSN, CCM Chart reviewed. Discharge needs and patient's stay to be reviewed and followed by case manager.

## 2014-09-25 NOTE — ED Notes (Signed)
Pt's brother Lucita Ferraraathaniel Falwell [phone]

## 2014-09-25 NOTE — Progress Notes (Signed)
I agree with dietitian's note.  Charlott RakesHeather Lyrical Sowle MS, RD, LDN [phone] Pager [phone] Weekend/After Hours Pager

## 2014-09-25 NOTE — Progress Notes (Signed)
*  PRELIMINARY RESULTS* Echocardiogram 2D Echocardiogram has been performed.  Jeryl ColumbiaLLIOTT, Kriston Pasquarello 09/25/2014, 11:13 AM

## 2014-09-25 NOTE — Progress Notes (Signed)
ANTIBIOTIC CONSULT NOTE - INITIAL  Pharmacy Consult for azithromycin, ceftriaxone Indication: pneumonia  No Known Allergies  Patient Measurements: Height: 5\' 10"  (177.8 cm) Weight: 205 lb (92.987 kg) IBW/kg (Calculated) : 73 Adjusted Body Weight:   Vital Signs: Temp: 98.6 F (37 C) (10/06 2134) Temp Source: Oral (10/06 2134) BP: 154/91 mmHg (10/07 0020) Pulse Rate: 99 (10/07 0020) Intake/Output from previous day:   Intake/Output from this shift:    Labs:  Recent Labs  09/24/14 2214  WBC 10.1  HGB 13.8  PLT 166  CREATININE 1.31   Estimated Creatinine Clearance: 61 ml/min (by C-G formula based on Cr of 1.31). No results found for this basename: VANCOTROUGH, VANCOPEAK, VANCORANDOM, GENTTROUGH, GENTPEAK, GENTRANDOM, TOBRATROUGH, TOBRAPEAK, TOBRARND, AMIKACINPEAK, AMIKACINTROU, AMIKACIN,  in the last 72 hours   Microbiology: No results found for this or any previous visit (from the past 720 hour(s)).  Medical History: Past Medical History  Diagnosis Date  . Alcohol abuse, in remission 2004  . Diabetes mellitus     Type II  . HTN (hypertension)   . Hyperlipidemia   . History of prostate cancer   . Vitamin D deficiency   . Tobacco abuse   . History of colostomy 2004  . History of MRSA infection 2009  . Intestinal ischemia 2004    Medications:  Anti-infectives   Start     Dose/Rate Route Frequency Ordered Stop   09/25/14 2200  azithromycin (ZITHROMAX) 500 mg in dextrose 5 % 250 mL IVPB     500 mg 250 mL/hr over 60 Minutes Intravenous Every 24 hours 09/25/14 0121     09/25/14 2200  cefTRIAXone (ROCEPHIN) 2 g in dextrose 5 % 50 mL IVPB     2 g 100 mL/hr over 30 Minutes Intravenous Every 24 hours 09/25/14 0121     09/24/14 2330  cefTRIAXone (ROCEPHIN) 2 g in dextrose 5 % 50 mL IVPB     2 g 100 mL/hr over 30 Minutes Intravenous  Once 09/24/14 2316 09/25/14 0001   09/24/14 2330  azithromycin (ZITHROMAX) 500 mg in dextrose 5 % 250 mL IVPB     500 mg 250 mL/hr  over 60 Minutes Intravenous  Once 09/24/14 2316       Assessment: Patient with PNA.   First dose of antibiotics already given.  Goal of Therapy:  Rocephin/azithromycin based on manufacturer dosing recommendations.   Plan:  Follow up culture results Ceftriaxone 1gm iv q24hr Azithromycin 500mg  iv q24hr  Darlina GuysGrimsley Jr, Jacquenette ShoneJulian Crowford 09/25/2014,1:22 AM

## 2014-09-25 NOTE — Progress Notes (Addendum)
CXR reviewed post adjustment of ETT.  Advance ETT 2 cm into the body.  Discussed with RT.  Reassess ABG at 1140   Canary BrimBrandi Tempestt Silba, NP-C [facility] Pgr: [phone] or [phone]

## 2014-09-26 ENCOUNTER — Inpatient Hospital Stay (HOSPITAL_COMMUNITY): Payer: Medicare Other

## 2014-09-26 DIAGNOSIS — J9622 Acute and chronic respiratory failure with hypercapnia: Secondary | ICD-10-CM

## 2014-09-26 DIAGNOSIS — E119 Type 2 diabetes mellitus without complications: Secondary | ICD-10-CM

## 2014-09-26 LAB — BASIC METABOLIC PANEL
ANION GAP: 13 (ref 5–15)
BUN: 26 mg/dL — AB (ref 6–23)
CALCIUM: 9.3 mg/dL (ref 8.4–10.5)
CO2: 27 mEq/L (ref 19–32)
Chloride: 104 mEq/L (ref 96–112)
Creatinine, Ser: 1.06 mg/dL (ref 0.50–1.35)
GFR calc non Af Amer: 70 mL/min — ABNORMAL LOW (ref >90)
GFR, EST AFRICAN AMERICAN: 81 mL/min — AB (ref >90)
Glucose, Bld: 174 mg/dL — ABNORMAL HIGH (ref 70–99)
Potassium: 3.9 mEq/L (ref 3.7–5.3)
Sodium: 144 mEq/L (ref 137–147)

## 2014-09-26 LAB — GLUCOSE, CAPILLARY
GLUCOSE-CAPILLARY: 160 mg/dL — AB (ref 70–99)
GLUCOSE-CAPILLARY: 165 mg/dL — AB (ref 70–99)
Glucose-Capillary: 122 mg/dL — ABNORMAL HIGH (ref 70–99)
Glucose-Capillary: 140 mg/dL — ABNORMAL HIGH (ref 70–99)
Glucose-Capillary: 176 mg/dL — ABNORMAL HIGH (ref 70–99)
Glucose-Capillary: 183 mg/dL — ABNORMAL HIGH (ref 70–99)
Glucose-Capillary: 190 mg/dL — ABNORMAL HIGH (ref 70–99)

## 2014-09-26 LAB — PROCALCITONIN: Procalcitonin: <0.1 ng/mL

## 2014-09-26 LAB — LEGIONELLA ANTIGEN, URINE

## 2014-09-26 MED ORDER — LABETALOL HCL 5 MG/ML IV SOLN
10.0000 mg | INTRAVENOUS | Status: DC | PRN
  Administered 2014-09-26 – 2014-09-28 (×9): 10 mg via INTRAVENOUS
  Filled 2014-09-26 (×8): qty 4

## 2014-09-26 MED ORDER — DOCUSATE SODIUM 100 MG PO CAPS
100.0000 mg | ORAL_CAPSULE | Freq: Every day | ORAL | Status: DC
  Administered 2014-09-26 – 2014-10-10 (×15): 100 mg via ORAL
  Filled 2014-09-26 (×16): qty 1

## 2014-09-26 MED ORDER — METHYLPREDNISOLONE SODIUM SUCC 125 MG IJ SOLR
60.0000 mg | Freq: Two times a day (BID) | INTRAMUSCULAR | Status: DC
  Administered 2014-09-26 – 2014-10-01 (×10): 60 mg via INTRAVENOUS
  Filled 2014-09-26 (×10): qty 2

## 2014-09-26 NOTE — Procedures (Signed)
Extubation Procedure Note  Patient Details:   Name: Philip [name] DOB: 02/09/1946 MRN: [id number]   Airway Documentation:     Evaluation  O2 sats: stable throughout Complications: No apparent complications Patient did tolerate procedure well. Bilateral Breath Sounds: Clear Suctioning: Airway Yes  Pt extubated per MD order. Pt had positive cuff leak. Pt was placed on 3.5 LPM. Pt had good strong productive cough. Pt was able to vocalize name. BBS clear. No stridor noted. RT will continue to monitor.  Nelton Amsden M 09/26/2014, 9:08 AM

## 2014-09-26 NOTE — Progress Notes (Signed)
PULMONARY / CRITICAL CARE MEDICINE   Name: Philip [name] MRN: [id number] DOB: [birth year]  PCP Ezequiel Kayser, MD    ADMISSION DATE:  09/24/2014 CONSULTATION DATE:  09/26/2014  REFERRING MD :  EDP  CHIEF COMPLAINT:  SOB  INITIAL PRESENTATION: [name] brought to [facility] ED on 10/6 with SOB, cough, dizziness.  In ED, CXR suggestive of PNA.  He failed BiPAP and required intubation, PCCM consulted for admission.  STUDIES:  CXR 10/6 >> vascular congestion, b/l pleural effusions, RLL atx vs infiltrate. TTE 10/7 >> EF 60-65%, mod concentric hypertrophy, doppler parameters c/w gd I diastolic dysfxn   SIGNIFICANT EVENTS: 10/6  presented to ED, started on BiPAP but failed, required intubation. 10/7  ETT repositioned with poor volume return on vent, to 25 cm at teeth 10/8. Passed SBT, fully awake. Extubated.   SUBJECTIVE:   Fully awake, no distress. Passed SBT.   VITAL SIGNS: Temp:  [97.2 F (36.2 C)-99.5 F (37.5 C)] 98.6 F (37 C) (10/08 0700) Pulse Rate:  [77-107] 97 (10/08 0736) Resp:  [14-40] 20 (10/08 0736) BP: (141-180)/(67-96) 169/77 mmHg (10/08 0736) SpO2:  [95 %-100 %] 98 % (10/08 0732) FiO2 (%):  [40 %-100 %] 40 % (10/08 0736) Weight:  [95.2 kg (209 lb 14.1 oz)] 95.2 kg (209 lb 14.1 oz) (10/08 0355)  HEMODYNAMICS:    VENTILATOR SETTINGS: Vent Mode:  [-] PSV;CPAP FiO2 (%):  [40 %-100 %] 40 % Set Rate:  [16 bmp-24 bmp] 16 bmp Vt Set:  [540 mL] 540 mL PEEP:  [8 cmH20] 8 cmH20 Pressure Support:  [8 cmH20] 8 cmH20 Plateau Pressure:  [22 cmH20-27 cmH20] 25 cmH20  INTAKE / OUTPUT: Intake/Output     10/07 0701 - 10/08 0700 10/08 0701 - 10/09 0700   I.V. (mL/kg) 706.8 (7.4)    NG/GT 808    IV Piggyback 300    Total Intake(mL/kg) 1814.8 (19.1)    Urine (mL/kg/hr) 1225 (0.5)    Stool 225 (0.1)    Total Output 1450     Net +364.8            PHYSICAL EXAMINATION: General: adult male in NAD on vent Neuro: Sedated. HEENT: [place]/AT. PERRL, sclerae  anicteric. Cardiovascular: RRR, no M/R/G.  Lungs: Respirations even and unlabored, lungs bilaterally clear  Abdomen: BS x hypoactive, soft, NT/ND, colostomy bag in place. Musculoskeletal: No gross deformities, no edema.  Skin: Intact, warm, no rashes.  LABS:  CBC  Recent Labs Lab 09/24/14 2214 09/25/14 0226  WBC 10.1 8.5  HGB 13.8 12.6*  HCT 44.7 42.0  PLT 166 162   Coag's  Recent Labs Lab 09/25/14 0226  APTT 28  INR 1.23   BMET  Recent Labs Lab 09/24/14 2214 09/25/14 0226  NA 144 139  K 4.8 4.9  CL 104 103  CO2 30 28  BUN 23 22  CREATININE 1.31 1.37*  GLUCOSE 126* 162*   Electrolytes  Recent Labs Lab 09/24/14 2214 09/25/14 0226  CALCIUM 9.5 9.0  MG  --  1.8  PHOS  --  2.9   Sepsis Markers  Recent Labs Lab 09/24/14 2226 09/25/14 0226 09/26/14 0330  LATICACIDVEN 0.41* 1.4  --   PROCALCITON  --  <0.10 <0.10   ABG  Recent Labs Lab 09/25/14 0107 09/25/14 0458 09/25/14 1136  PHART 7.168* 7.519* 7.566*  PCO2ART 75.3* 30.9* 27.0*  PO2ART 58.6* 100.0 102.0*   Liver Enzymes  Recent Labs Lab 09/24/14 2214 09/25/14 0226  AST 23 20  ALT 14 12  ALKPHOS 63 60  BILITOT 0.3 0.3  ALBUMIN 3.8 3.3*   Cardiac Enzymes  Recent Labs Lab 09/24/14 2214 09/25/14 0226 09/25/14 0700 09/25/14 1225  TROPONINI <0.30 <0.30 <0.30 <0.30  PROBNP 115.2 146.1*  --   --    Glucose  Recent Labs Lab 09/25/14 1202 09/25/14 1351 09/25/14 1629 09/25/14 1952 09/25/14 2332 09/26/14 0353  GLUCAP 144* 141* 188* 160* 183* 165*    Imaging Dg Chest Port 1 View  09/26/2014   CLINICAL DATA:  68 year old ventilated patient. Evaluate endotracheal tube position.  EXAM: PORTABLE CHEST - 1 VIEW  COMPARISON:  Chest x-ray 09/25/2014.  FINDINGS: An endotracheal tube is in place with tip 2.7 cm above the carina. A nasogastric tube is seen extending into the stomach, however, the tip of the nasogastric tube extends below the lower margin of the image. Lung volumes are  low. There are bibasilar opacities (left greater than right) favored to predominantly reflect subsegmental atelectasis at the right base, however, there is likely a combination of atelectasis and airspace consolidation in the left lower lobe, with small left pleural effusion. No evidence of pulmonary edema. Heart size appears borderline enlarged, likely accentuated by low lung volumes and patient rotation, which also distorts mediastinal contours.  IMPRESSION: 1. Support apparatus, as above. 2. Persistent atelectasis and consolidation in the left lower lobe with small left pleural effusion, and minimal subsegmental atelectasis in the right lung base.   Electronically Signed   By: Trudie Reed M.D.   On: 09/26/2014 07:24   Dg Chest Port 1 View  09/25/2014   CLINICAL DATA:  Subsequent encounter for ventilator dependence and pneumonia.  EXAM: PORTABLE CHEST - 1 VIEW  COMPARISON:  Earlier the same day  FINDINGS: 1055 hrs. Endotracheal tube tip is 5.2 cm above the base of carina. The NG tube passes into the stomach although the distal tip position is not included on the film. Lung volumes remain low with left base collapse/ consolidation and small left pleural effusion. Telemetry leads overlie the chest. The cardiopericardial silhouette is within normal limits for size.  IMPRESSION: Endotracheal tube tip is 5.2 cm above the base of the carina.   Electronically Signed   By: Kennith Center M.D.   On: 09/25/2014 11:13   Dg Chest Port 1 View  09/25/2014   CLINICAL DATA:  Endotracheal tube repositioning.  EXAM: PORTABLE CHEST - 1 VIEW  COMPARISON:  Chest radiograph performed earlier today at 12:31 a.m.  FINDINGS: The patient's endotracheal tube appears to end less than 1 cm above the carina. This could be retracted approximately 2 cm.  Enteric tube is seen extending below the diaphragm. Lung expansion is mildly improved. Small bilateral pleural effusions are again suspected. Bibasilar airspace opacities may reflect  atelectasis or pneumonia. Given interstitial prominence, mild pulmonary edema could have a similar appearance. No pneumothorax is seen.  The cardiomediastinal silhouette is enlarged. No acute osseous abnormalities are identified.  IMPRESSION: 1. Endotracheal tube appears to end less than 1 cm above the carina. This could be retracted approximately 2 cm, as deemed clinically appropriate. 2. Persistent bibasilar airspace opacities may reflect atelectasis or pneumonia. Small bilateral pleural effusions suspected. Given underlying interstitial prominence, mild pulmonary edema could have a similar appearance. 3. Cardiomegaly noted.  These results were called by telephone at the time of interpretation on 09/25/2014 at 2:11 am to the ER team, who verbally acknowledged these results.   Electronically Signed   By: Roanna Raider M.D.   On: 09/25/2014 02:12  Dg Chest Portable 1 View  09/25/2014   CLINICAL DATA:  Endotracheal tube placement.  EXAM: PORTABLE CHEST - 1 VIEW  COMPARISON:  Chest radiograph performed 09/24/2014  FINDINGS: The patient's endotracheal tube appears to end within the right mainstem bronchus, deep to the carina. The carina is difficult to fully assess. However, on correlation with the prior study, the endotracheal tube should be retracted 4-5 cm.  The lungs are hypoexpanded. The cardiomediastinal silhouette is mildly enlarged. Vascular congestion and vascular crowding are seen. Bibasilar airspace opacification may reflect atelectasis or pneumonia. Small bilateral pleural effusions are suspected. No pneumothorax is seen.  No acute osseous abnormalities are identified. An enteric tube is noted ending overlying the body of the stomach.  IMPRESSION: 1. Endotracheal tube appears to end within the right mainstem bronchus, deep to the carina. On correlation with the prior study, the endotracheal tube should be retracted 4-5 cm. 2. Lungs hypoexpanded. Vascular congestion and mild cardiomegaly noted. Bibasilar  airspace opacification may reflect atelectasis or pneumonia, though interstitial edema might have a similar appearance. Small bilateral pleural effusions suspected.  These results were called by telephone at the time of interpretation on 09/25/2014 at 1:30 am to Dr. Derwood Kaplan, who verbally acknowledged these results.   Electronically Signed   By: Roanna Raider M.D.   On: 09/25/2014 01:30   Dg Chest Portable 1 View  09/24/2014   CLINICAL DATA:  Initial encounter for cough and hypoxia. Dizziness in not feeling well for 1 week.  EXAM: PORTABLE CHEST - 1 VIEW  COMPARISON:  One-view chest 05/25/2014  FINDINGS: The heart is enlarged. The lung volumes are low. New bilateral effusions are present. Asymmetric right basilar airspace disease is present. Mild pulmonary vascular congestion is noted.  IMPRESSION: 1. Cardiomegaly and mild pulmonary vascular congestion. 2. New bilateral pleural effusion. 3. Right greater left basilar airspace disease. While this may represent atelectasis, there is concern for pneumonia, particularly at the right base.   Electronically Signed   By: Gennette Pac M.D.   On: 09/24/2014 23:14   10/8: Left > right airspace disease (predominantely left, can't exclude sm effusion)  ASSESSMENT / PLAN:  PULMONARY OETT 10/7 >>> A: Acute on chronic hypoxemic and hypercarbic respiratory failure in setting of CAP + AECOPD AECOPD - no PFT's found in system. Small Left Pleural Effusion  Tobacco abuse> quit 2010 PCXR stable. No distress. Passed SBT.   P:   Extubate and wean FIO2  DuoNebs / PRN Albuterol. Solumedrol to 60mg  q 12  Trend CXR PRN Smoking cessation counseling once extubated. Mobilize s/p extubation  Try to get outpatient PFT results Likely Spiriva tomorrow  CARDIOVASCULAR A:  Hx HTN, HLD - BNP, lactate, cortisol, troponin wnl. P:  Hold outpatient lotensin, cardura, pravachol. cont home norvasc, reduced dose coreg   RENAL A:   No acute issues P:   NS @  KVO. BMP in AM.  GASTROINTESTINAL A:   GI prophylaxis Nutrition P:   SUP: Pantoprazole. Sips post extubation and adv as tol   HEMATOLOGIC A:   VTE Prophylaxis P:  SCD's / Lovenox. CBC in AM.  INFECTIOUS A:   CAP - left consolidation P:   BCx2 10/7 >> UCx 10/7 >> Sputum Cx 10/7 >> RVP 10/7 >> U Legionella 10/7 >> U Strep 10/7 >> neg  Abx: Ceftriaxone, start date 10/6, day 3/x. Abx: Azithromycin, start date 10/6, day 3/x>>> STOP TODAY Procalcitonin algorithm to limit abx exposure.  ENDOCRINE A:   DM   P:  ICU hyperglycemia protocol. Hold outpatient metformin.  NEUROLOGIC A:   Acute metabolic encephalopathy P:   rass goal 0 D/c sedation .   TODAY'S SUMMARY: [name] admitted with AECOPD and CAP.  Passed SBT. Ready for extubation. Will d/c sedation. Taper steroids and cont supportive care   Family Meeting:  Brother updated at beside 10/8  Anders SimmondsPete Babcock ACNP-BC [facility] Pager # [phone] OR # [phone] if no answer   Attending:  I have seen and examined the patient with nurse practitioner/resident and agree with the note above.   Extubate today Stop Azithromycin Wean solumedrol Likely spiriva tomorrow Try to track down outpatient PFT  CC time 35 minutes  Heber CarolinaBrent Jamaica Inthavong, MD [place] [facility] Pager: [phone] Cell: [phone] If no response, call [phone]  09/26/2014, 8:20 AM

## 2014-09-26 NOTE — Progress Notes (Signed)
NUTRITION FOLLOW UP  Intervention:   - Encouraged continued excellent PO intake - RD to continue to monitor   Nutrition Dx:   Inadequate oral intake related to inability to eat as evidenced by NPO status - no long appropriate, diet advanced  New nutrition dx: Increased nutrient needs related to community acquired pneumonia and COPD as evidenced by MD notes.   Goal:  Pt to meet >/= 90% of their estimated nutrition needs - likely met based on RN/pt report of intake   Monitor:   Weights, labs, intake  Assessment:   [name] brought to [facility] ED on 10/6 with SOB, cough, dizziness. In ED, CXR suggestive of PNA. He failed BiPAP and required intubation.   10/7: - Per weight history documentation, pt weight has been stable.  - Per family, pt has not been eating well x 3 weeks d/t decreased appetite. Family states that pt may only eat a bowl of cereal all day, if eating at all. Cough and symptoms appeared around 2 weeks ago.  10/8: - Extubated this morning, diet advanced to diabetic diet, pt eating 100% of meals - Met with pt who reports not eating much of anything since Monday PTA due to not feeling well however before then had excellent appetite and was eating 3 meals/day with blood sugars under control   Height: Ht Readings from Last 1 Encounters:  09/24/14 $RemoveB'5\' 10"'lbywgyGd$  (1.778 m)    Weight Status:   Wt Readings from Last 1 Encounters:  09/26/14 209 lb 14.1 oz (95.2 kg)  Admit wt         205 lb (92.9 kg)   Re-estimated needs:  Kcal: 1900-2100 Protein: 90-110g Fluid: 1.9-2.1L/day   Skin: intact   Diet Order: Carb Control   Intake/Output Summary (Last 24 hours) at 09/26/14 1628 Last data filed at 09/26/14 1400  Gross per 24 hour  Intake   1450 ml  Output   1150 ml  Net    300 ml    Last BM: 10/7   Labs:   Recent Labs Lab 09/24/14 2214 09/25/14 0226 09/26/14 1028  NA 144 139 144  K 4.8 4.9 3.9  CL 104 103 104  CO2 $Re'30 28 27  'WqI$ BUN 23 22 26*  CREATININE 1.31 1.37*  1.06  CALCIUM 9.5 9.0 9.3  MG  --  1.8  --   PHOS  --  2.9  --   GLUCOSE 126* 162* 174*    CBG (last 3)   Recent Labs  09/25/14 2332 09/26/14 0353 09/26/14 0807  GLUCAP 183* 165* 176*    Scheduled Meds: . albuterol  10 mg/hr Nebulization Once  . amLODipine  5 mg Oral Daily  . antiseptic oral rinse  7 mL Mouth Rinse QID  . carvedilol  12.5 mg Oral BID WC  . cefTRIAXone (ROCEPHIN)  IV  2 g Intravenous Q24H  . chlorhexidine  15 mL Mouth Rinse BID  . docusate sodium  100 mg Oral Daily  . enoxaparin (LOVENOX) injection  40 mg Subcutaneous Q24H  . Influenza vac split quadrivalent PF  0.5 mL Intramuscular Tomorrow-1000  . insulin aspart  2-6 Units Subcutaneous 6 times per day  . ipratropium-albuterol  3 mL Nebulization Q4H  . methylPREDNISolone (SOLU-MEDROL) injection  60 mg Intravenous Q12H    Carlis Stable MS, RD, [place] [phone] Pager [phone] Weekend/After Hours Pager

## 2014-09-27 ENCOUNTER — Encounter (HOSPITAL_COMMUNITY): Payer: Self-pay | Admitting: *Deleted

## 2014-09-27 LAB — CBC
HCT: 46.6 % (ref 39.0–52.0)
Hemoglobin: 14.6 g/dL (ref 13.0–17.0)
MCH: 29.7 pg (ref 26.0–34.0)
MCHC: 31.3 g/dL (ref 30.0–36.0)
MCV: 94.7 fL (ref 78.0–100.0)
PLATELETS: 201 10*3/uL (ref 150–400)
RBC: 4.92 MIL/uL (ref 4.22–5.81)
RDW: 13.5 % (ref 11.5–15.5)
WBC: 17.8 10*3/uL — ABNORMAL HIGH (ref 4.0–10.5)

## 2014-09-27 LAB — COMPREHENSIVE METABOLIC PANEL
ALBUMIN: 3.1 g/dL — AB (ref 3.5–5.2)
ALT: 14 U/L (ref 0–53)
ANION GAP: 10 (ref 5–15)
AST: 20 U/L (ref 0–37)
Alkaline Phosphatase: 62 U/L (ref 39–117)
BILIRUBIN TOTAL: 0.3 mg/dL (ref 0.3–1.2)
BUN: 27 mg/dL — ABNORMAL HIGH (ref 6–23)
CALCIUM: 9.2 mg/dL (ref 8.4–10.5)
CHLORIDE: 103 meq/L (ref 96–112)
CO2: 30 meq/L (ref 19–32)
CREATININE: 1.13 mg/dL (ref 0.50–1.35)
GFR calc Af Amer: 75 mL/min — ABNORMAL LOW (ref >90)
GFR, EST NON AFRICAN AMERICAN: 64 mL/min — AB (ref >90)
Glucose, Bld: 137 mg/dL — ABNORMAL HIGH (ref 70–99)
Potassium: 4.7 mEq/L (ref 3.7–5.3)
Sodium: 143 mEq/L (ref 137–147)
Total Protein: 7.7 g/dL (ref 6.0–8.3)

## 2014-09-27 LAB — GLUCOSE, CAPILLARY
Glucose-Capillary: 121 mg/dL — ABNORMAL HIGH (ref 70–99)
Glucose-Capillary: 121 mg/dL — ABNORMAL HIGH (ref 70–99)
Glucose-Capillary: 141 mg/dL — ABNORMAL HIGH (ref 70–99)
Glucose-Capillary: 152 mg/dL — ABNORMAL HIGH (ref 70–99)
Glucose-Capillary: 191 mg/dL — ABNORMAL HIGH (ref 70–99)

## 2014-09-27 LAB — URINE CULTURE: Special Requests: NORMAL

## 2014-09-27 LAB — RESPIRATORY VIRUS PANEL
Adenovirus: NOT DETECTED
INFLUENZA A: NOT DETECTED
Influenza A H1: NOT DETECTED
Influenza A H3: NOT DETECTED
Influenza B: NOT DETECTED
Metapneumovirus: NOT DETECTED
PARAINFLUENZA 1 A: NOT DETECTED
PARAINFLUENZA 2 A: NOT DETECTED
Parainfluenza 3: NOT DETECTED
Respiratory Syncytial Virus A: NOT DETECTED
Respiratory Syncytial Virus B: NOT DETECTED
Rhinovirus: DETECTED — AB

## 2014-09-27 LAB — CULTURE, RESPIRATORY: SPECIAL REQUESTS: NORMAL

## 2014-09-27 LAB — PROCALCITONIN: Procalcitonin: <0.1 ng/mL

## 2014-09-27 LAB — CULTURE, RESPIRATORY W GRAM STAIN

## 2014-09-27 MED ORDER — LEVOFLOXACIN 750 MG PO TABS
750.0000 mg | ORAL_TABLET | Freq: Every day | ORAL | Status: AC
  Administered 2014-09-27 – 2014-10-03 (×7): 750 mg via ORAL
  Filled 2014-09-27 (×7): qty 1

## 2014-09-27 MED ORDER — PRAVASTATIN SODIUM 20 MG PO TABS
20.0000 mg | ORAL_TABLET | Freq: Every day | ORAL | Status: DC
  Administered 2014-09-27 – 2014-10-15 (×18): 20 mg via ORAL
  Filled 2014-09-27 (×20): qty 1

## 2014-09-27 MED ORDER — CARVEDILOL 12.5 MG PO TABS: 25.0000 mg | ORAL_TABLET | Freq: Two times a day (BID) | ORAL | Status: DC

## 2014-09-27 MED ORDER — FUROSEMIDE 10 MG/ML IJ SOLN
40.0000 mg | Freq: Once | INTRAMUSCULAR | Status: AC
  Administered 2014-09-27: 40 mg via INTRAVENOUS
  Filled 2014-09-27: qty 4

## 2014-09-27 MED ORDER — CETYLPYRIDINIUM CHLORIDE 0.05 % MT LIQD
7.0000 mL | Freq: Two times a day (BID) | OROMUCOSAL | Status: DC
  Administered 2014-09-28 – 2014-10-11 (×27): 7 mL via OROMUCOSAL

## 2014-09-27 MED ORDER — GUAIFENESIN ER 600 MG PO TB12
1200.0000 mg | ORAL_TABLET | Freq: Two times a day (BID) | ORAL | Status: DC
  Administered 2014-09-27 – 2014-10-11 (×28): 1200 mg via ORAL
  Filled 2014-09-27 (×30): qty 2

## 2014-09-27 MED ORDER — IPRATROPIUM-ALBUTEROL 0.5-2.5 (3) MG/3ML IN SOLN
3.0000 mL | Freq: Four times a day (QID) | RESPIRATORY_TRACT | Status: DC
  Administered 2014-09-27 – 2014-09-30 (×12): 3 mL via RESPIRATORY_TRACT
  Filled 2014-09-27 (×13): qty 3

## 2014-09-27 MED ORDER — CARVEDILOL 12.5 MG PO TABS
25.0000 mg | ORAL_TABLET | Freq: Two times a day (BID) | ORAL | Status: DC
  Administered 2014-09-27 – 2014-10-06 (×18): 25 mg via ORAL
  Filled 2014-09-27 (×18): qty 2

## 2014-09-27 MED ORDER — INSULIN ASPART 100 UNIT/ML ~~LOC~~ SOLN
2.0000 [IU] | Freq: Three times a day (TID) | SUBCUTANEOUS | Status: DC
  Administered 2014-09-27: 2 [IU] via SUBCUTANEOUS
  Administered 2014-09-27 (×2): 4 [IU] via SUBCUTANEOUS
  Administered 2014-09-28: 2 [IU] via SUBCUTANEOUS
  Administered 2014-09-28: 4 [IU] via SUBCUTANEOUS
  Administered 2014-09-28 – 2014-09-29 (×3): 2 [IU] via SUBCUTANEOUS
  Administered 2014-09-29 (×2): 4 [IU] via SUBCUTANEOUS
  Administered 2014-09-29 – 2014-09-30 (×2): 2 [IU] via SUBCUTANEOUS

## 2014-09-27 MED ORDER — DOXAZOSIN MESYLATE 2 MG PO TABS
2.0000 mg | ORAL_TABLET | Freq: Every day | ORAL | Status: DC
  Administered 2014-09-27 – 2014-10-15 (×18): 2 mg via ORAL
  Filled 2014-09-27 (×20): qty 1

## 2014-09-27 NOTE — Progress Notes (Signed)
Critical care md stated ok to give labetolol early d/t pt's b/p

## 2014-09-27 NOTE — Progress Notes (Signed)
Pt with b/p of 207/106. Called midlevel awaiting call back.

## 2014-09-27 NOTE — Progress Notes (Signed)
PULMONARY / CRITICAL CARE MEDICINE   Name: Philip [name] MRN: [id number] DOB: 11/30/1946  PCP [name],[name], MD    ADMISSION DATE:  09/24/2014 CONSULTATION DATE:  09/27/2014  REFERRING MD :  EDP  CHIEF COMPLAINT:  SOB  INITIAL PRESENTATION: [name] [facility] with SOB, cough, dizziness.  In ED, CXR suggestive of PNA.  He failed BiPAP and required intubation, PCCM consulted for admission.  STUDIES:  CXR 10/6 >> vascular congestion, b/l pleural effusions, RLL atx vs infiltrate. TTE 10/7 >> EF 60-65%, mod concentric hypertrophy, doppler parameters c/w gd I diastolic dysfxn   SIGNIFICANT EVENTS: 10/6  presented to ED, started on BiPAP but failed, required intubation. 10/7  ETT repositioned with poor volume return on vent, to 25 cm at teeth 10/8. Passed SBT, fully awake. Extubated.  10/9 hypertensive. Very congested cough, but no distress.   SUBJECTIVE:   C/o cough w/ difficulty expectorating sputum    VITAL SIGNS: Temp:  [97.7 F (36.5 C)-98.6 F (37 C)] 98.2 F (36.8 C) (10/09 0800) Pulse Rate:  [82-120] 102 (10/09 0800) Resp:  [20-33] 20 (10/09 0800) BP: (157-206)/(71-125) 182/91 mmHg (10/09 0800) SpO2:  [84 %-97 %] 93 % (10/09 0800) Weight:  [93.4 kg (205 lb 14.6 oz)] 93.4 kg (205 lb 14.6 oz) (10/09 0500) 4 liters     INTAKE / OUTPUT: Intake/Output     10/08 0701 - 10/09 0700 10/09 0701 - 10/10 0700   P.O. 240    I.V. (mL/kg) 115 (1.2)    NG/GT 40    IV Piggyback 50    Total Intake(mL/kg) 445 (4.8)    Urine (mL/kg/hr) 1925 (0.9)    Stool 200 (0.1)    Total Output 2125     Net -1680            PHYSICAL EXAMINATION: General: adult male in NAD eating breakfast in bed  Neuro: awake, completely oriented. No focal def  HEENT: [place]/AT. PERRL, sclerae anicteric. Cardiovascular: RRR, no M/R/G.  Lungs: Respirations even and unlabored, coarse scattered rhonchi w/ upper airway congestion  Abdomen: BS x hypoactive, soft, NT/ND, colostomy bag in  place. Musculoskeletal: No gross deformities, no edema.  Skin: Intact, warm, no rashes.  LABS:  CBC  Recent Labs Lab 09/24/14 2214 09/25/14 0226 09/27/14 0330  WBC 10.1 8.5 17.8*  HGB 13.8 12.6* 14.6  HCT 44.7 42.0 46.6  PLT 166 162 201   Coag's  Recent Labs Lab 09/25/14 0226  APTT 28  INR 1.23   BMET  Recent Labs Lab 09/25/14 0226 09/26/14 1028 09/27/14 0330  NA 139 144 143  K 4.9 3.9 4.7  CL 103 104 103  CO2 28 27 30   BUN 22 26* 27*  CREATININE 1.37* 1.06 1.13  GLUCOSE 162* 174* 137*   Electrolytes  Recent Labs Lab 09/25/14 0226 09/26/14 1028 09/27/14 0330  CALCIUM 9.0 9.3 9.2  MG 1.8  --   --   PHOS 2.9  --   --    Sepsis Markers  Recent Labs Lab 09/24/14 2226 09/25/14 0226 09/26/14 0330 09/27/14 0330  LATICACIDVEN 0.41* 1.4  --   --   PROCALCITON  --  <0.10 <0.10 <0.10   ABG  Recent Labs Lab 09/25/14 0107 09/25/14 0458 09/25/14 1136  PHART 7.168* 7.519* 7.566*  PCO2ART 75.3* 30.9* 27.0*  PO2ART 58.6* 100.0 102.0*   Liver Enzymes  Recent Labs Lab 09/24/14 2214 09/25/14 0226 09/27/14 0330  AST 23 20 20   ALT 14 12  14  ALKPHOS 63 60 62  BILITOT 0.3 0.3 0.3  ALBUMIN 3.8 3.3* 3.1*   Cardiac Enzymes  Recent Labs Lab 09/24/14 2214 09/25/14 0226 09/25/14 0700 09/25/14 1225  TROPONINI <0.30 <0.30 <0.30 <0.30  PROBNP 115.2 146.1*  --   --    Glucose  Recent Labs Lab 09/26/14 0807 09/26/14 1155 09/26/14 1557 09/26/14 1926 09/27/14 0020 09/27/14 0831  GLUCAP 176* 140* 190* 122* 121* 121*    Imaging Dg Chest Port 1 View  09/26/2014   CLINICAL DATA:  68 year old ventilated patient. Evaluate endotracheal tube position.  EXAM: PORTABLE CHEST - 1 VIEW  COMPARISON:  Chest x-ray 09/25/2014.  FINDINGS: An endotracheal tube is in place with tip 2.7 cm above the carina. A nasogastric tube is seen extending into the stomach, however, the tip of the nasogastric tube extends below the lower margin of the image. Lung volumes  are low. There are bibasilar opacities (left greater than right) favored to predominantly reflect subsegmental atelectasis at the right base, however, there is likely a combination of atelectasis and airspace consolidation in the left lower lobe, with small left pleural effusion. No evidence of pulmonary edema. Heart size appears borderline enlarged, likely accentuated by low lung volumes and patient rotation, which also distorts mediastinal contours.  IMPRESSION: 1. Support apparatus, as above. 2. Persistent atelectasis and consolidation in the left lower lobe with small left pleural effusion, and minimal subsegmental atelectasis in the right lung base.   Electronically Signed   By: Trudie Reedaniel  Entrikin M.D.   On: 09/26/2014 07:24   Dg Chest Port 1 View  09/25/2014   CLINICAL DATA:  Subsequent encounter for ventilator dependence and pneumonia.  EXAM: PORTABLE CHEST - 1 VIEW  COMPARISON:  Earlier the same day  FINDINGS: 1055 hrs. Endotracheal tube tip is 5.2 cm above the base of carina. The NG tube passes into the stomach although the distal tip position is not included on the film. Lung volumes remain low with left base collapse/ consolidation and small left pleural effusion. Telemetry leads overlie the chest. The cardiopericardial silhouette is within normal limits for size.  IMPRESSION: Endotracheal tube tip is 5.2 cm above the base of the carina.   Electronically Signed   By: Kennith CenterEric  Mansell M.D.   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 No distress but has persistent cough and marked HTN   P:   Wean FIO2 as able  DuoNebs / PRN Albuterol. Add flutter  Cont Solumedrol to 60mg  q 12   Lasix x 1  Smoking cessation counseling  Mobilize  Try to get outpatient PFT results Spiriva on 10/10   CARDIOVASCULAR A:  Hx HTN, HLD - BNP, lactate, cortisol, troponin wnl. P:   resume Cardura & pravachol. cont home norvasc, reduced dose coreg earlier, will go back to home dosing  Lasix x 1 Cont to hold Ace I for now   RENAL A:   No acute issues P:   NS @ KVO. BMP in AM.  GASTROINTESTINAL A:   GI prophylaxis Nutrition P:   Reg diet   HEMATOLOGIC A:   VTE Prophylaxis P:  SCD's / Lovenox. CBC in AM.  INFECTIOUS A:   CAP -  left consolidation Enterococcus UTI  P:   BCx2 10/7 >> UCx 10/7 >> enterococcus (amp sens) Sputum Cx 10/7 >>NPF  RVP 10/7 >> U Legionella 10/7 >> neg  U Strep 10/7 >> neg  Abx: Ceftriaxone, start date 10/6, day 3/x. Abx: Azithromycin, start date 10/6, day 3/x>>> completed 3 days  Procalcitonin algorithm to limit abx exposure. Will change to oral levaquin to cover enterococcus and CAP  Abx: levaquin start date 10/9. Day 0/7  ENDOCRINE A:   DM   P:   ssi  Hold outpatient metformin.  NEUROLOGIC A:   Acute metabolic encephalopathy-->resolved.  P:   rass goal 0    TODAY'S SUMMARY: [name] admitted with AECOPD and CAP.  No acute issues. Has coarse NP cough. Remains significantly hypertensive. Will try lasix X1. Resume more of his home HTN meds. Grew enterococcus in urine. Will change to levaquin for UTI AND CAP. Will keep him as SDU pt.    Family Meeting:  Brother updated at beside 10/8, called sisters Wanda Plump AM 10/9 and left messages  Anders Simmonds ACNP-BC [facility] Pulmonary/Critical Care Pager # [phone] OR # [phone] if no answer   Attending:  I have seen and examined the patient with nurse practitioner/resident and agree with the note above.   Add back BP meds and lasix Continue solumedrol, antibiotics SDU status, remains in ICU  Transfer to [facility] service, SDU We will see again tomorrow  (10/10)  Heber [place], MD [place] PCCM Pager: [phone] Cell: [phone] If no response, call [phone]    09/27/2014, 9:24 AM

## 2014-09-28 ENCOUNTER — Inpatient Hospital Stay (HOSPITAL_COMMUNITY): Payer: Medicare Other

## 2014-09-28 DIAGNOSIS — J438 Other emphysema: Secondary | ICD-10-CM

## 2014-09-28 LAB — COMPREHENSIVE METABOLIC PANEL
ALBUMIN: 3.2 g/dL — AB (ref 3.5–5.2)
ALK PHOS: 66 U/L (ref 39–117)
ALT: 40 U/L (ref 0–53)
AST: 32 U/L (ref 0–37)
Anion gap: 8 (ref 5–15)
BUN: 37 mg/dL — ABNORMAL HIGH (ref 6–23)
CO2: 35 mEq/L — ABNORMAL HIGH (ref 19–32)
Calcium: 9.4 mg/dL (ref 8.4–10.5)
Chloride: 101 mEq/L (ref 96–112)
Creatinine, Ser: 1.32 mg/dL (ref 0.50–1.35)
GFR calc Af Amer: 62 mL/min — ABNORMAL LOW (ref >90)
GFR calc non Af Amer: 53 mL/min — ABNORMAL LOW (ref >90)
Glucose, Bld: 141 mg/dL — ABNORMAL HIGH (ref 70–99)
POTASSIUM: 4.7 meq/L (ref 3.7–5.3)
SODIUM: 144 meq/L (ref 137–147)
TOTAL PROTEIN: 7.9 g/dL (ref 6.0–8.3)
Total Bilirubin: 0.3 mg/dL (ref 0.3–1.2)

## 2014-09-28 LAB — GLUCOSE, CAPILLARY
GLUCOSE-CAPILLARY: 144 mg/dL — AB (ref 70–99)
GLUCOSE-CAPILLARY: 166 mg/dL — AB (ref 70–99)
Glucose-Capillary: 124 mg/dL — ABNORMAL HIGH (ref 70–99)
Glucose-Capillary: 141 mg/dL — ABNORMAL HIGH (ref 70–99)

## 2014-09-28 LAB — CBC
HCT: 50.7 % (ref 39.0–52.0)
Hemoglobin: 15.9 g/dL (ref 13.0–17.0)
MCH: 29.7 pg (ref 26.0–34.0)
MCHC: 31.4 g/dL (ref 30.0–36.0)
MCV: 94.6 fL (ref 78.0–100.0)
Platelets: 198 10*3/uL (ref 150–400)
RBC: 5.36 MIL/uL (ref 4.22–5.81)
RDW: 13.3 % (ref 11.5–15.5)
WBC: 14.9 10*3/uL — ABNORMAL HIGH (ref 4.0–10.5)

## 2014-09-28 MED ORDER — HYDRALAZINE HCL 20 MG/ML IJ SOLN
10.0000 mg | INTRAMUSCULAR | Status: DC | PRN
  Administered 2014-09-28 – 2014-10-05 (×2): 10 mg via INTRAVENOUS
  Filled 2014-09-28 (×2): qty 1

## 2014-09-28 MED ORDER — AMLODIPINE BESYLATE 10 MG PO TABS
10.0000 mg | ORAL_TABLET | Freq: Every day | ORAL | Status: DC
  Administered 2014-09-28 – 2014-10-15 (×17): 10 mg via ORAL
  Filled 2014-09-28 (×18): qty 1

## 2014-09-28 MED ORDER — FUROSEMIDE 10 MG/ML IJ SOLN
40.0000 mg | Freq: Once | INTRAMUSCULAR | Status: AC
  Administered 2014-09-28: 40 mg via INTRAVENOUS
  Filled 2014-09-28: qty 4

## 2014-09-28 MED ORDER — BENAZEPRIL HCL 20 MG PO TABS
20.0000 mg | ORAL_TABLET | Freq: Every day | ORAL | Status: DC
  Administered 2014-09-28: 20 mg via ORAL
  Filled 2014-09-28 (×2): qty 1

## 2014-09-28 MED ORDER — CLONIDINE HCL 0.1 MG PO TABS
0.1000 mg | ORAL_TABLET | Freq: Three times a day (TID) | ORAL | Status: DC
  Administered 2014-09-28 – 2014-10-15 (×50): 0.1 mg via ORAL
  Filled 2014-09-28 (×53): qty 1

## 2014-09-28 MED ORDER — FUROSEMIDE 10 MG/ML IJ SOLN
40.0000 mg | Freq: Every day | INTRAMUSCULAR | Status: DC
  Administered 2014-09-28: 40 mg via INTRAVENOUS
  Filled 2014-09-28: qty 4

## 2014-09-28 NOTE — Progress Notes (Signed)
Called to bedside because of acute respiratory distress.  Patient moved in bed and had sudden respiratory distress but O2 sats were in the 90s on 6L.  He had tachypnea to 40s and accessory muscle use and was placed on NRB for comfort.  Very rhonchorous and saying his breathing is more labored currently.  Additional dose of lasix 40 mg IV once now.  Trial of bipap.  Will notify PCCM of turn of events.

## 2014-09-28 NOTE — Progress Notes (Signed)
PULMONARY / CRITICAL CARE MEDICINE   Name: Frederica Kusterrthur L Glendinning MRN: [id number] DOB: 11/01/1946  PCP [name],[name], MD    ADMISSION DATE:  09/24/2014 CONSULTATION DATE:  09/28/2014  REFERRING MD :  EDP  CHIEF COMPLAINT:  SOB  INITIAL PRESENTATION: [name] with [facility]B, cough, dizziness.  In ED, CXR suggestive of PNA.  He failed BiPAP and required intubation, PCCM consulted for admission.  STUDIES:  CXR 10/6 >> vascular congestion, b/l pleural effusions, RLL atx vs infiltrate. TTE 10/7 >> EF 60-65%, mod concentric hypertrophy, doppler parameters c/w gd I diastolic dysfxn   SIGNIFICANT EVENTS: 10/6  presented to ED, started on BiPAP but failed, required intubation. 10/7  ETT repositioned with poor volume return on vent, to 25 cm at teeth 10/8. Passed SBT, fully awake. Extubated.  10/9 hypertensive. Very congested cough, but no distress.   SUBJECTIVE:   C/o cough w/ difficulty expectorating sputum    VITAL SIGNS: Temp:  [97.5 F (36.4 C)-99.1 F (37.3 C)] 97.9 F (36.6 C) (10/10 0800) Pulse Rate:  [90-118] 101 (10/10 0846) Resp:  [20-47] 28 (10/10 0846) BP: (149-207)/(77-107) 179/100 mmHg (10/10 0846) SpO2:  [89 %-100 %] 95 % (10/10 0846) Weight:  [92.7 kg (204 lb 5.9 oz)] 92.7 kg (204 lb 5.9 oz) (10/10 0442) 4 liters    INTAKE / OUTPUT: Intake/Output     10/09 0701 - 10/10 0700 10/10 0701 - 10/11 0700   P.O. 720    I.V. (mL/kg) 240 (2.6) 40 (0.4)   NG/GT     IV Piggyback     Total Intake(mL/kg) 960 (10.4) 40 (0.4)   Urine (mL/kg/hr) 3200 (1.4) 175 (0.5)   Stool 200 (0.1)    Total Output 3400 175   Net -2440 -135          PHYSICAL EXAMINATION: General: adult male in NAD eating breakfast in bed  Neuro: awake, completely oriented. No focal def  HEENT: [place]/AT. PERRL, sclerae anicteric. Cardiovascular: RRR, no M/R/G.  Lungs: Respirations even and unlabored, coarse scattered rhonchi w/ upper airway congestion  Abdomen: BS x hypoactive, soft, NT/ND,  colostomy bag in place. Musculoskeletal: No gross deformities, no edema.  Skin: Intact, warm, no rashes.  LABS:  CBC  Recent Labs Lab 09/25/14 0226 09/27/14 0330 09/28/14 0336  WBC 8.5 17.8* 14.9*  HGB 12.6* 14.6 15.9  HCT 42.0 46.6 50.7  PLT 162 201 198   Coag's  Recent Labs Lab 09/25/14 0226  APTT 28  INR 1.23   BMET  Recent Labs Lab 09/26/14 1028 09/27/14 0330 09/28/14 0336  NA 144 143 144  K 3.9 4.7 4.7  CL 104 103 101  CO2 27 30 35*  BUN 26* 27* 37*  CREATININE 1.06 1.13 1.32  GLUCOSE 174* 137* 141*   Electrolytes  Recent Labs Lab 09/25/14 0226 09/26/14 1028 09/27/14 0330 09/28/14 0336  CALCIUM 9.0 9.3 9.2 9.4  MG 1.8  --   --   --   PHOS 2.9  --   --   --    Sepsis Markers  Recent Labs Lab 09/24/14 2226 09/25/14 0226 09/26/14 0330 09/27/14 0330  LATICACIDVEN 0.41* 1.4  --   --   PROCALCITON  --  <0.10 <0.10 <0.10   ABG  Recent Labs Lab 09/25/14 0107 09/25/14 0458 09/25/14 1136  PHART 7.168* 7.519* 7.566*  PCO2ART 75.3* 30.9* 27.0*  PO2ART 58.6* 100.0 102.0*   Liver Enzymes  Recent Labs Lab 09/25/14 0226 09/27/14 0330 09/28/14 0336  AST 20 20 32  ALT 12 14 40  ALKPHOS 60 62 66  BILITOT 0.3 0.3 0.3  ALBUMIN 3.3* 3.1* 3.2*   Cardiac Enzymes  Recent Labs Lab 09/24/14 2214 09/25/14 0226 09/25/14 0700 09/25/14 1225  TROPONINI <0.30 <0.30 <0.30 <0.30  PROBNP 115.2 146.1*  --   --    Glucose  Recent Labs Lab 09/27/14 0020 09/27/14 0831 09/27/14 1158 09/27/14 1633 09/27/14 2109 09/28/14 0749  GLUCAP 121* 121* 152* 141* 191* 144*    Imaging Dg Chest Port 1 View  09/28/2014   CLINICAL DATA:  Subsequent encounter for pneumonia.  EXAM: PORTABLE CHEST - 1 VIEW  COMPARISON:  09/26/2014.  FINDINGS: The heart is enlarged. The left pleural effusion and basilar airspace disease is similar to the prior study. The left upper lobe is better aerated. New right basilar airspace disease is present. A right pleural  effusion has slightly increased as well. The right upper lobe is clear. Mild pulmonary vascular congestion is present. The visualized soft tissues and bony thorax are unremarkable.  IMPRESSION: 1. Small moderate bilateral pleural effusions and associated airspace disease. This likely reflects atelectasis. Infection is also considered. Aeration is improving at the left base. 2. Cardiomegaly and mild pulmonary vascular congestion, slightly improved.   Electronically Signed   By: Gennette Pachris  Mattern M.D.   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 No distress but has persistent cough and marked HTN   P:   Wean FIO2 as able  DuoNebs / PRN Albuterol. Add flutter  Cont Solumedrol to 60mg  q 12 then taper over the next few days Lasix per primary Smoking cessation counseling  Mobilize  Try to get outpatient PFT results upon f/u with PCCM post discharge Spiriva started on 10/10   CARDIOVASCULAR A:  Hx HTN, HLD - BNP, lactate, cortisol, troponin wnl. P:  Resume Cardura & pravachol. Cont home norvasc, reduced dose coreg earlier, will go back to home dosing Cont to hold Ace I for now   RENAL A:   No acute issues P:   NS @ KVO. BMP in AM. Replace electrolytes as indicated  GASTROINTESTINAL A:   GI prophylaxis Nutrition P:   Reg diet   HEMATOLOGIC A:   VTE Prophylaxis P:  SCD's / Lovenox. CBC in AM.  INFECTIOUS A:   CAP - left consolidation Enterococcus UTI  P:   BCx2 10/7 >> UCx 10/7 >> enterococcus (amp sens) Sputum Cx 10/7 >>NPF  RVP 10/7 >> U Legionella 10/7 >> neg  U Strep 10/7 >> neg  Abx: Ceftriaxone, start date 10/6, day 3/3. Abx: Azithromycin, start  date 10/6, day 3/x>>> completed 3 days  Procalcitonin algorithm to limit abx exposure. Changed to oral levaquin to cover enterococcus and CAP  Abx: levaquin start date 10/9. Day 1/7  ENDOCRINE A:   DM   P:   SSI  Hold outpatient metformin.  NEUROLOGIC A:   Acute metabolic encephalopathy-->resolved.  P:   Moniiitor  TODAY'S SUMMARY: [name] admitted with AECOPD and CAP.  No acute issues. Has coarse NP cough. Transferred to SDU and TRH, PCCM will sign off, please call back if needed.   Alyson ReedyWesam G. Yacoub, M.D. [facility]  Care Medicine. Pager: [phone]. After hours pager: [phone].  09/28/2014, 11:00 AM

## 2014-09-28 NOTE — Progress Notes (Signed)
[facility] PROGRESS NOTE  TURHAN CHILL YQM:[id number] DOB: 09-10-1946 DOA: 09/24/2014 PCP: Jerlyn Ly, MD  INITIAL PRESENTATION: [name] brought to [facility] ED on 10/6 with SOB, cough, dizziness. In ED, CXR suggestive of PNA. He failed BiPAP and required intubation, PCCM consulted for admission.  STUDIES:  CXR 10/6 >> vascular congestion, b/l pleural effusions, RLL atx vs infiltrate.  TTE 10/7 >> EF 60-65%, mod concentric hypertrophy, doppler parameters c/w gd I diastolic dysfxn   SIGNIFICANT EVENTS:  10/6 presented to ED, started on BiPAP but failed, required intubation.  10/7 ETT repositioned with poor volume return on vent, to 25 cm at teeth  10/8. Passed SBT, fully awake. Extubated.  10/9 hypertensive. Very congested cough, but no distress.    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 failure in setting of CAP, AECOPD, and possible acute on chornic diastolic heart failure -  CXR:  Persistent small to moderate bilateral pleural effusions, mild vascular congestion and cardiomegaly -  Resume lasix 40mg  IV once daily -  Continue IS and flutter valve -  Continue solumedrol 60mg  IV q12h -  Continue duonebs/albuterol prn -  Continue abx  HTN/HLD, blood pressures very elevated still -  D/c labetalol (may worsen breathing in setting of COPD exacerbation) -  Start prn hydralazine -  Add clonidine scheduled TID -  Increase norvasc, doxazosin, and continue carvedilol (home med) -  Resume benazepril -  Continue statin  CAP - left consolidation appears improved on CXR -  Rhinovirus positive -  Continue levofloxacin day 5 of total antibiotics -  BCx2 10/7 NGTD -  Sputum Cx 10/7 >>NPF -  Legionella and S. pneumo neg  Enterococcus UTI  -  Day 2 of abx  DM, CBG well controlled -  Continue to hold meformin -  Continue SSI   Leukocytosis, likely secondary to steroids and pneumonia and trending donw  Diet:  diabetic Access:  PIV IVF:   off Proph:  lovenox  Code Status: full Family Communication: patient alone Disposition Plan: pending improvement in blood pressure over next few hours, possible transfer to telemetry  Consultants:  PCCM  Procedures: CXR 10/6 >> vascular congestion, b/l pleural effusions, RLL atx vs infiltrate.  TTE 10/7 >> EF 60-65%, mod concentric hypertrophy, doppler parameters c/w gd I diastolic dysfxn  Antibiotics: Ceftriaxone 10/6 >> 10/8 Azithromycin 10.6 >> 10/8  Levaquin 10/9 >>   HPI/Subjective:  Continuing to have a severe congested cough productive of some clear phlegm.  Still has some chest tightness.  Denies chest pain, nausea, vomiting.  Stools in ostomy bag.  Voiding easily.    Objective: Filed Vitals:   09/28/14 0400 09/28/14 0442 09/28/14 0500 09/28/14 0600  BP: 182/104  163/89 170/88  Pulse: 100  90 102  Temp: 97.7 F (36.5 C)  97.7 F (36.5 C) 97.9 F (36.6 C)  TempSrc:      Resp: 28  20 26   Height:      Weight:  92.7 kg (204 lb 5.9 oz)    SpO2: 100%  97% 90%    Intake/Output Summary (Last 24 hours) at 09/28/14 0741 Last data filed at 09/28/14 0600  Gross per 24 hour  Intake    960 ml  Output   3400 ml  Net  -2440 ml   Filed Weights   09/26/14 0355 09/27/14 0500 09/28/14 0442  Weight: 95.2 kg (209 lb 14.1 oz) 93.4 kg (205 lb 14.6 oz) 92.7 kg (204 lb 5.9 oz)    Exam:  General:  BM, no acute distress  HEENT:  NCAT, MMM  Cardiovascular:  RRR, nl S1, S2 no mrg, 2+ pulses, warm extremities  Respiratory:  Very rhonchorous breath sounds, no focal rales, somewhat diminished at bases, no obvious wheeze, no increased WOB  Abdomen:   NABS, soft, NT/ND  MSK:   Normal tone and bulk, trace bilateral LEE  Neuro:  Grossly intact  Data Reviewed: Basic Metabolic Panel:  Recent Labs Lab 09/24/14 2214 09/25/14 0226 09/26/14 1028 09/27/14 0330 09/28/14 0336  NA 144 139 144 143 144  K 4.8 4.9 3.9 4.7 4.7  CL 104 103 104 103 101  CO2 $Re'30 28 27 30 'Roq$ 35*   GLUCOSE 126* 162* 174* 137* 141*  BUN 23 22 26* 27* 37*  CREATININE 1.31 1.37* 1.06 1.13 1.32  CALCIUM 9.5 9.0 9.3 9.2 9.4  MG  --  1.8  --   --   --   PHOS  --  2.9  --   --   --    Liver Function Tests:  Recent Labs Lab 09/24/14 2214 09/25/14 0226 09/27/14 0330 09/28/14 0336  AST $Re'23 20 20 'Ypk$ 32  ALT $Re'14 12 14 'Kqg$ 40  ALKPHOS 63 60 62 66  BILITOT 0.3 0.3 0.3 0.3  PROT 8.4* 7.4 7.7 7.9  ALBUMIN 3.8 3.3* 3.1* 3.2*    Recent Labs Lab 09/25/14 0226  LIPASE 56  AMYLASE 114*   No results found for this basename: AMMONIA,  in the last 168 hours CBC:  Recent Labs Lab 09/24/14 2214 09/25/14 0226 09/27/14 0330 09/28/14 0336  WBC 10.1 8.5 17.8* 14.9*  NEUTROABS 7.2 6.7  --   --   HGB 13.8 12.6* 14.6 15.9  HCT 44.7 42.0 46.6 50.7  MCV 97.6 97.0 94.7 94.6  PLT 166 162 201 198   Cardiac Enzymes:  Recent Labs Lab 09/24/14 2214 09/25/14 0226 09/25/14 0700 09/25/14 1225  TROPONINI <0.30 <0.30 <0.30 <0.30   BNP (last 3 results)  Recent Labs  05/25/14 1905 09/24/14 2214 09/25/14 0226  PROBNP 110.1 115.2 146.1*   CBG:  Recent Labs Lab 09/27/14 0020 09/27/14 0831 09/27/14 1158 09/27/14 1633 09/27/14 2109  GLUCAP 121* 121* 152* 141* 191*    Recent Results (from the past 240 hour(s))  CULTURE, BLOOD (ROUTINE X 2)     Status: None   Collection Time    09/24/14 10:14 PM      Result Value Ref Range Status   Specimen Description BLOOD LEFT LAT WRIST   Final   Special Requests BOTTLES DRAWN AEROBIC AND ANAEROBIC 5CC   Final   Culture  Setup Time     Final   Value: 09/25/2014 01:54     Performed at [facility]   Culture     Final   Value:        BLOOD CULTURE RECEIVED NO GROWTH TO DATE CULTURE WILL BE HELD FOR 5 DAYS BEFORE ISSUING A FINAL NEGATIVE REPORT     Performed at [facility]   Report Status PENDING   Incomplete  CULTURE, BLOOD (ROUTINE X 2)     Status: None   Collection Time    09/24/14 10:19 PM      Result Value Ref Range Status    Specimen Description BLOOD RIGHT WRIST   Final   Special Requests BOTTLES DRAWN AEROBIC AND ANAEROBIC 4CC   Final   Culture  Setup Time     Final   Value: 09/25/2014 01:54     Performed at  [facility]     Final   Value:        BLOOD CULTURE RECEIVED NO GROWTH TO DATE CULTURE WILL BE HELD FOR 5 DAYS BEFORE ISSUING A FINAL NEGATIVE REPORT     Performed at [facility]   Report Status PENDING   Incomplete  URINE CULTURE     Status: None   Collection Time    09/25/14  1:07 AM      Result Value Ref Range Status   Specimen Description URINE, CATHETERIZED   Final   Special Requests Normal   Final   Culture  Setup Time     Final   Value: 09/25/2014 06:24     Performed at [place] [facility]     Final   Value: 15,000 COLONIES/ML     Performed at [facility]   Culture     Final   Value: ENTEROCOCCUS SPECIES     Performed at [facility]   Report Status 09/27/2014 FINAL   Final   Organism ID, Bacteria ENTEROCOCCUS SPECIES   Final  RESPIRATORY VIRUS PANEL     Status: Abnormal   Collection Time    09/25/14  3:30 AM      Result Value Ref Range Status   Source - RVPAN NOSE   Final   Respiratory Syncytial Virus A NOT DETECTED   Final   Respiratory Syncytial Virus B NOT DETECTED   Final   Influenza A NOT DETECTED   Final   Influenza B NOT DETECTED   Final   Parainfluenza 1 NOT DETECTED   Final   Parainfluenza 2 NOT DETECTED   Final   Parainfluenza 3 NOT DETECTED   Final   Metapneumovirus NOT DETECTED   Final   Rhinovirus DETECTED (*)  Final   Adenovirus NOT DETECTED   Final   Influenza A H1 NOT DETECTED   Final   Influenza A H3 NOT DETECTED   Final   Comment: (NOTE)           Normal Reference Range for each Analyte: NOT DETECTED     Testing performed using the Luminex xTAG Respiratory Viral Panel test     kit.     The analytical performance characteristics of this assay have been     determined by [facility].  The  modifications have not been     cleared or approved by the FDA. This assay has been validated pursuant     to the CLIA regulations and is used for clinical purposes.     Performed at [place] [facility] SCREENING     Status: None   Collection Time    09/25/14  3:32 AM      Result Value Ref Range Status   MRSA by PCR NEGATIVE  NEGATIVE Final   Comment:            The GeneXpert MRSA Assay (FDA     approved for NASAL specimens     only), is one component of a     comprehensive MRSA colonization     surveillance program. It is not     intended to diagnose MRSA     infection nor to guide or     monitor treatment for     MRSA infections.  CULTURE, RESPIRATORY (NON-EXPECTORATED)     Status: None   Collection Time    09/25/14  4:20 AM  Result Value Ref Range Status   Specimen Description TRACHEAL ASPIRATE   Final   Special Requests Normal   Final   Gram Stain     Final   Value: ABUNDANT WBC PRESENT,BOTH PMN AND MONONUCLEAR     RARE SQUAMOUS EPITHELIAL CELLS PRESENT     NO ORGANISMS SEEN     Performed at [facility]   Culture     Final   Value: Non-Pathogenic Oropharyngeal-type Flora Isolated.     Performed at [facility]   Report Status 09/27/2014 FINAL   Final     Studies: Dg Chest Port 1 View  09/28/2014   CLINICAL DATA:  Subsequent encounter for pneumonia.  EXAM: PORTABLE CHEST - 1 VIEW  COMPARISON:  09/26/2014.  FINDINGS: The heart is enlarged. The left pleural effusion and basilar airspace disease is similar to the prior study. The left upper lobe is better aerated. New right basilar airspace disease is present. A right pleural effusion has slightly increased as well. The right upper lobe is clear. Mild pulmonary vascular congestion is present. The visualized soft tissues and bony thorax are unremarkable.  IMPRESSION: 1. Small moderate bilateral pleural effusions and associated airspace disease. This likely reflects atelectasis. Infection is also  considered. Aeration is improving at the left base. 2. Cardiomegaly and mild pulmonary vascular congestion, slightly improved.   Electronically Signed   By: Lawrence Santiago M.D.   On: 09/28/2014 07:28    Scheduled Meds: . amLODipine  10 mg Oral Daily  . antiseptic oral rinse  7 mL Mouth Rinse BID  . carvedilol  25 mg Oral BID WC  . cloNIDine  0.1 mg Oral TID  . docusate sodium  100 mg Oral Daily  . doxazosin  2 mg Oral Daily  . enoxaparin (LOVENOX) injection  40 mg Subcutaneous Q24H  . furosemide  40 mg Intravenous Daily  . guaiFENesin  1,200 mg Oral BID  . Influenza vac split quadrivalent PF  0.5 mL Intramuscular Tomorrow-1000  . insulin aspart  2-6 Units Subcutaneous TID AC & HS  . ipratropium-albuterol  3 mL Nebulization QID  . levofloxacin  750 mg Oral Daily  . methylPREDNISolone (SOLU-MEDROL) injection  60 mg Intravenous Q12H  . pravastatin  20 mg Oral q1800   Continuous Infusions:   Active Problems:   Acute respiratory failure   CAP (community acquired pneumonia)    Time spent: 30 min    Jovanne Riggenbach, [place] [facility] Pager [phone]. If 7PM-7AM, please contact night-coverage at [email or web address], password [facility] 09/28/2014, 7:41 AM  LOS: 4 days

## 2014-09-29 ENCOUNTER — Inpatient Hospital Stay (HOSPITAL_COMMUNITY): Payer: Medicare Other

## 2014-09-29 LAB — CBC
HCT: 50.5 % (ref 39.0–52.0)
HEMOGLOBIN: 16.1 g/dL (ref 13.0–17.0)
MCH: 29.8 pg (ref 26.0–34.0)
MCHC: 31.9 g/dL (ref 30.0–36.0)
MCV: 93.5 fL (ref 78.0–100.0)
Platelets: 201 10*3/uL (ref 150–400)
RBC: 5.4 MIL/uL (ref 4.22–5.81)
RDW: 13.5 % (ref 11.5–15.5)
WBC: 15.3 10*3/uL — ABNORMAL HIGH (ref 4.0–10.5)

## 2014-09-29 LAB — BASIC METABOLIC PANEL
Anion gap: 11 (ref 5–15)
BUN: 52 mg/dL — AB (ref 6–23)
CHLORIDE: 94 meq/L — AB (ref 96–112)
CO2: 35 mEq/L — ABNORMAL HIGH (ref 19–32)
CREATININE: 1.55 mg/dL — AB (ref 0.50–1.35)
Calcium: 9.3 mg/dL (ref 8.4–10.5)
GFR calc non Af Amer: 44 mL/min — ABNORMAL LOW (ref >90)
GFR, EST AFRICAN AMERICAN: 51 mL/min — AB (ref >90)
GLUCOSE: 144 mg/dL — AB (ref 70–99)
Potassium: 4.1 mEq/L (ref 3.7–5.3)
Sodium: 140 mEq/L (ref 137–147)

## 2014-09-29 LAB — GLUCOSE, CAPILLARY
GLUCOSE-CAPILLARY: 145 mg/dL — AB (ref 70–99)
Glucose-Capillary: 145 mg/dL — ABNORMAL HIGH (ref 70–99)
Glucose-Capillary: 166 mg/dL — ABNORMAL HIGH (ref 70–99)

## 2014-09-29 NOTE — Progress Notes (Addendum)
[facility] PROGRESS NOTE  Philip [name] GQX:[id number] DOB: 04-Jan-1946 DOA: 09/24/2014 PCP: Ezequiel Kayser, MD  INITIAL PRESENTATION: [name] brought to [facility] ED on 10/6 with SOB, cough, dizziness. In ED, CXR suggestive of PNA. He failed BiPAP and required intubation, PCCM consulted for admission.  STUDIES:  CXR 10/6 >> vascular congestion, b/l pleural effusions, RLL atx vs infiltrate.  TTE 10/7 >> EF 60-65%, mod concentric hypertrophy, doppler parameters c/w gd I diastolic dysfxn   SIGNIFICANT EVENTS:  10/6 presented to ED, started on BiPAP but failed, required intubation.  10/7 ETT repositioned with poor volume return on vent, to 25 cm at teeth  10/8. Passed SBT, fully awake. Extubated.  10/9 hypertensive. 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  Diuresed well yesterday and now somewhat prerenal -  Repeat CXR -  D/c lasix -  Continue IS and flutter valve -  Continue solumedrol 60mg  IV q12h -  Continue duonebs/albuterol prn -  Continue abx as elow -  Resume bipap for now and consider taking off around lunch time -  Appreciate PCCM assistance  HTN/HLD, blood pressures improved -  Continue prn hydralazine -  Continue clonidine scheduled TID -  Continue norvasc, doxazosin, and carvedilol -  D/c benazepril 2/2 AKI -  Continue statin  CAP - left consolidation appears improved on CXR -  Rhinovirus positive -  Continue levofloxacin day 6 of total antibiotics -  BCx2 10/7 NGTD -  Sputum Cx 10/7 >>NPF -  Legionella and S. pneumo neg  Enterococcus UTI  -  Day 3 of abx  Mild AKI from diuresis yesterday -  D/c lasix and hold ACEI -  Keep slightly dry due to  respiratory issues  DM, CBG well controlled -  Continue to hold meformin -  Continue SSI   Leukocytosis, likely secondary to steroids and pneumonia and stable around 15K  Diet:  diabetic Access:  PIV IVF:  off Proph:  lovenox  Code Status: full Family Communication: patient alone Disposition Plan:  Still having intermittent respiratory distress requiring bipap.  Remain in stepdown.  PCCM following  Consultants:  PCCM  Procedures: CXR 10/6 >> vascular congestion, b/l pleural effusions, RLL atx vs infiltrate.  TTE 10/7 >> EF 60-65%, mod concentric hypertrophy, doppler parameters c/w gd I diastolic dysfxn  Antibiotics: Ceftriaxone 10/6 >> 10/8 Azithromycin 10.6 >> 10/8  Levaquin 10/9 >>   HPI/Subjective:  Cough productive of some clear phlegm but not much.  Still feels very congested and wheezy.  Would like to have bipap mask back because NRB feels like no air is going in.  Soft stools in ostomy bag.    Objective: Filed Vitals:   09/29/14 0353 09/29/14 0400 09/29/14 0500 09/29/14 0600  BP:  129/88 144/86 139/91  Pulse:  111 104 96  Temp:  98.4 F (36.9 C) 98.2 F (36.8 C) 98.1 F (36.7 C)  TempSrc:      Resp:  20 22 34  Height:      Weight: 92.1 kg (203 lb 0.7 oz)     SpO2:  92% 93% 93%    Intake/Output Summary (Last 24 hours) at 09/29/14 0738 Last data filed at 09/29/14 0600  Gross per 24 hour  Intake  360 ml  Output   3450 ml  Net  -3090 ml   Filed Weights   09/27/14 0500 09/28/14 0442 09/29/14 0353  Weight: 93.4 kg (205 lb 14.6 oz) 92.7 kg (204 lb 5.9 oz) 92.1 kg (203 lb 0.7 oz)    Exam:   General:  BM, moderate respiratory distress with tachypnea, accessory muscle use:  SCM, subcostal  HEENT:  NCAT, MMM  Cardiovascular:  RRR, nl S1, S2 no mrg, 2+ pulses, warm extremities  Respiratory:  Rhonchorous breath sounds, no focal rales, diminished more at the right base, slight stridor intermittently, no obvious wheeze  Abdomen:   NABS, soft,  NT/ND  MSK:   Normal tone and bulk, trace bilateral LEE  Neuro:  Grossly intact  Data Reviewed: Basic Metabolic Panel:  Recent Labs Lab 09/25/14 0226 09/26/14 1028 09/27/14 0330 09/28/14 0336 09/29/14 0357  NA 139 144 143 144 140  K 4.9 3.9 4.7 4.7 4.1  CL 103 104 103 101 94*  CO2 $Re'28 27 30 'yso$ 35* 35*  GLUCOSE 162* 174* 137* 141* 144*  BUN 22 26* 27* 37* 52*  CREATININE 1.37* 1.06 1.13 1.32 1.55*  CALCIUM 9.0 9.3 9.2 9.4 9.3  MG 1.8  --   --   --   --   PHOS 2.9  --   --   --   --    Liver Function Tests:  Recent Labs Lab 09/24/14 2214 09/25/14 0226 09/27/14 0330 09/28/14 0336  AST $Re'23 20 20 'nrp$ 32  ALT $Re'14 12 14 'rkf$ 40  ALKPHOS 63 60 62 66  BILITOT 0.3 0.3 0.3 0.3  PROT 8.4* 7.4 7.7 7.9  ALBUMIN 3.8 3.3* 3.1* 3.2*    Recent Labs Lab 09/25/14 0226  LIPASE 56  AMYLASE 114*   No results found for this basename: AMMONIA,  in the last 168 hours CBC:  Recent Labs Lab 09/24/14 2214 09/25/14 0226 09/27/14 0330 09/28/14 0336 09/29/14 0357  WBC 10.1 8.5 17.8* 14.9* 15.3*  NEUTROABS 7.2 6.7  --   --   --   HGB 13.8 12.6* 14.6 15.9 16.1  HCT 44.7 42.0 46.6 50.7 50.5  MCV 97.6 97.0 94.7 94.6 93.5  PLT 166 162 201 198 201   Cardiac Enzymes:  Recent Labs Lab 09/24/14 2214 09/25/14 0226 09/25/14 0700 09/25/14 1225  TROPONINI <0.30 <0.30 <0.30 <0.30   BNP (last 3 results)  Recent Labs  05/25/14 1905 09/24/14 2214 09/25/14 0226  PROBNP 110.1 115.2 146.1*   CBG:  Recent Labs Lab 09/27/14 2109 09/28/14 0749 09/28/14 1223 09/28/14 1639 09/28/14 2155  GLUCAP 191* 144* 166* 124* 141*    Recent Results (from the past 240 hour(s))  CULTURE, BLOOD (ROUTINE X 2)     Status: None   Collection Time    09/24/14 10:14 PM      Result Value Ref Range Status   Specimen Description BLOOD LEFT LAT WRIST   Final   Special Requests BOTTLES DRAWN AEROBIC AND ANAEROBIC 5CC   Final   Culture  Setup Time     Final   Value: 09/25/2014 01:54     Performed at [facility]   Culture     Final   Value:        BLOOD CULTURE RECEIVED NO GROWTH TO DATE CULTURE WILL BE HELD FOR 5 DAYS BEFORE ISSUING A FINAL NEGATIVE REPORT     Performed at [facility]   Report Status PENDING   Incomplete  CULTURE, BLOOD (ROUTINE X 2)  Status: None   Collection Time    09/24/14 10:19 PM      Result Value Ref Range Status   Specimen Description BLOOD RIGHT WRIST   Final   Special Requests BOTTLES DRAWN AEROBIC AND ANAEROBIC 4CC   Final   Culture  Setup Time     Final   Value: 09/25/2014 01:54     Performed at [facility]   Culture     Final   Value:        BLOOD CULTURE RECEIVED NO GROWTH TO DATE CULTURE WILL BE HELD FOR 5 DAYS BEFORE ISSUING A FINAL NEGATIVE REPORT     Performed at [facility]   Report Status PENDING   Incomplete  URINE CULTURE     Status: None   Collection Time    09/25/14  1:07 AM      Result Value Ref Range Status   Specimen Description URINE, CATHETERIZED   Final   Special Requests Normal   Final   Culture  Setup Time     Final   Value: 09/25/2014 06:24     Performed at [facility]     Final   Value: 15,000 COLONIES/ML     Performed at [facility]   Culture     Final   Value: ENTEROCOCCUS SPECIES     Performed at [facility]   Report Status 09/27/2014 FINAL   Final   Organism ID, Bacteria ENTEROCOCCUS SPECIES   Final  RESPIRATORY VIRUS PANEL     Status: Abnormal   Collection Time    09/25/14  3:30 AM      Result Value Ref Range Status   Source - RVPAN NOSE   Final   Respiratory Syncytial Virus A NOT DETECTED   Final   Respiratory Syncytial Virus B NOT DETECTED   Final   Influenza A NOT DETECTED   Final   Influenza B NOT DETECTED   Final   Parainfluenza 1 NOT DETECTED   Final   Parainfluenza 2 NOT DETECTED   Final   Parainfluenza 3 NOT DETECTED   Final   Metapneumovirus NOT DETECTED   Final   Rhinovirus DETECTED (*)  Final   Adenovirus NOT DETECTED   Final    Influenza A H1 NOT DETECTED   Final   Influenza A H3 NOT DETECTED   Final   Comment: (NOTE)           Normal Reference Range for each Analyte: NOT DETECTED     Testing performed using the Luminex xTAG Respiratory Viral Panel test     kit.     The analytical performance characteristics of this assay have been     determined by [facility].  The modifications have not been     cleared or approved by the FDA. This assay has been validated pursuant     to the CLIA regulations and is used for clinical purposes.     Performed at [place] PCR SCREENING     Status: None   Collection Time    09/25/14  3:32 AM      Result Value Ref Range Status   MRSA by PCR NEGATIVE  NEGATIVE Final   Comment:            The GeneXpert MRSA Assay (FDA     approved for NASAL specimens     only), is one component of a  comprehensive MRSA colonization     surveillance program. It is not     intended to diagnose MRSA     infection nor to guide or     monitor treatment for     MRSA infections.  CULTURE, RESPIRATORY (NON-EXPECTORATED)     Status: None   Collection Time    09/25/14  4:20 AM      Result Value Ref Range Status   Specimen Description TRACHEAL ASPIRATE   Final   Special Requests Normal   Final   Gram Stain     Final   Value: ABUNDANT WBC PRESENT,BOTH PMN AND MONONUCLEAR     RARE SQUAMOUS EPITHELIAL CELLS PRESENT     NO ORGANISMS SEEN     Performed at [facility]   Culture     Final   Value: Non-Pathogenic Oropharyngeal-type Flora Isolated.     Performed at [facility]   Report Status 09/27/2014 FINAL   Final     Studies: Dg Chest Port 1 View  09/28/2014   CLINICAL DATA:  Shortness of breath, no chest pain, cough, congestion  EXAM: PORTABLE CHEST - 1 VIEW  COMPARISON:  09/28/2014  FINDINGS: Cardiomegaly cardiomegaly. Again noted small bilateral pleural effusion with bilateral basilar atelectasis or infiltrate. No pulmonary edema. There is poor  inspiration.  IMPRESSION: Poor inspiration. Again noted small bilateral pleural effusion with bilateral basilar atelectasis or infiltrate. No pulmonary edema.   Electronically Signed   By: Lahoma Crocker M.D.   On: 09/28/2014 14:50   Dg Chest Port 1 View  09/28/2014   CLINICAL DATA:  Subsequent encounter for pneumonia.  EXAM: PORTABLE CHEST - 1 VIEW  COMPARISON:  09/26/2014.  FINDINGS: The heart is enlarged. The left pleural effusion and basilar airspace disease is similar to the prior study. The left upper lobe is better aerated. New right basilar airspace disease is present. A right pleural effusion has slightly increased as well. The right upper lobe is clear. Mild pulmonary vascular congestion is present. The visualized soft tissues and bony thorax are unremarkable.  IMPRESSION: 1. Small moderate bilateral pleural effusions and associated airspace disease. This likely reflects atelectasis. Infection is also considered. Aeration is improving at the left base. 2. Cardiomegaly and mild pulmonary vascular congestion, slightly improved.   Electronically Signed   By: Lawrence Santiago M.D.   On: 09/28/2014 07:28    Scheduled Meds: . amLODipine  10 mg Oral Daily  . antiseptic oral rinse  7 mL Mouth Rinse BID  . carvedilol  25 mg Oral BID WC  . cloNIDine  0.1 mg Oral TID  . docusate sodium  100 mg Oral Daily  . doxazosin  2 mg Oral Daily  . enoxaparin (LOVENOX) injection  40 mg Subcutaneous Q24H  . guaiFENesin  1,200 mg Oral BID  . Influenza vac split quadrivalent PF  0.5 mL Intramuscular Tomorrow-1000  . insulin aspart  2-6 Units Subcutaneous TID AC & HS  . ipratropium-albuterol  3 mL Nebulization QID  . levofloxacin  750 mg Oral Daily  . methylPREDNISolone (SOLU-MEDROL) injection  60 mg Intravenous Q12H  . pravastatin  20 mg Oral q1800   Continuous Infusions:   Active Problems:   Acute respiratory failure   CAP (community acquired pneumonia)    Time spent: 30 min    Anjuli Gemmill, [place]  [facility] Pager [phone]. If 7PM-7AM, please contact night-coverage at [email or web address], password [facility] 09/29/2014, 7:38 AM  LOS: 5 days

## 2014-09-29 NOTE — Progress Notes (Signed)
PULMONARY / CRITICAL CARE MEDICINE   Name: Philip [name] MRN: [id number] DOB: 06/18/1946  PCP [name],[name], MD    ADMISSION DATE:  09/24/2014 CONSULTATION DATE:  09/29/2014  REFERRING MD :  EDP  CHIEF COMPLAINT:  SOB  INITIAL PRESENTATION: [name] [facility] with SOB, cough, dizziness.  In ED, CXR suggestive of PNA.  He failed BiPAP and required intubation, PCCM consulted for admission.  STUDIES:  CXR 10/6 >> vascular congestion, b/l pleural effusions, RLL atx vs infiltrate. TTE 10/7 >> EF 60-65%, mod concentric hypertrophy, doppler parameters c/w gd I diastolic dysfxn   SIGNIFICANT EVENTS: 10/6  presented to ED, started on BiPAP but failed, required intubation. 10/7  ETT repositioned with poor volume return on vent, to 25 cm at teeth 10/8. Passed SBT, fully awake. Extubated.  10/9 hypertensive. Very congested cough, but no distress.   SUBJECTIVE:   Desaturation overnight, remains on BiPAP today.  VITAL SIGNS: Temp:  [98.1 F (36.7 C)-99 F (37.2 C)] 98.1 F (36.7 C) (10/11 0900) Pulse Rate:  [88-112] 88 (10/11 0900) Resp:  [20-35] 27 (10/11 0900) BP: (100-200)/(58-112) 158/97 mmHg (10/11 1101) SpO2:  [87 %-100 %] 94 % (10/11 0900) FiO2 (%):  [30 %-100 %] 40 % (10/11 0800) Weight:  [92.1 kg (203 lb 0.7 oz)] 92.1 kg (203 lb 0.7 oz) (10/11 0353) Vent Mode:  [-] PCV;BIPAP FiO2 (%):  [30 %-100 %] 40 % Set Rate:  [20 bmp] 20 bmp PEEP:  [5 cmH20] 5 cmH20 INTAKE / OUTPUT: Intake/Output     10/10 0701 - 10/11 0700 10/11 0701 - 10/12 0700   P.O. 100    I.V. (mL/kg) 270 (2.9) 20 (0.2)   Total Intake(mL/kg) 370 (4) 20 (0.2)   Urine (mL/kg/hr) 3050 (1.4) 200 (0.5)   Stool 400 (0.2)    Total Output 3450 200   Net -3080 -180          PHYSICAL EXAMINATION: General: adult male in NAD eating breakfast in bed  Neuro: awake, completely oriented. No focal def  HEENT: [place]/AT. PERRL, sclerae anicteric. Cardiovascular: RRR, no M/R/G.  Lungs: Respirations even and  unlabored, coarse scattered rhonchi w/ upper airway congestion  Abdomen: BS x hypoactive, soft, NT/ND, colostomy bag in place. Musculoskeletal: No gross deformities, no edema.  Skin: Intact, warm, no rashes.  LABS:  CBC  Recent Labs Lab 09/27/14 0330 09/28/14 0336 09/29/14 0357  WBC 17.8* 14.9* 15.3*  HGB 14.6 15.9 16.1  HCT 46.6 50.7 50.5  PLT 201 198 201   Coag's  Recent Labs Lab 09/25/14 0226  APTT 28  INR 1.23   BMET  Recent Labs Lab 09/27/14 0330 09/28/14 0336 09/29/14 0357  NA 143 144 140  K 4.7 4.7 4.1  CL 103 101 94*  CO2 30 35* 35*  BUN 27* 37* 52*  CREATININE 1.13 1.32 1.55*  GLUCOSE 137* 141* 144*   Electrolytes  Recent Labs Lab 09/25/14 0226  09/27/14 0330 09/28/14 0336 09/29/14 0357  CALCIUM 9.0  < > 9.2 9.4 9.3  MG 1.8  --   --   --   --   PHOS 2.9  --   --   --   --   < > = values in this interval not displayed. Sepsis Markers  Recent Labs Lab 09/24/14 2226 09/25/14 0226 09/26/14 0330 09/27/14 0330  LATICACIDVEN 0.41* 1.4  --   --   PROCALCITON  --  <0.10 <0.10 <0.10   ABG  Recent Labs  Lab 09/25/14 0107 09/25/14 0458 09/25/14 1136  PHART 7.168* 7.519* 7.566*  PCO2ART 75.3* 30.9* 27.0*  PO2ART 58.6* 100.0 102.0*   Liver Enzymes  Recent Labs Lab 09/25/14 0226 09/27/14 0330 09/28/14 0336  AST 20 20 32  ALT 12 14 40  ALKPHOS 60 62 66  BILITOT 0.3 0.3 0.3  ALBUMIN 3.3* 3.1* 3.2*   Cardiac Enzymes  Recent Labs Lab 09/24/14 2214 09/25/14 0226 09/25/14 0700 09/25/14 1225  TROPONINI <0.30 <0.30 <0.30 <0.30  PROBNP 115.2 146.1*  --   --    Glucose  Recent Labs Lab 09/27/14 2109 09/28/14 0749 09/28/14 1223 09/28/14 1639 09/28/14 2155 09/29/14 0812  GLUCAP 191* 144* 166* 124* 141* 145*    Imaging Dg Chest Port 1 View  09/28/2014   CLINICAL DATA:  Shortness of breath, no chest pain, cough, congestion  EXAM: PORTABLE CHEST - 1 VIEW  COMPARISON:  09/28/2014  FINDINGS: Cardiomegaly cardiomegaly. Again  noted small bilateral pleural effusion with bilateral basilar atelectasis or infiltrate. No pulmonary edema. There is poor inspiration.  IMPRESSION: Poor inspiration. Again noted small bilateral pleural effusion with bilateral basilar atelectasis or infiltrate. No pulmonary edema.   Electronically Signed   By: Natasha MeadLiviu  Pop M.D.   On: 09/28/2014 14:50   Dg Chest Port 1 View  09/28/2014   CLINICAL DATA:  Subsequent encounter for pneumonia.  EXAM: PORTABLE CHEST - 1 VIEW  COMPARISON:  09/26/2014.  FINDINGS: The heart is enlarged. The left pleural effusion and basilar airspace disease is similar to the prior study. The left upper lobe is better aerated. New right basilar airspace disease is present. A right pleural effusion has slightly increased as well. The right upper lobe is clear. Mild pulmonary vascular congestion is present. The visualized soft tissues and bony thorax are unremarkable.  IMPRESSION: 1. Small moderate bilateral pleural effusions and associated airspace disease. This likely reflects atelectasis. Infection is also considered. Aeration is improving at the left base. 2. Cardiomegaly and mild pulmonary vascular congestion, slightly improved.   Electronically Signed   By: Gennette Pachris  Mattern M.D.   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 No distress but has persistent cough and marked HTN   P:   BiPAP as needed. DuoNebs / PRN Albuterol. Add flutter. Cont Solumedrol to 60mg  q 12 hr. Hold further diureses for now. Smoking cessation counseling. Spiriva started on 10/10.  CARDIOVASCULAR A:  Hx HTN, HLD - BNP, lactate, cortisol, troponin wnl. P:  Resume Cardura &  pravachol. Cont home norvasc, reduced dose coreg earlier, will go back to home dosing. Cont to hold Ace-I for now.  RENAL A:   No acute issues P:   NS @ KVO. BMP in AM. Replace electrolytes as indicated. Hold further diureses.  GASTROINTESTINAL A:   GI prophylaxis Nutrition P:   NPO while on BiPAP for high risk intubation.  HEMATOLOGIC A:   VTE Prophylaxis P:  SCD's / Lovenox. CBC in AM.  INFECTIOUS A:   CAP - left consolidation Enterococcus UTI  P:   BCx2 10/7 >> UCx 10/7 >> enterococcus (amp sens) Sputum Cx 10/7 >>NPF  RVP 10/7 >> U Legionella 10/7 >> neg  U Strep 10/7 >> neg  Abx: Ceftriaxone, start date 10/6, day 3/3. Abx: Azithromycin, start date 10/6, day 3/x>>> completed 3 days  Procalcitonin algorithm to limit abx exposure. Changed to oral levaquin to cover enterococcus and CAP  Abx: levaquin start date 10/9. Day 2/7  ENDOCRINE A:   DM   P:   SSI  Hold outpatient metformin.  NEUROLOGIC A:   Acute metabolic encephalopathy-->resolved.  P:   Monitor  TODAY'S SUMMARY:  Respiratory failure overnight, required BiPAP, diuresed well overnight but Cr is now increasing, would hold further diureses, hold in the ICU for monitoring and taper BiPAP down as able.  CC time 35 min.  Alyson Reedy, M.D. [facility] Pulmonary/Critical Care Medicine. Pager: [phone]. After hours pager: [phone].  09/29/2014, 11:08 AM

## 2014-09-29 NOTE — Progress Notes (Signed)
Pt on and requiring NIV BiPAP at this time.  Plan is to place Pt on venturi mask and allow the Pt to rest and take a break as the MDs are requesting, then go back on NIV BiPAP when needed.

## 2014-09-30 ENCOUNTER — Inpatient Hospital Stay (HOSPITAL_COMMUNITY): Payer: Medicare Other

## 2014-09-30 LAB — BASIC METABOLIC PANEL
Anion gap: 8 (ref 5–15)
BUN: 61 mg/dL — ABNORMAL HIGH (ref 6–23)
CALCIUM: 9.3 mg/dL (ref 8.4–10.5)
CO2: 36 meq/L — AB (ref 19–32)
CREATININE: 1.53 mg/dL — AB (ref 0.50–1.35)
Chloride: 100 mEq/L (ref 96–112)
GFR calc Af Amer: 52 mL/min — ABNORMAL LOW (ref >90)
GFR calc non Af Amer: 45 mL/min — ABNORMAL LOW (ref >90)
GLUCOSE: 142 mg/dL — AB (ref 70–99)
Potassium: 4.3 mEq/L (ref 3.7–5.3)
Sodium: 144 mEq/L (ref 137–147)

## 2014-09-30 LAB — CBC
HEMATOCRIT: 49.6 % (ref 39.0–52.0)
Hemoglobin: 15.9 g/dL (ref 13.0–17.0)
MCH: 29.7 pg (ref 26.0–34.0)
MCHC: 32.1 g/dL (ref 30.0–36.0)
MCV: 92.7 fL (ref 78.0–100.0)
Platelets: 179 10*3/uL (ref 150–400)
RBC: 5.35 MIL/uL (ref 4.22–5.81)
RDW: 13.5 % (ref 11.5–15.5)
WBC: 14.3 10*3/uL — ABNORMAL HIGH (ref 4.0–10.5)

## 2014-09-30 LAB — GLUCOSE, CAPILLARY
GLUCOSE-CAPILLARY: 137 mg/dL — AB (ref 70–99)
GLUCOSE-CAPILLARY: 151 mg/dL — AB (ref 70–99)
Glucose-Capillary: 126 mg/dL — ABNORMAL HIGH (ref 70–99)

## 2014-09-30 LAB — MAGNESIUM: Magnesium: 2.7 mg/dL — ABNORMAL HIGH (ref 1.5–2.5)

## 2014-09-30 LAB — PHOSPHORUS: Phosphorus: 4.7 mg/dL — ABNORMAL HIGH (ref 2.3–4.6)

## 2014-09-30 MED ORDER — PANTOPRAZOLE SODIUM 40 MG PO TBEC
40.0000 mg | DELAYED_RELEASE_TABLET | Freq: Every day | ORAL | Status: DC
  Administered 2014-09-30 – 2014-10-11 (×10): 40 mg via ORAL
  Filled 2014-09-30 (×10): qty 1

## 2014-09-30 MED ORDER — BUDESONIDE 0.5 MG/2ML IN SUSP
0.5000 mg | Freq: Two times a day (BID) | RESPIRATORY_TRACT | Status: DC
  Administered 2014-09-30 – 2014-10-03 (×6): 0.5 mg via RESPIRATORY_TRACT
  Filled 2014-09-30 (×7): qty 2

## 2014-09-30 MED ORDER — ARFORMOTEROL TARTRATE 15 MCG/2ML IN NEBU
15.0000 ug | INHALATION_SOLUTION | Freq: Two times a day (BID) | RESPIRATORY_TRACT | Status: DC
  Administered 2014-09-30 – 2014-10-03 (×6): 15 ug via RESPIRATORY_TRACT
  Filled 2014-09-30 (×7): qty 2

## 2014-09-30 MED ORDER — FLUTICASONE PROPIONATE 50 MCG/ACT NA SUSP
2.0000 | Freq: Two times a day (BID) | NASAL | Status: DC
  Administered 2014-09-30 – 2014-10-16 (×26): 2 via NASAL
  Filled 2014-09-30: qty 16

## 2014-09-30 NOTE — Progress Notes (Signed)
[facility] PROGRESS NOTE  Philip [name] HWK:[id number] DOB: [birth year] DOA: 09/24/2014 PCP: Jerlyn Ly, MD  INITIAL PRESENTATION: [name] brought to [facility] ED on 10/6 with SOB, cough, dizziness. In ED, CXR suggestive of PNA. He failed BiPAP and required intubation, PCCM consulted for admission.  STUDIES:  CXR 10/6 >> vascular congestion, b/l pleural effusions, RLL atx vs infiltrate.  TTE 10/7 >> EF 60-65%, mod concentric hypertrophy, doppler parameters c/w gd I diastolic dysfxn   SIGNIFICANT EVENTS:  10/6 presented to ED, started on BiPAP but failed, required intubation.  10/7 ETT repositioned with poor volume return on vent, to 25 cm at teeth  10/8. Passed SBT, fully awake. Extubated.  10/9 hypertensive. Very congested cough, but no distress 10/10 recurrent respiratory distress requiring bipap during the day and overnight 10/11 still tachypneic with increased WOB on NRB -> bipap replaced  10/12 mostly bipap dependent.  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  Diuresed and now somewhat prerenal although X-ray being read as pulmonary edema this AM.  Not thriving or improving.  He has been on tx for atypical pneumonia, but could this be PCP?  Perhaps he has some mucous plugging?   -  Repeat CXR:  Developing pulmonary edema (despite being dry on labs and exam) -  Continue IS and flutter valve -  Continue solumedrol 48m IV q12h -  Continue duonebs/albuterol prn -  Continue abx as below -  Repeat sputum culture -  Appreciate PCCM assistance  HTN/HLD, blood pressures improved -  Continue prn hydralazine -  Continue clonidine, norvasc, doxazosin, and carvedilol -  D/c benazepril 2/2 AKI -  Continue statin  CAP - left consolidation appears improved on CXR -  Rhinovirus positive  -  Continue levofloxacin day 7 of total antibiotics -  BCx2 10/7  NGTD -  Sputum Cx 10/7 >>NPF -  Legionella and S. pneumo neg  Enterococcus UTI  -  Day 4 of abx  Mild AKI from diuresis, keep slightly dry due to respiratory issues  DM, CBG well controlled -  Continue to hold meformin -  Continue SSI   Leukocytosis, likely secondary to steroids and pneumonia and stable around 15K  Diet:  diabetic Access:  PIV IVF:  off Proph:  lovenox  Code Status: full Family Communication: patient alone Disposition Plan:  Still having intermittent respiratory distress requiring bipap.  Remain in stepdown.  PCCM following  Consultants:  PCCM  Procedures: CXR 10/6 >> vascular congestion, b/l pleural effusions, RLL atx vs infiltrate.  TTE 10/7 >> EF 60-65%, mod concentric hypertrophy, doppler parameters c/w gd I diastolic dysfxn  Antibiotics: Ceftriaxone 10/6 >> 10/8 Azithromycin 10.6 >> 10/8  Levaquin 10/9 >>   HPI/Subjective:  Bad night.  On bipap most of the day yesterday and just taken off to venti mask this AM.  Can barely speak because of cough and secretions.    Objective: Filed Vitals:   09/30/14 0400 09/30/14 0500 09/30/14 0600 09/30/14 0725  BP: 107/66 120/81 136/87   Pulse: 80 79 81   Temp: 96.9 F (36.1 C)     TempSrc: Oral     Resp: _0 Height:      Weight: 88.7 kg (195 lb 8.8 oz)     SpO2: 98% 97% 97% 98%    Intake/Output Summary (Last 24 hours) at 09/30/14 0742 Last data filed at 09/30/14  0600  Gross per 24 hour  Intake    350 ml  Output    963 ml  Net   -613 ml   Filed Weights   09/28/14 0442 09/29/14 0353 09/30/14 0400  Weight: 92.7 kg (204 lb 5.9 oz) 92.1 kg (203 lb 0.7 oz) 88.7 kg (195 lb 8.8 oz)    Exam:   General:  BM, speaking in 1 word, shallow inspirations, accessory muscle use  HEENT:  NCAT, MMM  Cardiovascular:  RRR, nl S1, S2 no mrg, 2+ pulses, warm extremities  Respiratory:  Rhonchorous breath sounds, suctioning very thick secretions, no focal rales, diminished more at the right base, no  obvious wheeze  Abdomen:   NABS, soft, NT/ND  MSK:   Normal tone and bulk, no LEE  Neuro:  Grossly intact  Data Reviewed: Basic Metabolic Panel:  Recent Labs Lab 09/25/14 0226 09/26/14 1028 09/27/14 0330 09/28/14 0336 09/29/14 0357 09/30/14 0336  NA 139 144 143 144 140 144  K 4.9 3.9 4.7 4.7 4.1 4.3  CL 103 104 103 101 94* 100  CO2 _0 35* 35* 36*  GLUCOSE 162* 174* 137* 141* 144* 142*  BUN 22 26* 27* 37* 52* 61*  CREATININE 1.37* 1.06 1.13 1.32 1.55* 1.53*  CALCIUM 9.0 9.3 9.2 9.4 9.3 9.3  MG 1.8  --   --   --   --  2.7*  PHOS 2.9  --   --   --   --  4.7*   Liver Function Tests:  Recent Labs Lab 09/24/14 2214 09/25/14 0226 09/27/14 0330 09/28/14 0336  AST _1 32  ALT _2 40  ALKPHOS 63 60 62 66  BILITOT 0.3 0.3 0.3 0.3  PROT 8.4* 7.4 7.7 7.9  ALBUMIN 3.8 3.3* 3.1* 3.2*    Recent Labs Lab 09/25/14 0226  LIPASE 56  AMYLASE 114*   No results found for this basename: AMMONIA,  in the last 168 hours CBC:  Recent Labs Lab 09/24/14 2214 09/25/14 0226 09/27/14 0330 09/28/14 0336 09/29/14 0357 09/30/14 0336  WBC 10.1 8.5 17.8* 14.9* 15.3* 14.3*  NEUTROABS 7.2 6.7  --   --   --   --   HGB 13.8 12.6* 14.6 15.9 16.1 15.9  HCT 44.7 42.0 46.6 50.7 50.5 49.6  MCV 97.6 97.0 94.7 94.6 93.5 92.7  PLT 166 162 201 198 201 179   Cardiac Enzymes:  Recent Labs Lab 09/24/14 2214 09/25/14 0226 09/25/14 0700 09/25/14 1225  TROPONINI <0.30 <0.30 <0.30 <0.30   BNP (last 3 results)  Recent Labs  05/25/14 1905 09/24/14 2214 09/25/14 0226  PROBNP 110.1 115.2 146.1*   CBG:  Recent Labs Lab 09/28/14 2155 09/29/14 0812 09/29/14 1154 09/29/14 1610 09/29/14 2356  GLUCAP 141* 145* 145* 166* 151*    Recent Results (from the past 240 hour(s))  CULTURE, BLOOD (ROUTINE X 2)     Status: None   Collection Time    09/24/14 10:14 PM      Result Value Ref Range Status   Specimen Description BLOOD LEFT LAT WRIST   Final   Special Requests  BOTTLES DRAWN AEROBIC AND ANAEROBIC 5CC   Final   Culture  Setup Time     Final   Value: 09/25/2014 01:54     Performed at [facility]     Final   Value:        BLOOD CULTURE RECEIVED NO GROWTH TO DATE CULTURE WILL BE HELD  FOR 5 DAYS BEFORE ISSUING A FINAL NEGATIVE REPORT     Performed at [facility]   Report Status PENDING   Incomplete  CULTURE, BLOOD (ROUTINE X 2)     Status: None   Collection Time    09/24/14 10:19 PM      Result Value Ref Range Status   Specimen Description BLOOD RIGHT WRIST   Final   Special Requests BOTTLES DRAWN AEROBIC AND ANAEROBIC 4CC   Final   Culture  Setup Time     Final   Value: 09/25/2014 01:54     Performed at [facility]   Culture     Final   Value:        BLOOD CULTURE RECEIVED NO GROWTH TO DATE CULTURE WILL BE HELD FOR 5 DAYS BEFORE ISSUING A FINAL NEGATIVE REPORT     Performed at [facility]   Report Status PENDING   Incomplete  URINE CULTURE     Status: None   Collection Time    09/25/14  1:07 AM      Result Value Ref Range Status   Specimen Description URINE, CATHETERIZED   Final   Special Requests Normal   Final   Culture  Setup Time     Final   Value: 09/25/2014 06:24     Performed at [place] [facility]     Final   Value: 15,000 COLONIES/ML     Performed at [facility]   Culture     Final   Value: ENTEROCOCCUS SPECIES     Performed at [facility]   Report Status 09/27/2014 FINAL   Final   Organism ID, Bacteria ENTEROCOCCUS SPECIES   Final  RESPIRATORY VIRUS PANEL     Status: Abnormal   Collection Time    09/25/14  3:30 AM      Result Value Ref Range Status   Source - RVPAN NOSE   Final   Respiratory Syncytial Virus A NOT DETECTED   Final   Respiratory Syncytial Virus B NOT DETECTED   Final   Influenza A NOT DETECTED   Final   Influenza B NOT DETECTED   Final   Parainfluenza 1 NOT DETECTED   Final   Parainfluenza 2 NOT DETECTED   Final    Parainfluenza 3 NOT DETECTED   Final   Metapneumovirus NOT DETECTED   Final   Rhinovirus DETECTED (*)  Final   Adenovirus NOT DETECTED   Final   Influenza A H1 NOT DETECTED   Final   Influenza A H3 NOT DETECTED   Final   Comment: (NOTE)           Normal Reference Range for each Analyte: NOT DETECTED     Testing performed using the Luminex xTAG Respiratory Viral Panel test     kit.     The analytical performance characteristics of this assay have been     determined by [facility].  The modifications have not been     cleared or approved by the FDA. This assay has been validated pursuant     to the CLIA regulations and is used for clinical purposes.     Performed at [facility] PCR SCREENING     Status: None   Collection Time    09/25/14  3:32 AM      Result Value Ref Range Status   MRSA by PCR NEGATIVE  NEGATIVE Final   Comment:  The GeneXpert MRSA Assay (FDA     approved for NASAL specimens     only), is one component of a     comprehensive MRSA colonization     surveillance program. It is not     intended to diagnose MRSA     infection nor to guide or     monitor treatment for     MRSA infections.  CULTURE, RESPIRATORY (NON-EXPECTORATED)     Status: None   Collection Time    09/25/14  4:20 AM      Result Value Ref Range Status   Specimen Description TRACHEAL ASPIRATE   Final   Special Requests Normal   Final   Gram Stain     Final   Value: ABUNDANT WBC PRESENT,BOTH PMN AND MONONUCLEAR     RARE SQUAMOUS EPITHELIAL CELLS PRESENT     NO ORGANISMS SEEN     Performed at [facility]   Culture     Final   Value: Non-Pathogenic Oropharyngeal-type Flora Isolated.     Performed at [facility]   Report Status 09/27/2014 FINAL   Final     Studies: Dg Chest Port 1 View  09/30/2014   CLINICAL DATA:  Respiratory failure.  EXAM: PORTABLE CHEST - 1 VIEW  COMPARISON:  09/29/2014  FINDINGS: Low lung volumes accentuate  cardiomediastinal silhouette which is probably increased. Developing pulmonary edema. Bibasilar atelectasis with effusions. No pneumothorax. No support apparatus.  IMPRESSION: Worsening aeration.  Developing pulmonary edema.   Electronically Signed   By: Rolla Flatten M.D.   On: 09/30/2014 07:14   Dg Chest Port 1 View  09/29/2014   CLINICAL DATA:  Shortness of breath, tobacco user, history of prostate cancer  EXAM: PORTABLE CHEST - 1 VIEW  COMPARISON:  09/28/2014  FINDINGS: Cardiomediastinal silhouette is stable. There is poor inspiration. Persistent small bilateral pleural effusion with bilateral basilar atelectasis or infiltrate. No pulmonary edema.  IMPRESSION: Poor inspiration. Persistent small pleural effusion with bilateral basilar atelectasis or infiltrate. No pulmonary edema.   Electronically Signed   By: Lahoma Crocker M.D.   On: 09/29/2014 11:13   Dg Chest Port 1 View  09/28/2014   CLINICAL DATA:  Shortness of breath, no chest pain, cough, congestion  EXAM: PORTABLE CHEST - 1 VIEW  COMPARISON:  09/28/2014  FINDINGS: Cardiomegaly cardiomegaly. Again noted small bilateral pleural effusion with bilateral basilar atelectasis or infiltrate. No pulmonary edema. There is poor inspiration.  IMPRESSION: Poor inspiration. Again noted small bilateral pleural effusion with bilateral basilar atelectasis or infiltrate. No pulmonary edema.   Electronically Signed   By: Lahoma Crocker M.D.   On: 09/28/2014 14:50    Scheduled Meds: . amLODipine  10 mg Oral Daily  . antiseptic oral rinse  7 mL Mouth Rinse BID  . carvedilol  25 mg Oral BID WC  . cloNIDine  0.1 mg Oral TID  . docusate sodium  100 mg Oral Daily  . doxazosin  2 mg Oral Daily  . enoxaparin (LOVENOX) injection  40 mg Subcutaneous Q24H  . guaiFENesin  1,200 mg Oral BID  . Influenza vac split quadrivalent PF  0.5 mL Intramuscular Tomorrow-1000  . insulin aspart  2-6 Units Subcutaneous TID AC & HS  . ipratropium-albuterol  3 mL Nebulization QID  .  levofloxacin  750 mg Oral Daily  . methylPREDNISolone (SOLU-MEDROL) injection  60 mg Intravenous Q12H  . pravastatin  20 mg Oral q1800   Continuous Infusions:   Active Problems:   Acute respiratory  failure   CAP (community acquired pneumonia)    Time spent: 30 min    Gennie Eisinger, [facility] Pager [phone]. If 7PM-7AM, please contact night-coverage at [email or web address], password [facility] 09/30/2014, 7:42 AM  LOS: 6 days

## 2014-09-30 NOTE — Progress Notes (Signed)
Pt is off Bipap and is wearing 6L nasal cannula. Pt states he feels comfortable and shows no signs of increase WOB. RT will continue to monitor.

## 2014-09-30 NOTE — Progress Notes (Signed)
Pt taken off Bipap this am. Placed on 45% VentiMask after breathing treatment. Tolerating well. Productive cough with use of flutter valve.

## 2014-09-30 NOTE — Progress Notes (Signed)
Pt placed back to Bipap for inc WOB, inc RR and pt states he is not breathing well. RN aware. RT will continue to follow.

## 2014-09-30 NOTE — Progress Notes (Signed)
Pt is off Bipap at this time and on NRB (partial) will continue to follow. Pt using flutter valve.

## 2014-09-30 NOTE — Progress Notes (Signed)
PULMONARY / CRITICAL CARE MEDICINE   Name: Philip [name] MRN: [id number] DOB: 09/21/1946  PCP [name],[name], MD    ADMISSION DATE:  09/24/2014 CONSULTATION DATE:  09/30/2014  REFERRING MD :  EDP  CHIEF COMPLAINT:  SOB  INITIAL PRESENTATION: [name] with SOB, cough, dizziness.  In ED, CXR suggestive of PNA.  He failed BiPAP and required intubation, PCCM consulted for admission.  STUDIES:  CXR 10/6 >> vascular congestion, b/l pleural effusions, RLL atx vs infiltrate. TTE 10/7 >> EF 60-65%, mod concentric hypertrophy, doppler parameters c/w gd I diastolic dysfxn   SIGNIFICANT EVENTS: 10/6  presented to ED, started on BiPAP but failed, required intubation. 10/7  ETT repositioned with poor volume return on vent, to 25 cm at teeth 10/8. Passed SBT, fully awake. Extubated.  10/9 hypertensive. Very congested cough, but no distress.  10/11: desaturations overnight remains on bipap 10/12: patient placed on bipap for increased WOB/RR. Patient reports distress.   SUBJECTIVE:   Desaturation overnight, remains on BiPAP today.  VITAL SIGNS: Temp:  [96.9 F (36.1 C)-99 F (37.2 C)] 97.7 F (36.5 C) (10/12 0800) Pulse Rate:  [79-107] 96 (10/12 0900) Resp:  [19-35] 33 (10/12 0900) BP: (107-174)/(65-106) 152/100 mmHg (10/12 0900) SpO2:  [89 %-99 %] 99 % (10/12 0900) FiO2 (%):  [40 %-45 %] 40 % (10/12 0802) Weight:  [88.7 kg (195 lb 8.8 oz)] 88.7 kg (195 lb 8.8 oz) (10/12 0400) Vent Mode:  [-] BIPAP FiO2 (%):  [40 %-45 %] 40 % Set Rate:  [14 bmp-20 bmp] 14 bmp PEEP:  [5 cmH20] 5 cmH20 INTAKE / OUTPUT: Intake/Output     10/11 0701 - 10/12 0700 10/12 0701 - 10/13 0700   P.O. 120    I.V. (mL/kg) 230 (2.6) 30 (0.3)   Total Intake(mL/kg) 350 (3.9) 30 (0.3)   Urine (mL/kg/hr) 862 (0.4)    Stool 101 (0)    Total Output 963     Net -613 +30        Urine Occurrence 4 x      PHYSICAL EXAMINATION: General: adult male in NAD Neuro: awake, completely oriented.  No focal def  HEENT: [place]/AT. PERRL, sclerae anicteric. Cardiovascular: RRR, no M/R/G.  Lungs: Respirations even and unlabored, coarse scattered rhonchi w/ upper airway congestion  Abdomen: BS x hypoactive, soft, NT/ND, colostomy bag in place. Musculoskeletal: No gross deformities, no edema.  Skin: Intact, warm, no rashes.  LABS:  CBC  Recent Labs Lab 09/28/14 0336 09/29/14 0357 09/30/14 0336  WBC 14.9* 15.3* 14.3*  HGB 15.9 16.1 15.9  HCT 50.7 50.5 49.6  PLT 198 201 179   Coag's  Recent Labs Lab 09/25/14 0226  APTT 28  INR 1.23   BMET  Recent Labs Lab 09/28/14 0336 09/29/14 0357 09/30/14 0336  NA 144 140 144  K 4.7 4.1 4.3  CL 101 94* 100  CO2 35* 35* 36*  BUN 37* 52* 61*  CREATININE 1.32 1.55* 1.53*  GLUCOSE 141* 144* 142*   Electrolytes  Recent Labs Lab 09/25/14 0226  09/28/14 0336 09/29/14 0357 09/30/14 0336  CALCIUM 9.0  < > 9.4 9.3 9.3  MG 1.8  --   --   --  2.7*  PHOS 2.9  --   --   --  4.7*  < > = values in this interval not displayed. Sepsis Markers  Recent Labs Lab 09/24/14 2226 09/25/14 0226 09/26/14 0330 09/27/14 0330  LATICACIDVEN 0.41* 1.4  --   --  PROCALCITON  --  <0.10 <0.10 <0.10   ABG  Recent Labs Lab 09/25/14 0107 09/25/14 0458 09/25/14 1136  PHART 7.168* 7.519* 7.566*  PCO2ART 75.3* 30.9* 27.0*  PO2ART 58.6* 100.0 102.0*   Liver Enzymes  Recent Labs Lab 09/25/14 0226 09/27/14 0330 09/28/14 0336  AST 20 20 32  ALT 12 14 40  ALKPHOS 60 62 66  BILITOT 0.3 0.3 0.3  ALBUMIN 3.3* 3.1* 3.2*   Cardiac Enzymes  Recent Labs Lab 09/24/14 2214 09/25/14 0226 09/25/14 0700 09/25/14 1225  TROPONINI <0.30 <0.30 <0.30 <0.30  PROBNP 115.2 146.1*  --   --    Glucose  Recent Labs Lab 09/28/14 2155 09/29/14 0812 09/29/14 1154 09/29/14 1610 09/29/14 2356 09/30/14 0731  GLUCAP 141* 145* 145* 166* 151* 126*    Imaging Dg Chest Port 1 View  09/30/2014   CLINICAL DATA:  Respiratory failure.  EXAM:  PORTABLE CHEST - 1 VIEW  COMPARISON:  09/29/2014  FINDINGS: Low lung volumes accentuate cardiomediastinal silhouette which is probably increased. Developing pulmonary edema. Bibasilar atelectasis with effusions. No pneumothorax. No support apparatus.  IMPRESSION: Worsening aeration.  Developing pulmonary edema.   Electronically Signed   By: Davonna Belling M.D.   On: 09/30/2014 07:14   Dg Chest Port 1 View  09/29/2014   CLINICAL DATA:  Shortness of breath, tobacco user, history of prostate cancer  EXAM: PORTABLE CHEST - 1 VIEW  COMPARISON:  09/28/2014  FINDINGS: Cardiomediastinal silhouette is stable. There is poor inspiration. Persistent small bilateral pleural effusion with bilateral basilar atelectasis or infiltrate. No pulmonary edema.  IMPRESSION: Poor inspiration. Persistent small pleural effusion with bilateral basilar atelectasis or infiltrate. No pulmonary edema.   Electronically Signed   By: Natasha Mead M.D.   On: 09/29/2014 11:13   Dg Chest Port 1 View  09/28/2014   CLINICAL DATA:  Shortness of breath, no chest pain, cough, congestion  EXAM: PORTABLE CHEST - 1 VIEW  COMPARISON:  09/28/2014  FINDINGS: Cardiomegaly cardiomegaly. Again noted small bilateral pleural effusion with bilateral basilar atelectasis or infiltrate. No pulmonary edema. There is poor inspiration.  IMPRESSION: Poor inspiration. Again noted small bilateral pleural effusion with bilateral basilar atelectasis or infiltrate. No pulmonary edema.   Electronically Signed   By: Natasha Mead M.D.   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  Distress without biPAP with  persistent cough, incr RR and marked HTN    P:    BiPAP as needed. DuoNebs / PRN Albuterol. Flutter. Cont Solumedrol to 60mg  q 12 hr. Smoking cessation counseling. Change to brovana and budesonide  Add nasal hygiene rx   CARDIOVASCULAR A:  Hx HTN, HLD - BNP, lactate, cortisol, troponin wnl. P:  Resumed Cardura & pravachol. Cont home norvasc, reduced dose coreg earlier, will go back to home dosing. Cont to hold Ace-I for now. PRN hydralazine for BP >160  RENAL A:   Acute kidney injury with reduced GFR  P:    NS @ KVO. BMP in AM. Diuresis with goal net negative 1L  GASTROINTESTINAL A:   GI prophylaxis Nutrition P:   NPO while on BiPAP for high risk intubation.  HEMATOLOGIC A:   VTE Prophylaxis P:  SCD's /  Lovenox. CBC in AM.  INFECTIOUS A:   CAP - left consolidation: rhinovirus w/ prob bacterial superinfection  Enterococcus UTI  P:   BCx2 10/7 >> UCx 10/7 >> enterococcus (amp sens) Sputum Cx 10/7 >>NPF  RVP 10/7 >> + rhinovirus  U Legionella 10/7 >> neg  U Strep 10/7 >> neg  Abx: Ceftriaxone, start date 10/6. Completed.  Abx: Azithromycin, start date 10/6, day 3/x>>> completed 3 days  Procalcitonin algorithm to limit abx exposure. Changed to oral levaquin to cover enterococcus and CAP  Abx: levaquin start date 10/9. Day 3/7  ENDOCRINE A:   DM   P:   SSI  Hold outpatient metformin.  NEUROLOGIC A:   Acute metabolic encephalopathy-->resolved.  P:   Monitor  TODAY'S SUMMARY:  Respiratory failure overnight, required BiPAP. Will cont diuresis and NIPPV. Seems atelectasis and low volumes are big issue, but also has sig upper airway cough... This might explain why he likes positive pressure as it stents the upper airway.   09/30/2014, 10:16 AM  Reviewed above, examined.  He has AECOPD 2nd to rhinovirus.  His respiratory status remains marginal.  Will continue intermittent BiPAP, nebs, oxygen.  Still at risk need for re-intubation.  Will change back to PCCM service.  Updated pt's sister at  bedside.  Coralyn HellingVineet Ginevra Tacker, MD [facility] Pulmonary/Critical Care 09/30/2014, 12:18 PM Pager:  [phone] After 3pm call: [phone]

## 2014-10-01 LAB — CULTURE, BLOOD (ROUTINE X 2)
Culture: NO GROWTH
Culture: NO GROWTH

## 2014-10-01 LAB — BASIC METABOLIC PANEL
ANION GAP: 9 (ref 5–15)
BUN: 59 mg/dL — ABNORMAL HIGH (ref 6–23)
CALCIUM: 9.2 mg/dL (ref 8.4–10.5)
CO2: 34 meq/L — AB (ref 19–32)
Chloride: 95 mEq/L — ABNORMAL LOW (ref 96–112)
Creatinine, Ser: 1.34 mg/dL (ref 0.50–1.35)
GFR calc Af Amer: 61 mL/min — ABNORMAL LOW (ref >90)
GFR, EST NON AFRICAN AMERICAN: 52 mL/min — AB (ref >90)
Glucose, Bld: 132 mg/dL — ABNORMAL HIGH (ref 70–99)
Potassium: 4.6 mEq/L (ref 3.7–5.3)
SODIUM: 138 meq/L (ref 137–147)

## 2014-10-01 LAB — CBC
HCT: 54.1 % — ABNORMAL HIGH (ref 39.0–52.0)
Hemoglobin: 17.1 g/dL — ABNORMAL HIGH (ref 13.0–17.0)
MCH: 29.6 pg (ref 26.0–34.0)
MCHC: 31.6 g/dL (ref 30.0–36.0)
MCV: 93.8 fL (ref 78.0–100.0)
PLATELETS: 176 10*3/uL (ref 150–400)
RBC: 5.77 MIL/uL (ref 4.22–5.81)
RDW: 13.5 % (ref 11.5–15.5)
WBC: 18.1 10*3/uL — AB (ref 4.0–10.5)

## 2014-10-01 MED ORDER — METHYLPREDNISOLONE SODIUM SUCC 40 MG IJ SOLR
20.0000 mg | Freq: Two times a day (BID) | INTRAMUSCULAR | Status: DC
  Administered 2014-10-01 – 2014-10-02 (×2): 20 mg via INTRAVENOUS
  Filled 2014-10-01 (×2): qty 1

## 2014-10-01 MED ORDER — BENAZEPRIL HCL 20 MG PO TABS
20.0000 mg | ORAL_TABLET | Freq: Every day | ORAL | Status: DC
  Administered 2014-10-01 – 2014-10-02 (×2): 20 mg via ORAL
  Filled 2014-10-01 (×4): qty 1

## 2014-10-01 NOTE — Progress Notes (Addendum)
PULMONARY / CRITICAL CARE MEDICINE   Name: Philip [name] MRN: [id number] DOB: 08/04/1946  PCP [name],[name], MD    ADMISSION DATE:  09/24/2014 CONSULTATION DATE:  10/01/2014  REFERRING MD :  EDP  CHIEF COMPLAINT:  SOB  INITIAL PRESENTATION: [name] with SOB, cough, dizziness.  In ED, CXR suggestive of PNA.  He failed BiPAP and required intubation, PCCM consulted for admission.  STUDIES:  CXR 10/6 >> vascular congestion, b/l pleural effusions, RLL atx vs infiltrate. TTE 10/7 >> EF 60-65%, mod concentric hypertrophy, doppler parameters c/w gd I diastolic dysfxn   SIGNIFICANT EVENTS: 10/6  presented to ED, started on BiPAP but failed, required intubation. 10/7  ETT repositioned with poor volume return on vent, to 25 cm at teeth 10/8. Passed SBT, fully awake. Extubated.  10/9 hypertensive. Very congested cough, but no distress.  10/11: desaturations overnight remains on bipap 10/12: patient placed on bipap for increased WOB/RR. Patient reports distress. Using biPAP throughout day.  10/13: patient used biPAP overnight   SUBJECTIVE:   Still has chest congestion.  Feels hungry and weak.  VITAL SIGNS: Temp:  [97.2 F (36.2 C)-98.8 F (37.1 C)] 97.9 F (36.6 C) (10/13 0400) Pulse Rate:  [78-101] 82 (10/13 0600) Resp:  [17-41] 17 (10/13 0600) BP: (123-171)/(70-103) 131/77 mmHg (10/13 0600) SpO2:  [91 %-100 %] 92 % (10/13 0726) FiO2 (%):  [40 %] 40 % (10/13 0400) Weight:  [88.7 kg (195 lb 8.8 oz)] 88.7 kg (195 lb 8.8 oz) (10/13 0500) Vent Mode:  [-] Other (Comment) FiO2 (%):  [40 %] 40 % Set Rate:  [14 bmp] 14 bmp PEEP:  [5 cmH20] 5 cmH20 6 liters  INTAKE / OUTPUT: Intake/Output     10/12 0701 - 10/13 0700 10/13 0701 - 10/14 0700   P.O.     I.V. (mL/kg) 240 (2.7)    Total Intake(mL/kg) 240 (2.7)    Urine (mL/kg/hr) 755 (0.4)    Stool 115 (0.1)    Total Output 870     Net -630            PHYSICAL EXAMINATION: General: adult male in  NAD Neuro: awake, completely oriented. No focal def  HEENT: [place]/AT. PERRL, sclerae anicteric. Cardiovascular: RRR, no M/R/G.  Lungs: b/l rhonchi, diminished respiratory excursion, no wheeze Abdomen: BS x 4, soft, NT/ND, colostomy bag in place. Musculoskeletal: No gross deformities, no edema.  Skin: Intact, warm, no rashes.  LABS:  CBC  Recent Labs Lab 09/29/14 0357 09/30/14 0336 10/01/14 0326  WBC 15.3* 14.3* 18.1*  HGB 16.1 15.9 17.1*  HCT 50.5 49.6 54.1*  PLT 201 179 176   Coag's  Recent Labs Lab 09/25/14 0226  APTT 28  INR 1.23   BMET  Recent Labs Lab 09/29/14 0357 09/30/14 0336 10/01/14 0326  NA 140 144 138  K 4.1 4.3 4.6  CL 94* 100 95*  CO2 35* 36* 34*  BUN 52* 61* 59*  CREATININE 1.55* 1.53* 1.34  GLUCOSE 144* 142* 132*   Electrolytes  Recent Labs Lab 09/25/14 0226  09/29/14 0357 09/30/14 0336 10/01/14 0326  CALCIUM 9.0  < > 9.3 9.3 9.2  MG 1.8  --   --  2.7*  --   PHOS 2.9  --   --  4.7*  --   < > = values in this interval not displayed.  Sepsis Markers  Recent Labs Lab 09/24/14 2226 09/25/14 0226 09/26/14 0330 09/27/14 0330  LATICACIDVEN 0.41* 1.4  --   --  PROCALCITON  --  <0.10 <0.10 <0.10   ABG  Recent Labs Lab 09/25/14 0107 09/25/14 0458 09/25/14 1136  PHART 7.168* 7.519* 7.566*  PCO2ART 75.3* 30.9* 27.0*  PO2ART 58.6* 100.0 102.0*   Liver Enzymes  Recent Labs Lab 09/25/14 0226 09/27/14 0330 09/28/14 0336  AST 20 20 32  ALT 12 14 40  ALKPHOS 60 62 66  BILITOT 0.3 0.3 0.3  ALBUMIN 3.3* 3.1* 3.2*   Cardiac Enzymes  Recent Labs Lab 09/24/14 2214 09/25/14 0226 09/25/14 0700 09/25/14 1225  TROPONINI <0.30 <0.30 <0.30 <0.30  PROBNP 115.2 146.1*  --   --    Glucose  Recent Labs Lab 09/29/14 0812 09/29/14 1154 09/29/14 1610 09/29/14 2356 09/30/14 0731 09/30/14 1137  GLUCAP 145* 145* 166* 151* 126* 137*    Imaging Dg Chest Port 1 View  09/30/2014   CLINICAL DATA:  Respiratory failure.  EXAM:  PORTABLE CHEST - 1 VIEW  COMPARISON:  09/29/2014  FINDINGS: Low lung volumes accentuate cardiomediastinal silhouette which is probably increased. Developing pulmonary edema. Bibasilar atelectasis with effusions. No pneumothorax. No support apparatus.  IMPRESSION: Worsening aeration.  Developing pulmonary edema.   Electronically Signed   By: Davonna BellingJohn  Curnes M.D.   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 incr RR and marked HTN  P:   BiPAP as needed. DuoNebs / PRN Albuterol. Flutter. Change Solumedrol to 20mg  q 12 hr. Continue brovana and budesonide  Add nasal hygiene rx  Mobilize   CARDIOVASCULAR A:  Hx HTN, HLD - BNP, lactate, cortisol, troponin wnl HTN P:  pravachol home meds.  Continue home norvasc, cardura, Resume lotensin PRN hydralazine for BP >160  RENAL A:   Acute kidney injury with reduced GFR  P:    NS @ KVO. BMP in AM.  GASTROINTESTINAL A:   GI prophylaxis Nutrition P:   Advance diet as tolerated  HEMATOLOGIC A:   VTE Prophylaxis Polycythemia, likely secondary to respiratory disease P:  SCD's / Lovenox. CBC in AM.  INFECTIOUS A:   Leukocytosis CAP - left consolidation: rhinovirus w/ prob bacterial superinfection  Enterococcus UTI  P:   BCx2 10/7 >> UCx 10/7 >> enterococcus (amp sens) RVP 10/7 >> + rhinovirus   Changed to oral levaquin 10/9 to cover enterococcus and CAP, Day 4/7   ENDOCRINE A:   DM   P:   SSI  Hold outpatient metformin.  NEUROLOGIC A:   Acute metabolic encephalopathy-->resolved.  P:   Monitor  TODAY'S SUMMARY:  His respiratory status remains marginal.  Will continue intermittent BiPAP, nebs, oxygen.  Still at risk need for re-intubation.  Coralyn HellingVineet Douglass Dunshee, MD [facility]  Pulmonary/Critical Care 10/01/2014, 10:51 AM Pager:  [phone] After 3pm call: [phone]

## 2014-10-02 ENCOUNTER — Inpatient Hospital Stay (HOSPITAL_COMMUNITY): Payer: Medicare Other

## 2014-10-02 DIAGNOSIS — J9811 Atelectasis: Secondary | ICD-10-CM

## 2014-10-02 DIAGNOSIS — J441 Chronic obstructive pulmonary disease with (acute) exacerbation: Secondary | ICD-10-CM

## 2014-10-02 DIAGNOSIS — G4733 Obstructive sleep apnea (adult) (pediatric): Secondary | ICD-10-CM

## 2014-10-02 DIAGNOSIS — N179 Acute kidney failure, unspecified: Secondary | ICD-10-CM

## 2014-10-02 DIAGNOSIS — N390 Urinary tract infection, site not specified: Secondary | ICD-10-CM

## 2014-10-02 DIAGNOSIS — B952 Enterococcus as the cause of diseases classified elsewhere: Secondary | ICD-10-CM

## 2014-10-02 DIAGNOSIS — D751 Secondary polycythemia: Secondary | ICD-10-CM

## 2014-10-02 LAB — BASIC METABOLIC PANEL
ANION GAP: 9 (ref 5–15)
BUN: 58 mg/dL — ABNORMAL HIGH (ref 6–23)
CO2: 33 mEq/L — ABNORMAL HIGH (ref 19–32)
CREATININE: 1.41 mg/dL — AB (ref 0.50–1.35)
Calcium: 9.2 mg/dL (ref 8.4–10.5)
Chloride: 99 mEq/L (ref 96–112)
GFR calc non Af Amer: 49 mL/min — ABNORMAL LOW (ref >90)
GFR, EST AFRICAN AMERICAN: 57 mL/min — AB (ref >90)
Glucose, Bld: 140 mg/dL — ABNORMAL HIGH (ref 70–99)
POTASSIUM: 5.2 meq/L (ref 3.7–5.3)
Sodium: 141 mEq/L (ref 137–147)

## 2014-10-02 LAB — CBC
HEMATOCRIT: 52.4 % — AB (ref 39.0–52.0)
Hemoglobin: 16.6 g/dL (ref 13.0–17.0)
MCH: 29.7 pg (ref 26.0–34.0)
MCHC: 31.7 g/dL (ref 30.0–36.0)
MCV: 93.7 fL (ref 78.0–100.0)
Platelets: 172 10*3/uL (ref 150–400)
RBC: 5.59 MIL/uL (ref 4.22–5.81)
RDW: 13.5 % (ref 11.5–15.5)
WBC: 18.7 10*3/uL — AB (ref 4.0–10.5)

## 2014-10-02 LAB — GLUCOSE, CAPILLARY: Glucose-Capillary: 168 mg/dL — ABNORMAL HIGH (ref 70–99)

## 2014-10-02 MED ORDER — PREDNISONE 20 MG PO TABS
20.0000 mg | ORAL_TABLET | Freq: Every day | ORAL | Status: DC
  Administered 2014-10-02 – 2014-10-12 (×11): 20 mg via ORAL
  Filled 2014-10-02 (×11): qty 1

## 2014-10-02 NOTE — Evaluation (Addendum)
Physical Therapy Evaluation Patient Details Name: Philip [name] MRN: [id number] DOB: 04/21/1946 Today's Date: 10/02/2014   History of Present Illness  [name] with SOB, cough, dizziness. In ED, CXR suggestive of PNA. He failed BiPAP and required intubation. Pt extubated 10/8 and still requiring bipap and oxygen  Clinical Impression  Patient initially on  6 l. O2 [place], RN placed on 45% VM due to increased RR and decreased sats at 85%. Sats ranged 84% -91%, fluctuating frequently. RR 30-42, Frequent rest breaks during exercises. Pt very participatory but is unable to attempt mobility due to DOE and WOB. Patient will benefit from PT to address problems listed in notes below.    Follow Up Recommendations SNF;Supervision/Assistance - 24 hour    Equipment Recommendations  None recommended by PT    Recommendations for Other Services       Precautions / Restrictions Precautions Precautions: Fall Precaution Comments: decreased sats and and increased RR  colostomy     Mobility  Bed Mobility               General bed mobility comments: pt in chair  Transfers Overall transfer level: Needs assistance Equipment used: 1 person hand held assist Transfers: Sit to/from Stand Sit to Stand: Mod assist         General transfer comment: not tested due to increased dyspnea and  desats  Ambulation/Gait                Stairs            Wheelchair Mobility    Modified Rankin (Stroke Patients Only)       Balance Overall balance assessment: Needs assistance Sitting-balance support: No upper extremity supported;Feet supported Sitting balance-Leahy Scale: Good                                       Pertinent Vitals/Pain Pain Assessment: No/denies pain    Home Living Family/patient expects to be discharged to:: Private residence Living Arrangements: Parent Available Help at Discharge: Family Type of Home: Apartment Home  Access: Level entry     Home Layout: One level   Additional Comments: lives with elderly mother, brother/sisters available    Prior Function Level of Independence: Independent               Hand Dominance        Extremity/Trunk Assessment   Upper Extremity Assessment: Generalized weakness           Lower Extremity Assessment: Generalized weakness      Cervical / Trunk Assessment: Normal  Communication   Communication: No difficulties;Other (comment) (SOB when talking, )  Cognition Arousal/Alertness: Awake/alert Behavior During Therapy: WFL for tasks assessed/performed Overall Cognitive Status: Within Functional Limits for tasks assessed                      General Comments      Exercises General Exercises - Upper Extremity Shoulder Flexion: AROM;Both;10 reps;Seated Shoulder ABduction: Both;10 reps Shoulder Horizontal ABduction: AROM;Both;10 reps;Seated General Exercises - Lower Extremity Ankle Circles/Pumps: AROM;Both;10 reps;Seated Long Arc Quad: AROM;Both;10 reps;Seated Hip ABduction/ADduction: AROM;Both;Seated Hip Flexion/Marching: AROM;Both;10 reps;Seated Toe Raises: AROM;Seated;Both;10 reps Heel Raises: AROM;Seated;Both;10 reps      Assessment/Plan    PT Assessment Patient needs continued PT services  PT Diagnosis Difficulty walking;Generalized weakness   PT Problem List Decreased activity  tolerance;Decreased strength;Decreased mobility;Decreased knowledge of precautions;Decreased safety awareness;Decreased knowledge of use of DME;Cardiopulmonary status limiting activity  PT Treatment Interventions DME instruction;Gait training;Functional mobility training;Therapeutic activities;Therapeutic exercise;Patient/family education   PT Goals (Current goals can be found in the Care Plan section) Acute Rehab PT Goals Patient Stated Goal: get better!, walk PT Goal Formulation: With patient Time For Goal Achievement: 2014-04-04 Potential to  Achieve Goals: Good    Frequency Min 3X/week   Barriers to discharge Decreased caregiver support      Co-evaluation               End of Session   Activity Tolerance: Treatment limited secondary to medical complications (Comment) (decreased sats, incr RR) Patient left: with call bell/phone within reach Nurse Communication: Mobility status         Time: [id number] PT Time Calculation (min): 24 min   Charges:   PT Evaluation $Initial PT Evaluation Tier I: 1 Procedure PT Treatments $Therapeutic Exercise: 23-37 mins   PT G Codes:          Rada HayHill, Philip [name] 10/02/2014, 2:30 PM Blanchard KelchKaren Altagracia [name] PT [phone]

## 2014-10-02 NOTE — Progress Notes (Signed)
PULMONARY / CRITICAL CARE MEDICINE   Name: Philip [name] MRN: [id number] DOB: 04/13/1946  PCP [name],[name], MD    ADMISSION DATE:  09/24/2014 CONSULTATION DATE:  10/02/2014  REFERRING MD :  EDP  CHIEF COMPLAINT:  SOB  INITIAL PRESENTATION: [name] with SOB, cough, dizziness.  In ED, CXR suggestive of PNA.  He failed BiPAP and required intubation, PCCM consulted for admission.  STUDIES:  CXR 10/6 >> vascular congestion, b/l pleural effusions, RLL atx vs infiltrate. TTE 10/7 >> EF 60-65%, mod concentric hypertrophy, doppler parameters c/w gd I diastolic dysfxn   SIGNIFICANT EVENTS: 10/6  presented to ED, started on BiPAP but failed, required intubation. 10/7  ETT repositioned with poor volume return on vent, to 25 cm at teeth 10/8. Passed SBT, fully awake. Extubated.  10/9 hypertensive. Very congested cough, but no distress.  10/11: desaturations overnight remains on bipap 10/12: patient placed on bipap for increased WOB/RR. Patient reports distress. Using biPAP throughout day.  10/13: patient used biPAP overnight 10/14: up in chair. Looking better.  SUBJECTIVE:   Still has chest congestion.  Feels hungry and weak. Frustrated but not able to point out one specific complaint other than still upset that he was NPO when on BIPAP on 10/12   VITAL SIGNS: Temp:  [97 F (36.1 C)-97.9 F (36.6 C)] 97.3 F (36.3 C) (10/14 0400) Pulse Rate:  [79-97] 88 (10/14 0600) Resp:  [21-43] 32 (10/14 0600) BP: (111-159)/(63-87) 143/76 mmHg (10/14 0600) SpO2:  [86 %-96 %] 92 % (10/14 0740) FiO2 (%):  [40 %-50 %] 50 % (10/14 0000) Weight:  [198 lb 3.1 oz (89.9 kg)] 198 lb 3.1 oz (89.9 kg) (10/14 0400) Vent Mode:  [-]  FiO2 (%):  [40 %-50 %] 50 % 6 liters  INTAKE / OUTPUT: Intake/Output     10/13 0701 - 10/14 0700 10/14 0701 - 10/15 0700   P.O. 360    I.V. (mL/kg) 240 (2.7)    Total Intake(mL/kg) 600 (6.7)    Urine (mL/kg/hr) 1125 (0.5)    Stool 50 (0)    Total  Output 1175     Net -575            PHYSICAL EXAMINATION: General: adult male in NAD Neuro: awake, completely oriented. No focal def  HEENT: [place]/AT. PERRL, sclerae anicteric. Cardiovascular: RRR, no M/R/G.  Lungs: b/l rhonchi, diminished respiratory excursion, no wheeze, no accessory muscle  Abdomen: BS x 4, soft, NT/ND, colostomy bag in place. Musculoskeletal: No gross deformities, no edema.  Skin: Intact, warm, no rashes.  LABS:  CBC  Recent Labs Lab 09/30/14 0336 10/01/14 0326 10/02/14 0325  WBC 14.3* 18.1* 18.7*  HGB 15.9 17.1* 16.6  HCT 49.6 54.1* 52.4*  PLT 179 176 172   BMET  Recent Labs Lab 09/30/14 0336 10/01/14 0326 10/02/14 0325  NA 144 138 141  K 4.3 4.6 5.2  CL 100 95* 99  CO2 36* 34* 33*  BUN 61* 59* 58*  CREATININE 1.53* 1.34 1.41*  GLUCOSE 142* 132* 140*   Electrolytes  Recent Labs Lab 09/30/14 0336 10/01/14 0326 10/02/14 0325  CALCIUM 9.3 9.2 9.2  MG 2.7*  --   --   PHOS 4.7*  --   --     Sepsis Markers  Recent Labs Lab 09/26/14 0330 09/27/14 0330  PROCALCITON <0.10 <0.10   ABG  Recent Labs Lab 09/25/14 1136  PHART 7.566*  PCO2ART 27.0*  PO2ART 102.0*   Liver Enzymes  Recent Labs Lab 09/27/14 0330 09/28/14 0336  AST 20 32  ALT 14 40  ALKPHOS 62 66  BILITOT 0.3 0.3  ALBUMIN 3.1* 3.2*   Cardiac Enzymes  Recent Labs Lab 09/25/14 1225  TROPONINI <0.30   Glucose  Recent Labs Lab 09/29/14 0812 09/29/14 1154 09/29/14 1610 09/29/14 2356 09/30/14 0731 09/30/14 1137  GLUCAP 145* 145* 166* 151* 126* 137*    Imaging Dg Chest Port 1 View  10/02/2014   CLINICAL DATA:  COPD exacerbation.  EXAM: PORTABLE CHEST - 1 VIEW  COMPARISON:  09/30/2014, 09/29/2014 09/28/2014  FINDINGS: There is persistent bibasilar atelectasis, left greater right. Heart size and pulmonary vascularity are normal. Shallow inspiration. No acute osseous abnormalities.  IMPRESSION: Persistent bibasilar atelectasis.   Electronically Signed    By: Geanie CooleyJim  Maxwell M.D.   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 incr RR P:   BiPAP at HS and PRN DuoNebs / PRN Albuterol. Flutter. D/c solumedrol and change to prednisone 20 mg on 10/14 Continue brovana and budesonide  Add nasal hygiene rx  Mobilize   CARDIOVASCULAR A:  Hx HTN, HLD - BNP, lactate, cortisol, troponin wnl HTN P:  pravachol home meds.  Continue home norvasc, cardura, lotensin PRN hydralazine for BP >160  RENAL A:   Acute kidney injury with reduced GFR -->holding stable  P:    NS @ KVO. BMP in AM. D/c foley 10/14  GASTROINTESTINAL A:   GI prophylaxis Nutrition P:   Advance diet as tolerated Colostomy care  HEMATOLOGIC A:   VTE Prophylaxis Polycythemia, likely secondary to respiratory disease P:  SCD's / Lovenox. CBC in AM.  INFECTIOUS A:   Leukocytosis CAP - left consolidation: rhinovirus w/ prob bacterial superinfection  Enterococcus UTI  P:   BCx2 10/7 >> neg  UCx 10/7 >> enterococcus (amp sens) RVP 10/7 >> + rhinovirus   Changed to oral levaquin 10/9 to cover enterococcus and CAP, Day 5/7   ENDOCRINE A:   DM   P:   SSI  Hold outpatient metformin.  NEUROLOGIC A:   Acute metabolic encephalopathy-->resolved.  Deconditioning. P:   Physical therapy to assess  TODAY'S SUMMARY:  His respiratory status is a little better. Will get PT to work with him. Will give him one more night in SDU for nocturnal BIPAP-->lung volumes remain low.   Reviewed above, examined.  He is slowly improving.  Needs at least one more day of monitoring in SDU.  Coralyn HellingVineet Zamarah Ullmer, MD [facility] Pulmonary/Critical Care 10/02/2014, 10:10 AM Pager:  [phone] After 3pm call:  [phone]

## 2014-10-02 NOTE — Evaluation (Signed)
Occupational Therapy Evaluation Patient Details Name: Philip [name] MRN: [id number] DOB: 09/02/1946 Today's Date: 10/02/2014    History of Present Illness [name] [facility] with SOB, cough, dizziness. In ED, CXR suggestive of PNA. He failed BiPAP and required intubation. Pt extubated 10/8 and still requiring bipap.   Clinical Impression   Pt admitted with respiratory failure. Pt currently with functional limitations due to the deficits listed below (see OT Problem List).  Pt will benefit from skilled OT to increase their safety and independence with ADL and functional mobility for ADL to facilitate discharge to venue listed below.      Follow Up Recommendations  SNF          Precautions / Restrictions Precautions Precautions: Fall Precaution Comments: decreased sats and and increased RR      Mobility Bed Mobility               General bed mobility comments: pt in chair  Transfers Overall transfer level: Needs assistance Equipment used: 1 person hand held assist Transfers: Sit to/from Stand Sit to Stand: Mod assist         General transfer comment: sit to stand   3 times with standing in front of chair         ADL Overall ADL's : Needs assistance/impaired Eating/Feeding: Minimal assistance;Sitting                       Toilet Transfer: Moderate assistance Toilet Transfer Details (indicate cue type and reason): sit to stand only Toileting- ArchitectClothing Manipulation and Hygiene: Sit to/from stand;Moderate assistance Toileting - Clothing Manipulation Details (indicate cue type and reason): sit to stand only       General ADL Comments: Pt sitting in chair upon OT arrival. Pt had on facemask 45 percent.  Pt needed VC to take deep breathe and slow down breathing especially with standing.  RR increased to 42 with standing     Vision                            Pertinent Vitals/Pain Pain Assessment: No/denies pain     Hand  Dominance     Extremity/Trunk Assessment Upper Extremity Assessment Upper Extremity Assessment: Generalized weakness           Communication Communication Communication: No difficulties;Other (comment) (SOB quickly)   Cognition Arousal/Alertness: Awake/alert Behavior During Therapy: WFL for tasks assessed/performed Overall Cognitive Status: Within Functional Limits for tasks assessed                     General Comments   pt agreeable and motivated            Home Living Family/patient expects to be discharged to:: Private residence Living Arrangements: Parent Available Help at Discharge: Family Type of Home: Apartment Home Access: Level entry     Home Layout: One level     Bathroom Shower/Tub: Chief Strategy OfficerTub/shower unit   Bathroom Toilet: Standard                Prior Functioning/Environment Level of Independence: Independent             OT Diagnosis: Generalized weakness   OT Problem List: Decreased strength;Decreased activity tolerance;Cardiopulmonary status limiting activity   OT Treatment/Interventions: Self-care/ADL training;DME and/or AE instruction;Patient/family education;Energy conservation    OT Goals(Current goals can be found in the care plan section) Acute Rehab OT  Goals Patient Stated Goal: get better! OT Goal Formulation: With patient Time For Goal Achievement: 01-22-2014 Potential to Achieve Goals: Good  OT Frequency: Min 2X/week   Barriers to D/C: Decreased caregiver support             End of Session Equipment Utilized During Treatment: Gait belt Nurse Communication: Mobility status  Activity Tolerance: Patient tolerated treatment well;Patient limited by fatigue Patient left: in chair;with call bell/phone within reach     Charges:  OT General Charges $OT Visit: 1 Procedure OT Evaluation $Initial OT Evaluation Tier I: 1 Procedure OT Treatments $Self Care/Home Management : 8-22 mins G-Codes:    Einar CrowEDDING, Johnrobert Foti  D 10/02/2014, 1:31 PM

## 2014-10-03 LAB — CBC
HEMATOCRIT: 51.3 % (ref 39.0–52.0)
Hemoglobin: 15.6 g/dL (ref 13.0–17.0)
MCH: 29.5 pg (ref 26.0–34.0)
MCHC: 30.4 g/dL (ref 30.0–36.0)
MCV: 97 fL (ref 78.0–100.0)
PLATELETS: 152 10*3/uL (ref 150–400)
RBC: 5.29 MIL/uL (ref 4.22–5.81)
RDW: 13.6 % (ref 11.5–15.5)
WBC: 19.2 10*3/uL — AB (ref 4.0–10.5)

## 2014-10-03 LAB — BASIC METABOLIC PANEL
Anion gap: 6 (ref 5–15)
BUN: 69 mg/dL — ABNORMAL HIGH (ref 6–23)
CO2: 35 meq/L — AB (ref 19–32)
Calcium: 9.2 mg/dL (ref 8.4–10.5)
Chloride: 99 mEq/L (ref 96–112)
Creatinine, Ser: 1.67 mg/dL — ABNORMAL HIGH (ref 0.50–1.35)
GFR calc Af Amer: 47 mL/min — ABNORMAL LOW (ref >90)
GFR calc non Af Amer: 40 mL/min — ABNORMAL LOW (ref >90)
GLUCOSE: 133 mg/dL — AB (ref 70–99)
POTASSIUM: 5.4 meq/L — AB (ref 3.7–5.3)
SODIUM: 140 meq/L (ref 137–147)

## 2014-10-03 MED ORDER — IPRATROPIUM-ALBUTEROL 0.5-2.5 (3) MG/3ML IN SOLN
3.0000 mL | Freq: Four times a day (QID) | RESPIRATORY_TRACT | Status: DC
  Administered 2014-10-03 – 2014-10-16 (×51): 3 mL via RESPIRATORY_TRACT
  Filled 2014-10-03 (×51): qty 3

## 2014-10-03 MED ORDER — SODIUM CHLORIDE 0.9 % IV SOLN
250.0000 mL | INTRAVENOUS | Status: DC
  Administered 2014-10-03: 250 mL via INTRAVENOUS
  Administered 2014-10-04 – 2014-10-05 (×2): via INTRAVENOUS

## 2014-10-03 MED ORDER — ACETYLCYSTEINE 20 % IN SOLN
4.0000 mL | Freq: Four times a day (QID) | RESPIRATORY_TRACT | Status: AC
  Administered 2014-10-03 – 2014-10-04 (×3): 4 mL via RESPIRATORY_TRACT
  Filled 2014-10-03 (×4): qty 4

## 2014-10-03 MED ORDER — VANCOMYCIN HCL IN DEXTROSE 750-5 MG/150ML-% IV SOLN
750.0000 mg | Freq: Two times a day (BID) | INTRAVENOUS | Status: DC
  Administered 2014-10-03 – 2014-10-04 (×4): 750 mg via INTRAVENOUS
  Filled 2014-10-03 (×5): qty 150

## 2014-10-03 MED ORDER — DEXTROSE 5 % IV SOLN
2.0000 g | Freq: Two times a day (BID) | INTRAVENOUS | Status: DC
  Administered 2014-10-03 – 2014-10-04 (×2): 2 g via INTRAVENOUS
  Filled 2014-10-03 (×3): qty 2

## 2014-10-03 MED ORDER — SODIUM CHLORIDE 0.9 % IV SOLN: 250.0000 mL | INTRAVENOUS | Status: DC | PRN

## 2014-10-03 MED ORDER — ENSURE COMPLETE PO LIQD
237.0000 mL | ORAL | Status: DC
  Administered 2014-10-03 – 2014-10-09 (×3): 237 mL via ORAL

## 2014-10-03 NOTE — Progress Notes (Signed)
NUTRITION FOLLOW UP  Intervention:   - Recommend strawberry Ensure Complete once daily - Encouraged intake - RD to continue to monitor   Nutrition Dx:   Inadequate oral intake related to inability to eat as evidenced by NPO status - no long appropriate, diet advanced  New nutrition dx: Increased nutrient needs related to community acquired pneumonia and COPD as evidenced by MD notes.   Goal:  Pt to meet >/= 90% of their estimated nutrition needs   Monitor:   Weights, labs, intake, renal profile  Assessment:   [name] brought to [facility] ED on 10/6 with SOB, cough, dizziness. In ED, CXR suggestive of PNA. He failed BiPAP and required intubation.   10/7: - Per weight history documentation, pt weight has been stable.  - Per family, pt has not been eating well x 3 weeks d/t decreased appetite. Family states that pt may only eat a bowl of cereal all day, if eating at all. Cough and symptoms appeared around 2 weeks ago.  10/8: - Extubated this morning, diet advanced to diabetic diet, pt eating 100% of meals - Met with pt who reports not eating much of anything since Monday PTA due to not feeling well however before then had excellent appetite and was eating 3 meals/day with blood sugars under control   10/15: -Pt reported decreased appetite for past several days. Noted chest congestion and Bipap use inhibits PO intake ability to eat. Denied nausea or abd pain. -Consuming < 50% of meals -Was willing to trial use of Ensure supplements for nutrient replenishment  -AKI r/t dehydration from colostomy. NS increased.   -Phos/Mg elevated on 10/12, K mildly elevated at 5.4. Diet restriction not warranted at this time as pt with decreased intake and would benefit from liberalized options   Height: Ht Readings from Last 1 Encounters:  09/24/14 _0  (1.778 m)    Weight Status:   Wt Readings from Last 1 Encounters:  10/02/14 198 lb 3.1 oz (89.9 kg)  Admit wt         205 lb (92.9  kg)   Re-estimated needs:  Kcal: 1900-2100 Protein: 90-110g Fluid: 1.9-2.1L/day   Skin: intact   Diet Order: Carb Control   Intake/Output Summary (Last 24 hours) at 10/03/14 1229 Last data filed at 10/03/14 1140  Gross per 24 hour  Intake      0 ml  Output   1300 ml  Net  -1300 ml    Last BM: 10/15   Labs:   Recent Labs Lab 09/30/14 0336 10/01/14 0326 10/02/14 0325 10/03/14 0320  NA 144 138 141 140  K 4.3 4.6 5.2 5.4*  CL 100 95* 99 99  CO2 36* 34* 33* 35*  BUN 61* 59* 58* 69*  CREATININE 1.53* 1.34 1.41* 1.67*  CALCIUM 9.3 9.2 9.2 9.2  MG 2.7*  --   --   --   PHOS 4.7*  --   --   --   GLUCOSE 142* 132* 140* 133*    CBG (last 3)   Recent Labs  10/02/14 0802  GLUCAP 168*    Scheduled Meds: . acetylcysteine  4 mL Nebulization Q6H  . amLODipine  10 mg Oral Daily  . antiseptic oral rinse  7 mL Mouth Rinse BID  . carvedilol  25 mg Oral BID WC  . cefTAZidime (FORTAZ)  IV  2 g Intravenous Q12H  . cloNIDine  0.1 mg Oral TID  . docusate sodium  100 mg Oral Daily  .  doxazosin  2 mg Oral Daily  . enoxaparin (LOVENOX) injection  40 mg Subcutaneous Q24H  . fluticasone  2 spray Each Nare BID  . guaiFENesin  1,200 mg Oral BID  . Influenza vac split quadrivalent PF  0.5 mL Intramuscular Tomorrow-1000  . ipratropium-albuterol  3 mL Nebulization Q6H  . pantoprazole  40 mg Oral Q1200  . pravastatin  20 mg Oral q1800  . predniSONE  20 mg Oral Q breakfast  . vancomycin  750 mg Intravenous Q12H   Atlee Abide MS RD LDN Clinical Dietitian [phone]

## 2014-10-03 NOTE — Progress Notes (Signed)
PULMONARY / CRITICAL CARE MEDICINE   Name: Philip [name] MRN: [id number] DOB: 02/24/1946  PCP [name],[name], MD    ADMISSION DATE:  09/24/2014 CONSULTATION DATE:  10/03/2014  REFERRING MD :  EDP  CHIEF COMPLAINT:  SOB  INITIAL PRESENTATION: [name] [facility] with SOB, cough, dizziness.  In ED, CXR suggestive of PNA.  He failed BiPAP and required intubation, PCCM consulted for admission.  STUDIES:  CXR 10/6 >> vascular congestion, b/l pleural effusions, RLL atx vs infiltrate. TTE 10/7 >> EF 60-65%, mod concentric hypertrophy, doppler parameters c/w gd I diastolic dysfxn   SIGNIFICANT EVENTS: 10/6  presented to ED, started on BiPAP but failed, required intubation. 10/7  ETT repositioned with poor volume return on vent, to 25 cm at teeth 10/8. Passed SBT, fully awake. Extubated.  10/9 hypertensive. Very congested cough, but no distress.  10/11: desaturations overnight remains on bipap 10/12: patient placed on bipap for increased WOB/RR. Patient reports distress. Using biPAP throughout day.  10/13: patient used biPAP overnight 10/14: up in chair. Looking better. PT and OT suggesting SNF.  10/15 FIO2 needs increased. Feels worse.   SUBJECTIVE:   Still has chest congestion.  Won't wear BIPAP. Feels worse. FIO2 requirements increased.  VITAL SIGNS: Temp:  [96.1 F (35.6 C)-97.7 F (36.5 C)] 97.4 F (36.3 C) (10/15 0000) Pulse Rate:  [77-98] 85 (10/15 0600) Resp:  [15-34] 29 (10/15 0600) BP: (114-148)/(42-97) 134/97 mmHg (10/15 0600) SpO2:  [83 %-99 %] 95 % (10/15 0728) FiO2 (%):  [45 %-100 %] 100 % (10/15 0728) Vent Mode:  [-]  FiO2 (%):  [45 %-100 %] 100 % NRB  INTAKE / OUTPUT: Intake/Output     10/14 0701 - 10/15 0700 10/15 0701 - 10/16 0700   P.O.     I.V. (mL/kg)     Total Intake(mL/kg)     Urine (mL/kg/hr) 750 (0.3)    Stool 750 (0.3)    Total Output 1500     Net -1500          Urine Occurrence 1 x      PHYSICAL EXAMINATION: General: adult  male in NAD Neuro: awake, completely oriented. No focal def  HEENT: [place]/AT. PERRL, sclerae anicteric. Cardiovascular: RRR, no M/R/G.  Lungs: Coarse b/l rhonchi, diminished respiratory excursion, no wheeze, no accessory muscle  Abdomen: BS x 4, soft, NT/ND, colostomy bag in place. Musculoskeletal: No gross deformities, no edema.  Skin: Intact, warm, no rashes.  LABS:  CBC  Recent Labs Lab 10/01/14 0326 10/02/14 0325 10/03/14 0320  WBC 18.1* 18.7* 19.2*  HGB 17.1* 16.6 15.6  HCT 54.1* 52.4* 51.3  PLT 176 172 152   BMET  Recent Labs Lab 10/01/14 0326 10/02/14 0325 10/03/14 0320  NA 138 141 140  K 4.6 5.2 5.4*  CL 95* 99 99  CO2 34* 33* 35*  BUN 59* 58* 69*  CREATININE 1.34 1.41* 1.67*  GLUCOSE 132* 140* 133*   Electrolytes  Recent Labs Lab 09/30/14 0336 10/01/14 0326 10/02/14 0325 10/03/14 0320  CALCIUM 9.3 9.2 9.2 9.2  MG 2.7*  --   --   --   PHOS 4.7*  --   --   --     Sepsis Markers  Recent Labs Lab 09/27/14 0330  PROCALCITON <0.10   ABG No results found for this basename: PHART, PCO2ART, PO2ART,  in the last 168 hours Liver Enzymes  Recent Labs Lab 09/27/14 0330 09/28/14 0336  AST 20 32  ALT  14 40  ALKPHOS 62 66  BILITOT 0.3 0.3  ALBUMIN 3.1* 3.2*   Glucose  Recent Labs Lab 09/29/14 1154 09/29/14 1610 09/29/14 2356 09/30/14 0731 09/30/14 1137 10/02/14 0802  GLUCAP 145* 166* 151* 126* 137* 168*    Imaging Dg Chest Port 1 View  10/02/2014   CLINICAL DATA:  COPD exacerbation.  EXAM: PORTABLE CHEST - 1 VIEW  COMPARISON:  09/30/2014, 09/29/2014 09/28/2014  FINDINGS: There is persistent bibasilar atelectasis, left greater right. Heart size and pulmonary vascularity are normal. Shallow inspiration. No acute osseous abnormalities.  IMPRESSION: Persistent bibasilar atelectasis.   Electronically Signed   By: Geanie CooleyJim  Maxwell M.D.   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 incr RR and FIO2 needs. ? Worsening infection ? Persistent ATX? Don't think this is volume overload.  P:   BiPAP at HS and PRN DuoNebs / PRN Albuterol. Flutter. Cont prednisone 20 mg  Continue brovana and budesonide  Added nasal hygiene rx  Mobilize  See if we can get high flow O2 device   CARDIOVASCULAR A:  Hx HTN, HLD - BNP, lactate, cortisol, troponin wnl HTN P:  pravachol home meds.  Continue home norvasc, cardura, lotensin PRN hydralazine for BP >160  RENAL A:   Acute kidney injury with reduced GFR -->worse w/ mild hypernatremia  > per his PCP this is typical for him when he gets dehydrated w/ his colostomy P:    Increase NS to 100 ml/hr  BMP in AM.   GASTROINTESTINAL A:   GI prophylaxis Nutrition P:   Advance diet as tolerated Colostomy care  HEMATOLOGIC A:   VTE Prophylaxis Polycythemia, likely secondary to respiratory disease P:  SCD's / Lovenox. CBC in AM.  INFECTIOUS A:   Leukocytosis CAP - left consolidation: rhinovirus w/ prob bacterial superinfection  Enterococcus UTI  P:   BCx2 10/7 >> neg  UCx 10/7 >> enterococcus (amp sens) RVP 10/7 >> + rhinovirus   Changed to oral levaquin 10/9>>>10/15 Vanc 10/15>>> ceftaz 10/15>>> Ck PCT  ENDOCRINE A:   DM   P:   SSI  Hold outpatient metformin.  NEUROLOGIC A:   Acute metabolic encephalopathy-->resolved.  Deconditioning. P:   Physical therapy to assess  TODAY'S SUMMARY:  His respiratory status is a little worse. WBCs climbing. Has persistent productive cough. Will widen to rx for HCAP. Also scr increased. Has had issues w/ this in past w/ tendency to get volume depleted very easily per his PCP. Will add Back IVFs. Have asked RT to see if we can get high flow. Think we may need to  consider LTAC if we can get his O2 status stabilized.    Anders SimmondsPete Babcock ACNP-BC [facility] Pager # [phone] OR # [phone] if no answer   Reviewed above, and examined.  He is needing increased supplement oxygen.  Concerned he is not able to mobilize his secretions >> will add mucomyst, chest PT, and change to schedule duoneb.  Continue supplemental oxygen >> he is not using BiPAP.  Will add IV fluid with increased creatinine, and d/c lotensin.  F/u CXR 10/16.  Coralyn HellingVineet Fayth Trefry, MD [facility] 10/03/2014, 9:41 AM Pager:  [phone] After 3pm call: [phone]

## 2014-10-03 NOTE — Progress Notes (Signed)
CSW received referral for New SNF.  CSW reviewed chart and noted that pt currently on Non-Rebreather mask at 100%. Non-rebreather mask cannot be managed at SNF level of care.  CSW will continue to follow pt progress and assess for New SNF when oxygen level are appropriate for SNF level of care.  CSW to continue to follow.  Loletta SpecterSuzanna Kidd, MSW, LCSW Clinical Social Work [phone]

## 2014-10-03 NOTE — Progress Notes (Signed)
PT Cancellation Note  Patient Details Name: Philip [name] MRN: [id number] DOB: 04/04/1946   Cancelled Treatment:    Reason Eval/Treat Not Completed:  (on NRB mask, respiratory distress , not able to mobilize beyond recliner today. )   Rada HayHill, Macy Lingenfelter Elizabeth 10/03/2014, 9:36 AM Blanchard KelchKaren Tyara Dassow PT [phone]

## 2014-10-03 NOTE — Progress Notes (Signed)
ANTIBIOTIC CONSULT NOTE - INITIAL  Pharmacy Consult for Vancomycin/Ceftazidime Indication: pneumonia  No Known Allergies  Patient Measurements: Height: 5\' 10"  (177.8 cm) Weight: 198 lb 3.1 oz (89.9 kg) IBW/kg (Calculated) : 73   Vital Signs: Temp: 97.4 F (36.3 C) (10/15 0000) Temp Source: Oral (10/15 0000) BP: 134/97 mmHg (10/15 0600) Pulse Rate: 85 (10/15 0600) Intake/Output from previous day: 10/14 0701 - 10/15 0700 In: -  Out: 1500 [Urine:750; Stool:750] Intake/Output from this shift:    Labs:  Recent Labs  10/01/14 0326 10/02/14 0325 10/03/14 0320  WBC 18.1* 18.7* 19.2*  HGB 17.1* 16.6 15.6  PLT 176 172 152  CREATININE 1.34 1.41* 1.67*   Estimated Creatinine Clearance: 47.1 ml/min (by C-G formula based on Cr of 1.67). No results found for this basename: VANCOTROUGH, VANCOPEAK, VANCORANDOM, [place], GENTPEAK, GENTRANDOM, TOBRATROUGH, TOBRAPEAK, TOBRARND, AMIKACINPEAK, AMIKACINTROU, AMIKACIN,  in the last 72 hours   Microbiology: Recent Results (from the past 720 hour(s))  CULTURE, BLOOD (ROUTINE X 2)     Status: None   Collection Time    09/24/14 10:14 PM      Result Value Ref Range Status   Specimen Description BLOOD LEFT LAT WRIST   Final   Special Requests BOTTLES DRAWN AEROBIC AND ANAEROBIC 5CC   Final   Culture  Setup Time     Final   Value: 09/25/2014 01:54     Performed at [facility]   Culture     Final   Value: NO GROWTH 5 DAYS     Performed at [facility]   Report Status 10/01/2014 FINAL   Final  CULTURE, BLOOD (ROUTINE X 2)     Status: None   Collection Time    09/24/14 10:19 PM      Result Value Ref Range Status   Specimen Description BLOOD RIGHT WRIST   Final   Special Requests BOTTLES DRAWN AEROBIC AND ANAEROBIC 4CC   Final   Culture  Setup Time     Final   Value: 09/25/2014 01:54     Performed at [facility]   Culture     Final   Value: NO GROWTH 5 DAYS     Performed at [facility]   Report  Status 10/01/2014 FINAL   Final  URINE CULTURE     Status: None   Collection Time    09/25/14  1:07 AM      Result Value Ref Range Status   Specimen Description URINE, CATHETERIZED   Final   Special Requests Normal   Final   Culture  Setup Time     Final   Value: 09/25/2014 06:24     Performed at [place]     Final   Value: 15,000 COLONIES/ML     Performed at [facility]   Culture     Final   Value: ENTEROCOCCUS SPECIES     Performed at [facility]   Report Status 09/27/2014 FINAL   Final   Organism ID, Bacteria ENTEROCOCCUS SPECIES   Final  RESPIRATORY VIRUS PANEL     Status: Abnormal   Collection Time    09/25/14  3:30 AM      Result Value Ref Range Status   Source - RVPAN NOSE   Final   Respiratory Syncytial Virus A NOT DETECTED   Final   Respiratory Syncytial Virus B NOT DETECTED   Final   Influenza A NOT DETECTED   Final  Influenza B NOT DETECTED   Final   Parainfluenza 1 NOT DETECTED   Final   Parainfluenza 2 NOT DETECTED   Final   Parainfluenza 3 NOT DETECTED   Final   Metapneumovirus NOT DETECTED   Final   Rhinovirus DETECTED (*)  Final   Adenovirus NOT DETECTED   Final   Influenza A H1 NOT DETECTED   Final   Influenza A H3 NOT DETECTED   Final   Comment: (NOTE)           Normal Reference Range for each Analyte: NOT DETECTED     Testing performed using the Luminex xTAG Respiratory Viral Panel test     kit.     The analytical performance characteristics of this assay have been     determined by [facility].  The modifications have not been     cleared or approved by the FDA. This assay has been validated pursuant     to the CLIA regulations and is used for clinical purposes.     Performed at [place] PCR SCREENING     Status: None   Collection Time    09/25/14  3:32 AM      Result Value Ref Range Status   MRSA by PCR NEGATIVE  NEGATIVE Final   Comment:            The GeneXpert MRSA Assay  (FDA     approved for NASAL specimens     only), is one component of a     comprehensive MRSA colonization     surveillance program. It is not     intended to diagnose MRSA     infection nor to guide or     monitor treatment for     MRSA infections.  CULTURE, RESPIRATORY (NON-EXPECTORATED)     Status: None   Collection Time    09/25/14  4:20 AM      Result Value Ref Range Status   Specimen Description TRACHEAL ASPIRATE   Final   Special Requests Normal   Final   Gram Stain     Final   Value: ABUNDANT WBC PRESENT,BOTH PMN AND MONONUCLEAR     RARE SQUAMOUS EPITHELIAL CELLS PRESENT     NO ORGANISMS SEEN     Performed at [facility]   Culture     Final   Value: Non-Pathogenic Oropharyngeal-type [place] Isolated.     Performed at [facility]   Report Status 09/27/2014 FINAL   Final    Medical History: Past Medical History  Diagnosis Date  . Alcohol abuse, in remission 2004  . Diabetes mellitus     Type II  . HTN (hypertension)   . Hyperlipidemia   . History of prostate cancer   . Vitamin D deficiency   . Tobacco abuse   . History of colostomy 2004  . History of MRSA infection 2009  . Intestinal ischemia 2004    Assessment: 11 yoM admitted 10/6 with SOB, cough, dizziness requiring intubation after failing BiPAP.  CXR on admission suggestive of PNA and patient has been on antibiotics for CAP and Enterococcus UTI.  Today is day #10 total antibiotics (day #10 total antibiotics for treatment of CAP + AECOPD and day #7 total antibiotics for treatment of Enterococcus UTI).  Patient also positive for Rhinovirus.  Today, FIO2 needs increased and patient feels worse.  MD concerned for worsening infection and broadening antibiotics for nosocomial organisms.  Pharmacy consulted to dose  vancomycin and ceftazidime.  CXR shows persistent bibasilar atelectasis.  10/6 >> Ceftriaxone >>10/8 10/7 >> Azithromycin x 1 10/9 >> Levaquin >> 10/15 10/15 >> Vancomycin >> 10/15 >>  Ceftazidime >>  Tmax: temps low WBC: elevated and rising (on steroids) Renal: SCr elevated at 1.67, CrCl~52 ml/min (CG), ~42 ml/min (normalized)  Micro data: 10/6 blood cx x 2: NGF 10/7 respiratory virus panel: Rhinovirus + 10/7 urine cx: Enterococcus species (R- tetracycline, S-ampicillin, levaquin, NTF, Vanc) 10/7 trach aspirate cx: oral flora 10/7 MRSA screen: negative  Goal of Therapy:  Vancomycin trough level 15-20 mcg/ml Doses adjusted per renal function Eradication of infection  Plan:  1.  Vancomycin 750 mg IV q12h. 2.  Ceftazidime 2g IV q12h.  Will consider adjusting to q8h dosing in AM if SCr improves. 3.  F/u SCr, trough levels, clinical course.  Hershal Coria 10/03/2014,9:05 AM

## 2014-10-03 NOTE — Progress Notes (Signed)
RT informed RN at High Flow Nasal Cannula [facility] at [facility].

## 2014-10-04 ENCOUNTER — Inpatient Hospital Stay (HOSPITAL_COMMUNITY): Payer: Medicare Other

## 2014-10-04 DIAGNOSIS — J9811 Atelectasis: Secondary | ICD-10-CM

## 2014-10-04 LAB — COMPREHENSIVE METABOLIC PANEL
ALT: 32 U/L (ref 0–53)
ANION GAP: 5 (ref 5–15)
AST: 21 U/L (ref 0–37)
Albumin: 3 g/dL — ABNORMAL LOW (ref 3.5–5.2)
Alkaline Phosphatase: 53 U/L (ref 39–117)
BUN: 51 mg/dL — AB (ref 6–23)
CO2: 34 meq/L — AB (ref 19–32)
Calcium: 9.2 mg/dL (ref 8.4–10.5)
Chloride: 95 mEq/L — ABNORMAL LOW (ref 96–112)
Creatinine, Ser: 1.27 mg/dL (ref 0.50–1.35)
GFR calc non Af Amer: 56 mL/min — ABNORMAL LOW (ref >90)
GFR, EST AFRICAN AMERICAN: 65 mL/min — AB (ref >90)
GLUCOSE: 103 mg/dL — AB (ref 70–99)
POTASSIUM: 5 meq/L (ref 3.7–5.3)
Sodium: 134 mEq/L — ABNORMAL LOW (ref 137–147)
TOTAL PROTEIN: 6.8 g/dL (ref 6.0–8.3)
Total Bilirubin: 0.4 mg/dL (ref 0.3–1.2)

## 2014-10-04 LAB — CBC
HEMATOCRIT: 53.2 % — AB (ref 39.0–52.0)
HEMOGLOBIN: 16.3 g/dL (ref 13.0–17.0)
MCH: 29.4 pg (ref 26.0–34.0)
MCHC: 30.6 g/dL (ref 30.0–36.0)
MCV: 95.9 fL (ref 78.0–100.0)
Platelets: 158 10*3/uL (ref 150–400)
RBC: 5.55 MIL/uL (ref 4.22–5.81)
RDW: 13.2 % (ref 11.5–15.5)
WBC: 17.3 10*3/uL — ABNORMAL HIGH (ref 4.0–10.5)

## 2014-10-04 LAB — VANCOMYCIN, TROUGH: Vancomycin Tr: 11.8 ug/mL (ref 10.0–20.0)

## 2014-10-04 MED ORDER — DEXTROSE 5 % IV SOLN
2.0000 g | Freq: Three times a day (TID) | INTRAVENOUS | Status: DC
  Administered 2014-10-04 – 2014-10-12 (×24): 2 g via INTRAVENOUS
  Filled 2014-10-04 (×25): qty 2

## 2014-10-04 NOTE — Progress Notes (Signed)
Clinical Social Work Department BRIEF PSYCHOSOCIAL ASSESSMENT 10/04/2014  Patient:  Philip [name], Philip [name]     Account Number:  [id number]     Admit date:  09/24/2014  Clinical Social Worker:  Philip [name]  Date/Time:  10/04/2014 10:23 AM  Referred by:  Physician  Date Referred:  10/04/2014 Referred for  SNF Placement   Other Referral:   Interview type:  Patient Other interview type:   and patient sister via telephone    PSYCHOSOCIAL DATA Living Status:  FAMILY Admitted from facility:   Level of care:   Primary support name:  Philip [name]/sister/[phone] Primary support relationship to patient:  SIBLING Degree of support available:   strong    CURRENT CONCERNS Current Concerns  Post-Acute Placement   Other Concerns:    SOCIAL WORK ASSESSMENT / PLAN CSW received referral for New SNF. CSW reviewed chart and noted that pt now on 6 L O2.    CSW met with pt at bedside. CSW introduced self and explained role. Pt shared that he lives at home with his mother who has medical issues of her own. CSW discussed MD and PT recommendation for rehab at SNF following hospitalization and pt agreeable. CSW discussed process of SNF search. Pt shared that he is agreeable to [facility] search. CSW provided supportive listening as pt discussed that he has a niece that is a long term resident at [facility] and has had multiple family members at [facility]. Pt expressed that [facility] would be his preference. Pt shared that pt sister, Philip [name] assist him with his medical decisions and asked CSW to contact pt sister to discuss disposition planning.    CSW contacted pt sister, Philip [name] via telephone. CSW discussed recommendation for rehab for pt following hospitalization. Pt sister expressed understanding. Pt sister stated that she does not want [facility], [facility], or [facility] for pt. Pt sister shared that preference would be [facility] or [facility] as  pt family is familiar with facility. CSW expressed understanding and provided support.    CSW completed FL2 and initiated SNF search to [facility] excluding the facilities that pt sister was not interested in. CSW contacted [place] to notify of pt family interest in facility and [place] requested CSW to continue to send clinicals as pt progresses.    CSW to follow up with pt re: SNF bed offers    CSW to continue to follow to provide support and assist with pt discharge planning needs.   Assessment/plan status:  Psychosocial Support/Ongoing Assessment of Needs Other assessment/ plan:   discharge planning   Information/referral to community resources:   SNF list    PATIENT'S/FAMILY'S RESPONSE TO PLAN OF CARE: Pt alert and oriented x 4. Pt pleasant and actively engaged in conversation. Pt hopeful for [facility] as he has a family member at the facility and has had other family members go to [facility] for rehab. Pt sister is a strong support to pt and actively involved in pt care.    Philip [name], MSW, [place] [facility] [phone]

## 2014-10-04 NOTE — Progress Notes (Signed)
Pt does not require HFNC at this time. Pt is tolerating 6 LPM nasal cannula at this time. MD aware and RT will continue to monitor.

## 2014-10-04 NOTE — Progress Notes (Signed)
ANTIBIOTIC CONSULT NOTE - Follow Up  Pharmacy Consult for Vancomycin/Ceftazidime Indication: pneumonia  No Known Allergies  Patient Measurements: Height: 5\' 10"  (177.8 cm) Weight: 202 lb 9.6 oz (91.9 kg) IBW/kg (Calculated) : 73   Vital Signs: Temp: 97.9 F (36.6 C) (10/16 0800) Temp Source: Axillary (10/16 0800) BP: 172/75 mmHg (10/16 0800) Pulse Rate: 106 (10/16 0842) Intake/Output from previous day: 10/15 0701 - 10/16 0700 In: 2473.3 [I.V.:2073.3; IV Piggyback:400] Out: 1350 [Urine:1150; Stool:200] Intake/Output from this shift:    Labs:  Recent Labs  10/02/14 0325 10/03/14 0320 10/04/14 0320  WBC 18.7* 19.2* 17.3*  HGB 16.6 15.6 16.3  PLT 172 152 158  CREATININE 1.41* 1.67* 1.27   Estimated Creatinine Clearance: 62.6 ml/min (by C-G formula based on Cr of 1.27). No results found for this basename: VANCOTROUGH, VANCOPEAK, VANCORANDOM, GENTTROUGH, GENTPEAK, GENTRANDOM, TOBRATROUGH, TOBRAPEAK, TOBRARND, AMIKACINPEAK, AMIKACINTROU, AMIKACIN,  in the last 72 hours    Assessment: 89 yoM admitted 10/6 with SOB, cough, dizziness requiring intubation after failing BiPAP.  CXR on admission suggestive of PNA and patient has been on antibiotics for CAP and Enterococcus UTI.  Patient also positive for Rhinovirus.  FIO2 needs increased and patient felt worse 10/15.  MD concerned for worsening infection and broadened antibiotics for nosocomial organisms.  Pharmacy consulted to dose vancomycin and ceftazidime.  CXR shows persistent bibasilar atelectasis.  10/6 >> Ceftriaxone >>10/8 10/7 >> Azithromycin x 1 10/9 >> Levaquin >> 10/15 10/15 >> Vancomycin >> 10/15 >> Ceftazidime >>  Tmax: temps low WBC: remaining elevated (on steroids) Renal: SCr improved to 1.27, CrCl~70 ml/min (CG), ~55 ml/min (normalized)  Micro data: 10/6 blood cx x 2: NGF 10/7 respiratory virus panel: Rhinovirus + 10/7 urine cx: Enterococcus species (R- tetracycline, S-ampicillin, levaquin, NTF,  Vanc) 10/7 trach aspirate cx: oral flora 10/7 MRSA screen: negative  Today is day #2 Vancomycin 750 mg IV q12h and Ceftazidime 2g IV q12h for worsening pneumonia.  Day #11 total antibiotics (and day #8 total antibiotics for treatment of Enterococcus UTI).    Goal of Therapy:  Vancomycin trough level 15-20 mcg/ml Doses adjusted per renal function Eradication of infection  Plan:  1.  Check vancomycin trough tonight.  Will adjust dose per level. 2.  Adjust Ceftazidime to 2g IV q8h since SCr improved. 3.  F/u SCr, trough levels, clinical course.  Philip [name], Philip [name] 10/04/2014,9:50 AM

## 2014-10-04 NOTE — Progress Notes (Signed)
Occupational Therapy Treatment Patient Details Name: Philip [name] MRN: [id number] DOB: 08/07/1946 Today's Date: 10/04/2014    History of present illness [name] with SOB, cough, dizziness. In ED, CXR suggestive of PNA. He failed BiPAP and required intubation. Pt extubated 10/8 and still requiring oxygen   OT comments  Pt very motivated  Follow Up Recommendations  SNF    Equipment Recommendations  None recommended by OT       Precautions / Restrictions Precautions Precautions: Fall Precaution Comments: decreased sats and and increased RR Restrictions Weight Bearing Restrictions: No       Mobility Bed Mobility               General bed mobility comments: pt in chair  Transfers     Transfers: Sit to/from Stand Sit to Stand: Min assist                  ADL       Grooming: Standing;Wash/dry face Grooming Details (indicate cue type and reason): pt stood 3 times for 1-2 minutes each time. VC for deep breathing and slow breathing                               General ADL Comments: Pt in chair on 6L of oxygen.  Pts oxygen decreased to low 80's with standing and RR up to 40. RN aware. Pt wants to participate but is limited                   General Comments  decreased sats and RR limiting for pt at this time    Pertinent Vitals/ Pain       Pain Assessment: No/denies pain         Frequency Min 2X/week     Progress Toward Goals  OT Goals(current goals can now be found in the care plan section)  Progress towards OT goals: Progressing toward goals     Plan Discharge plan remains appropriate       End of Session     Activity Tolerance Patient tolerated treatment well   Patient Left in chair;with call bell/phone within reach;with chair alarm set   Nurse Communication Mobility status        Time: [id number] OT Time Calculation (min): 12 min  Charges: OT General Charges $OT Visit: 1  Procedure OT Treatments $Self Care/Home Management : 8-22 mins  Cyleigh Massaro D 10/04/2014, 11:46 AM

## 2014-10-04 NOTE — Progress Notes (Signed)
PULMONARY / CRITICAL CARE MEDICINE   Name: Frederica Kusterrthur L Degeorge MRN: [id number] DOB: 05/24/1946  PCP [name],[name], MD    ADMISSION DATE:  09/24/2014 CONSULTATION DATE:  10/04/2014  REFERRING MD :  EDP  CHIEF COMPLAINT:  SOB  INITIAL PRESENTATION: [name] with SOB, cough, dizziness.  In ED, CXR suggestive of PNA.  He failed BiPAP and required intubation, PCCM consulted for admission.  STUDIES:  CXR 10/6 >> vascular congestion, b/l pleural effusions, RLL atx vs infiltrate. TTE 10/7 >> EF 60-65%, mod concentric hypertrophy, doppler parameters c/w gd I diastolic dysfxn   SIGNIFICANT EVENTS: 10/6  presented to ED, started on BiPAP but failed, required intubation. 10/7  ETT repositioned with poor volume return on vent, to 25 cm at teeth 10/8. Passed SBT, fully awake. Extubated.  10/9 hypertensive. Very congested cough, but no distress.  10/11: desaturations overnight remains on bipap 10/12: patient placed on bipap for increased WOB/RR. Patient reports distress. Using biPAP throughout day.  10/13: patient used biPAP overnight 10/14: up in chair. Looking better. PT and OT suggesting SNF.  10/15 FIO2 needs increased. Feels worse.  10/16: persistent low volumes   SUBJECTIVE:   No sig clinical change. Feels about the same  VITAL SIGNS: Temp:  [96.5 F (35.8 C)-98.4 F (36.9 C)] 97.9 F (36.6 C) (10/16 0800) Pulse Rate:  [79-120] 106 (10/16 0842) Resp:  [16-36] 20 (10/16 0842) BP: (87-172)/(51-87) 172/75 mmHg (10/16 0800) SpO2:  [83 %-100 %] 97 % (10/16 0839) FiO2 (%):  [100 %] 100 % (10/15 2052) Weight:  [91.9 kg (202 lb 9.6 oz)] 91.9 kg (202 lb 9.6 oz) (10/16 0336) Vent Mode:  [-]  FiO2 (%):  [100 %] 100 % 6 liters INTAKE / OUTPUT: Intake/Output     10/15 0701 - 10/16 0700 10/16 0701 - 10/17 0700   I.V. (mL/kg) 2073.3 (22.6)    IV Piggyback 400    Total Intake(mL/kg) 2473.3 (26.9)    Urine (mL/kg/hr) 1150 (0.5)    Stool 200 (0.1)    Total Output 1350      Net +1123.3          Urine Occurrence 2 x      PHYSICAL EXAMINATION: General: adult male in NAD Neuro: awake, completely oriented. No focal def  HEENT: Brady/AT. PERRL, sclerae anicteric. Cardiovascular: RRR, no M/R/G. Cap refill brisk  Lungs: bibasilar posterior rales, decreased t/o. 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 Musculoskeletal: No gross deformities, no edema.  Skin: Intact, warm, no rashes.  LABS:  CBC  Recent Labs Lab 10/02/14 0325 10/03/14 0320 10/04/14 0320  WBC 18.7* 19.2* 17.3*  HGB 16.6 15.6 16.3  HCT 52.4* 51.3 53.2*  PLT 172 152 158   BMET  Recent Labs Lab 10/02/14 0325 10/03/14 0320 10/04/14 0320  NA 141 140 134*  K 5.2 5.4* 5.0  CL 99 99 95*  CO2 33* 35* 34*  BUN 58* 69* 51*  CREATININE 1.41* 1.67* 1.27  GLUCOSE 140* 133* 103*   Electrolytes  Recent Labs Lab 09/30/14 0336  10/02/14 0325 10/03/14 0320 10/04/14 0320  CALCIUM 9.3  < > 9.2 9.2 9.2  MG 2.7*  --   --   --   --   PHOS 4.7*  --   --   --   --   < > = values in this interval not displayed.  Sepsis Markers No results found for this basename:  LATICACIDVEN, PROCALCITON, O2SATVEN,  in the last 168 hours ABG No results found for this basename: PHART, PCO2ART, PO2ART,  in the last 168 hours Liver Enzymes  Recent Labs Lab 09/28/14 0336 10/04/14 0320  AST 32 21  ALT 40 32  ALKPHOS 66 53  BILITOT 0.3 0.4  ALBUMIN 3.2* 3.0*   Glucose  Recent Labs Lab 09/29/14 1154 09/29/14 1610 09/29/14 2356 09/30/14 0731 09/30/14 1137 10/02/14 0802  GLUCAP 145* 166* 151* 126* 137* 168*    Imaging Dg Chest Port 1 View  10/04/2014   CLINICAL DATA:  68 year old male admitted for pneumonia.  EXAM: PORTABLE CHEST - 1 VIEW  COMPARISON:  Multiple prior plain film. Most recent chest x-ray 10/02/2014  FINDINGS: Apical lordotic positioning.  Cardiomediastinal silhouette unchanged in size and contour.  Low lung volumes with persisting  bibasilar opacities, with blunting of the costophrenic angles.  No pneumothorax.  No displaced fracture.  IMPRESSION: Similar appearance of the chest x-ray with low inspiratory volume and bibasilar opacities, reflecting a combination of atelectasis, pleural fluid, and/or consolidation.  Signed,  Yvone NeuJaime S. Loreta AveWagner, DO  [facility]  [facility]   Electronically Signed   By: Gilmer MorJaime  Wagner D.O.   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 ? Mucous plugging, possibly element of effusion, but ATX the larger component .  P:   BiPAP at HS and PRN DuoNebs / PRN Albuterol. Flutter. Cont prednisone 20 mg  Continue brovana and budesonide  Added nasal hygiene rx  Mobilize  Look at chest w/ US   CARDIOVASCULAR A:  Hx HTN, HLD - BNP, lactate, cortisol, troponin wnl HTN P:  pravachol home meds.  Continue home norvasc, cardura, lotensin PRN hydralazine for BP >160  RENAL A:   Acute kidney injury with reduced GFR -->worse w/ mild hypernatremia  > per his PCP this is typical for him when he gets dehydrated w/ his colostomy. Looks better s/p IVFs  P:    Decrease IVFs to 75 BMP in AM.   GASTROINTESTINAL A:   GI prophylaxis Nutrition P:   diet as tolerated Colostomy care  HEMATOLOGIC A:   VTE Prophylaxis Polycythemia, likely secondary to respiratory disease P:  SCD's / Lovenox. CBC in AM.  INFECTIOUS A:   Leukocytosis CAP - left consolidation: rhinovirus w/ prob bacterial superinfection  Enterococcus UTI  P:   BCx2 10/7 >> neg  UCx 10/7 >> enterococcus (amp sens) RVP 10/7 >> + rhinovirus   Changed to oral levaquin 10/9>>>10/15 Vanc 10/15>>> ceftaz 10/15>>> Ck  PCT  ENDOCRINE A:   DM   P:   SSI  Hold outpatient metformin.  NEUROLOGIC A:   Acute metabolic encephalopathy-->resolved.  Deconditioning. P:   Physical therapy to assess  TODAY'S SUMMARY:  His respiratory status is about the same. Lung volumes worse. Didn't get much improvement w/ mucomyst. Will cont chest vest, broad spec abx, look at chest to eval effusion (but doubt it's sig), add PCT protocol and cont supportive care.   Anders SimmondsPete Babcock ACNP-BC [facility] Pulmonary/Critical Care Pager # [phone] OR # [phone] if no answer   Reviewed above, and examined.  He continues to need increased supplemental oxygen.  Bedside u/s shows consolidation/atelectasis.  Will continue bronchial hygiene, nebs, Abx.  Will get CT chest to further assess, and then determine if he might need bronchoscopy.  Coralyn Helling, MD [facility] Pulmonary/Critical Care 10/04/2014, 11:02 AM Pager:  [phone] After 3pm call: [phone]

## 2014-10-04 NOTE — Progress Notes (Signed)
Pt has order for Q4 while awake chest vest.  Pt asleep upon RT's arrival to room, so RT initiated CPT on bed instead of waking pt up to use Vest.  Pt sleeping soundly at this time, RT to monitor and assess as needed.

## 2014-10-05 ENCOUNTER — Inpatient Hospital Stay (HOSPITAL_COMMUNITY): Payer: Medicare Other

## 2014-10-05 DIAGNOSIS — N179 Acute kidney failure, unspecified: Secondary | ICD-10-CM

## 2014-10-05 LAB — BLOOD GAS, ARTERIAL
ACID-BASE EXCESS: 6.5 mmol/L — AB (ref 0.0–2.0)
BICARBONATE: 34.6 meq/L — AB (ref 20.0–24.0)
Drawn by: 27021
O2 Content: 6 L/min
O2 Saturation: 89 %
Patient temperature: 98.6
TCO2: 30 mmol/L (ref 0–100)
pCO2 arterial: 65.1 mmHg (ref 35.0–45.0)
pH, Arterial: 7.345 — ABNORMAL LOW (ref 7.350–7.450)
pO2, Arterial: 56.6 mmHg — ABNORMAL LOW (ref 80.0–100.0)

## 2014-10-05 LAB — COMPREHENSIVE METABOLIC PANEL
ALT: 29 U/L (ref 0–53)
ANION GAP: 8 (ref 5–15)
AST: 19 U/L (ref 0–37)
Albumin: 2.8 g/dL — ABNORMAL LOW (ref 3.5–5.2)
Alkaline Phosphatase: 47 U/L (ref 39–117)
BUN: 31 mg/dL — ABNORMAL HIGH (ref 6–23)
CO2: 35 meq/L — AB (ref 19–32)
CREATININE: 1.17 mg/dL (ref 0.50–1.35)
Calcium: 8.9 mg/dL (ref 8.4–10.5)
Chloride: 101 mEq/L (ref 96–112)
GFR calc Af Amer: 72 mL/min — ABNORMAL LOW (ref >90)
GFR, EST NON AFRICAN AMERICAN: 62 mL/min — AB (ref >90)
GLUCOSE: 129 mg/dL — AB (ref 70–99)
Potassium: 5.2 mEq/L (ref 3.7–5.3)
Sodium: 144 mEq/L (ref 137–147)
TOTAL PROTEIN: 6.3 g/dL (ref 6.0–8.3)
Total Bilirubin: 0.5 mg/dL (ref 0.3–1.2)

## 2014-10-05 LAB — GLUCOSE, CAPILLARY: GLUCOSE-CAPILLARY: 84 mg/dL (ref 70–99)

## 2014-10-05 LAB — CBC
HCT: 49.3 % (ref 39.0–52.0)
HEMOGLOBIN: 15.5 g/dL (ref 13.0–17.0)
MCH: 30 pg (ref 26.0–34.0)
MCHC: 31.4 g/dL (ref 30.0–36.0)
MCV: 95.5 fL (ref 78.0–100.0)
Platelets: 128 10*3/uL — ABNORMAL LOW (ref 150–400)
RBC: 5.16 MIL/uL (ref 4.22–5.81)
RDW: 13.1 % (ref 11.5–15.5)
WBC: 14.2 10*3/uL — ABNORMAL HIGH (ref 4.0–10.5)

## 2014-10-05 MED ORDER — SODIUM CHLORIDE 0.9 % IV SOLN
250.0000 mL | INTRAVENOUS | Status: DC
  Administered 2014-10-05: 250 mL via INTRAVENOUS
  Administered 2014-10-06 – 2014-10-07 (×2): 50 mL via INTRAVENOUS

## 2014-10-05 MED ORDER — METOCLOPRAMIDE HCL 5 MG/ML IJ SOLN: 5.0000 mg | Freq: Three times a day (TID) | INTRAMUSCULAR | Status: DC | PRN

## 2014-10-05 MED ORDER — SODIUM CHLORIDE 0.9 % IV SOLN
1250.0000 mg | Freq: Two times a day (BID) | INTRAVENOUS | Status: DC
  Administered 2014-10-05 – 2014-10-07 (×6): 1250 mg via INTRAVENOUS
  Filled 2014-10-05 (×8): qty 1250

## 2014-10-05 NOTE — Progress Notes (Signed)
Pt refusing to wear CPAP at this time, RT to monitor and assess as needed.

## 2014-10-05 NOTE — Progress Notes (Signed)
ANTIBIOTIC CONSULT NOTE - FOLLOW UP  Pharmacy Consult for Vancomycin Indication: pneumonia  No Known Allergies  Patient Measurements: Height: 5\' 10"  (177.8 cm) Weight: 203 lb 7.8 oz (92.3 kg) IBW/kg (Calculated) : 73 Adjusted Body Weight:   Vital Signs: Temp: 98 F (36.7 C) (10/17 0400) Temp Source: Axillary (10/17 0400) BP: 179/84 mmHg (10/17 0400) Pulse Rate: 89 (10/17 0400) Intake/Output from previous day: 10/16 0701 - 10/17 0700 In: 4098 [P.O.:720; I.V.:2200; IV Piggyback:400] Out: 2550 [Urine:2200; Stool:350] Intake/Output from this shift: Total I/O In: 1200 [I.V.:1000; IV Piggyback:200] Out: 650 [Urine:650]  Labs:  Recent Labs  10/03/14 0320 10/04/14 0320 10/05/14 0355  WBC 19.2* 17.3* 14.2*  HGB 15.6 16.3 15.5  PLT 152 158 128*  CREATININE 1.67* 1.27 1.17   Estimated Creatinine Clearance: 68 ml/min (by C-G formula based on Cr of 1.17).  Recent Labs  10/04/14 2138  [place] 11.8     Microbiology: Recent Results (from the past 720 hour(s))  CULTURE, BLOOD (ROUTINE X 2)     Status: None   Collection Time    09/24/14 10:14 PM      Result Value Ref Range Status   Specimen Description BLOOD LEFT LAT WRIST   Final   Special Requests BOTTLES DRAWN AEROBIC AND ANAEROBIC 5CC   Final   Culture  Setup Time     Final   Value: 09/25/2014 01:54     Performed at [facility]   Culture     Final   Value: NO GROWTH 5 DAYS     Performed at [facility]   Report Status 10/01/2014 FINAL   Final  CULTURE, BLOOD (ROUTINE X 2)     Status: None   Collection Time    09/24/14 10:19 PM      Result Value Ref Range Status   Specimen Description BLOOD RIGHT WRIST   Final   Special Requests BOTTLES DRAWN AEROBIC AND ANAEROBIC 4CC   Final   Culture  Setup Time     Final   Value: 09/25/2014 01:54     Performed at [facility]   Culture     Final   Value: NO GROWTH 5 DAYS     Performed at [facility]   Report Status 10/01/2014 FINAL    Final  URINE CULTURE     Status: None   Collection Time    09/25/14  1:07 AM      Result Value Ref Range Status   Specimen Description URINE, CATHETERIZED   Final   Special Requests Normal   Final   Culture  Setup Time     Final   Value: 09/25/2014 06:24     Performed at [facility] Count     Final   Value: 15,000 COLONIES/ML     Performed at [facility]   Culture     Final   Value: ENTEROCOCCUS SPECIES     Performed at [facility]   Report Status 09/27/2014 FINAL   Final   Organism ID, Bacteria ENTEROCOCCUS SPECIES   Final  RESPIRATORY VIRUS PANEL     Status: Abnormal   Collection Time    09/25/14  3:30 AM      Result Value Ref Range Status   Source - RVPAN NOSE   Final   Respiratory Syncytial Virus A NOT DETECTED   Final   Respiratory Syncytial Virus B NOT DETECTED   Final   Influenza A NOT DETECTED  Final   Influenza B NOT DETECTED   Final   Parainfluenza 1 NOT DETECTED   Final   Parainfluenza 2 NOT DETECTED   Final   Parainfluenza 3 NOT DETECTED   Final   Metapneumovirus NOT DETECTED   Final   Rhinovirus DETECTED (*)  Final   Adenovirus NOT DETECTED   Final   Influenza A H1 NOT DETECTED   Final   Influenza A H3 NOT DETECTED   Final   Comment: (NOTE)           Normal Reference Range for each Analyte: NOT DETECTED     Testing performed using the Luminex xTAG Respiratory Viral Panel test     kit.     The analytical performance characteristics of this assay have been     determined by [facility].  The modifications have not been     cleared or approved by the FDA. This assay has been validated pursuant     to the CLIA regulations and is used for clinical purposes.     Performed at [place] PCR SCREENING     Status: None   Collection Time    09/25/14  3:32 AM      Result Value Ref Range Status   MRSA by PCR NEGATIVE  NEGATIVE Final   Comment:            The GeneXpert MRSA Assay (FDA     approved for  NASAL specimens     only), is one component of a     comprehensive MRSA colonization     surveillance program. It is not     intended to diagnose MRSA     infection nor to guide or     monitor treatment for     MRSA infections.  CULTURE, RESPIRATORY (NON-EXPECTORATED)     Status: None   Collection Time    09/25/14  4:20 AM      Result Value Ref Range Status   Specimen Description TRACHEAL ASPIRATE   Final   Special Requests Normal   Final   Gram Stain     Final   Value: ABUNDANT WBC PRESENT,BOTH PMN AND MONONUCLEAR     RARE SQUAMOUS EPITHELIAL CELLS PRESENT     NO ORGANISMS SEEN     Performed at [facility]   Culture     Final   Value: Non-Pathogenic Oropharyngeal-type Flora Isolated.     Performed at [facility]   Report Status 09/27/2014 FINAL   Final    Anti-infectives   Start     Dose/Rate Route Frequency Ordered Stop   10/05/14 0800  vancomycin (VANCOCIN) 1,250 mg in sodium chloride 0.9 % 250 mL IVPB     1,250 mg 166.7 mL/hr over 90 Minutes Intravenous Every 12 hours 10/05/14 0602     10/04/14 1000  cefTAZidime (FORTAZ) 2 g in dextrose 5 % 50 mL IVPB     2 g 100 mL/hr over 30 Minutes Intravenous Every 8 hours 10/04/14 0947     10/03/14 1200  cefTAZidime (FORTAZ) 2 g in dextrose 5 % 50 mL IVPB  Status:  Discontinued     2 g 100 mL/hr over 30 Minutes Intravenous Every 12 hours 10/03/14 0930 10/04/14 0947   10/03/14 1000  vancomycin (VANCOCIN) IVPB 750 mg/150 ml premix  Status:  Discontinued     750 mg 150 mL/hr over 60 Minutes Intravenous Every 12 hours 10/03/14 0928 10/05/14 0602   09/27/14  1000  levofloxacin (LEVAQUIN) tablet 750 mg     750 mg Oral Daily 09/27/14 0939 10/03/14 0915   09/25/14 2200  azithromycin (ZITHROMAX) 500 mg in dextrose 5 % 250 mL IVPB  Status:  Discontinued     500 mg 250 mL/hr over 60 Minutes Intravenous Every 24 hours 09/25/14 0121 09/26/14 0933   09/25/14 2200  cefTRIAXone (ROCEPHIN) 2 g in dextrose 5 % 50 mL IVPB  Status:   Discontinued     2 g 100 mL/hr over 30 Minutes Intravenous Every 24 hours 09/25/14 0121 09/27/14 0939   09/24/14 2330  cefTRIAXone (ROCEPHIN) 2 g in dextrose 5 % 50 mL IVPB     2 g 100 mL/hr over 30 Minutes Intravenous  Once 09/24/14 2316 09/25/14 0001   09/24/14 2330  azithromycin (ZITHROMAX) 500 mg in dextrose 5 % 250 mL IVPB     500 mg 250 mL/hr over 60 Minutes Intravenous  Once 09/24/14 2316 09/25/14 0155      Assessment: Patient with low vancomycin level.  Prior doses charted correctly.  Goal of Therapy:  Vancomycin trough level 15-20 mcg/ml  Plan:  Measure antibiotic drug levels at steady state Follow up culture results Change to vancomycin $RemoveBefor'1250mg'[id number]$  iv q12hr  Nani Skillern [place] 10/05/2014,6:03 AM

## 2014-10-05 NOTE — Progress Notes (Signed)
Pt asleep at this time, RT opted to do CPT on bed instead of waking Pt up to do chest vest.  Pt tolerating well at this time, RT to monitor and assess as needed.

## 2014-10-05 NOTE — Progress Notes (Signed)
PULMONARY / CRITICAL CARE MEDICINE   Name: Philip [name] DOB: 06/03/1946  PCP Philip [name]    ADMISSION DATE:  09/24/2014 CONSULTATION DATE:  10/05/2014  REFERRING [name] :  EDP  CHIEF COMPLAINT:  SOB  INITIAL PRESENTATION: [name] [facility] with SOB, cough, dizziness.  In ED, CXR suggestive of PNA.  He failed BiPAP and required intubation, PCCM consulted for admission.  STUDIES:  CXR 10/6 >> vascular congestion, b/l pleural effusions, RLL atx vs infiltrate. TTE 10/7 >> EF 60-65%, mod concentric hypertrophy, doppler parameters c/w gd I diastolic dysfxn   SIGNIFICANT EVENTS: 10/6  presented to ED, started on BiPAP but failed, required intubation. 10/7  ETT repositioned with poor volume return on vent, to 25 cm at teeth 10/8. Passed SBT, fully awake. Extubated.  10/9 hypertensive. Very congested cough, but no distress.  10/11: desaturations overnight remains on bipap 10/12: patient placed on bipap for increased WOB/RR. Patient reports distress. Using biPAP throughout day.  10/13: patient used biPAP overnight 10/14: up in chair. Looking better. PT and OT suggesting SNF.  10/15 FIO2 needs increased. Feels worse.  10/16: persistent low volumes  10/04/14 No sig clinical change. Feels about the same     SUBJECTIVE/OVERNIGHT/INTERVAL HX 10/05/14: Per  RN: refusing meds and thismight be a change. Was in chair yesterday but today in bed and? A bit more sleepy Denies any complaints. But was seen retching for several mintues   VITAL SIGNS: Temp:  [96.9 F (36.1 C)-98.1 F (36.7 C)] 98 F (36.7 C) (10/17 0400) Pulse Rate:  [84-112] 112 (10/17 0938) Resp:  [20-40] 25 (10/17 0600) BP: (125-184)/(56-121) 184/65 mmHg (10/17 0610) SpO2:  [84 %-100 %] 95 % (10/17 0811) Weight:  [92.3 kg (203 lb 7.8 oz)] 92.3 kg (203 lb 7.8 oz) (10/17 0400)   6 liters INTAKE / OUTPUT: Intake/Output     10/16 0701 - 10/17 0700 10/17 0701 - 10/18 0700   P.O. 720     I.V. (mL/kg) 2200 (23.8)    IV Piggyback 400    Total Intake(mL/kg) 3320 (36)    Urine (mL/kg/hr) 2200 (1)    Stool 350 (0.2)    Total Output 2550     Net +770            PHYSICAL EXAMINATION: General: adult male in NAD Neuro: awake, completely oriented. No focal def  HEENT: [place]/AT. PERRL, sclerae anicteric. Cardiovascular: RRR, no M/R/G. Cap refill brisk  Lungs: bibasilar posterior rales, decreased t/o. + coarse rhonchi w/ cough , tachypneic but not paradoxical Abdomen: BS x 4, soft, NT/ND, colostomy bag in place. Musculoskeletal: No gross deformities, no edema.  Skin: Intact, warm, no rashes.  LABS: PULMONARY No results found for this basename: PHART, PCO2, PCO2ART, PO2, PO2ART, HCO3, TCO2, O2SAT,  in the last 168 hours  CBC  Recent Labs Lab 10/03/14 0320 10/04/14 0320 10/05/14 0355  HGB 15.6 16.3 15.5  HCT 51.3 53.2* 49.3  WBC 19.2* 17.3* 14.2*  PLT 152 158 128*    COAGULATION No results found for this basename: INR,  in the last 168 hours  CARDIAC  No results found for this basename: TROPONINI,  in the last 168 hours No results found for this basename: PROBNP,  in the last 168 hours   CHEMISTRY  Recent Labs Lab 09/30/14 0336 10/01/14 0326 10/02/14 0325 10/03/14 0320 10/04/14 0320 10/05/14 0355  NA 144 138 141 140 134* 144  K 4.3 4.6 5.2 5.4* 5.0  5.2  CL 100 95* 99 99 95* 101  CO2 36* 34* 33* 35* 34* 35*  GLUCOSE 142* 132* 140* 133* 103* 129*  BUN 61* 59* 58* 69* 51* 31*  CREATININE 1.53* 1.34 1.41* 1.67* 1.27 1.17  CALCIUM 9.3 9.2 9.2 9.2 9.2 8.9  MG 2.7*  --   --   --   --   --   PHOS 4.7*  --   --   --   --   --    Estimated Creatinine Clearance: 68 ml/min (by C-G formula based on Cr of 1.17).   LIVER  Recent Labs Lab 10/04/14 0320 10/05/14 0355  AST 21 19  ALT 32 29  ALKPHOS 53 47  BILITOT 0.4 0.5  PROT 6.8 6.3  ALBUMIN 3.0* 2.8*     INFECTIOUS No results found for this basename: LATICACIDVEN, PROCALCITON,  in the last 168  hours   ENDOCRINE CBG (last 3)   Recent Labs  10/04/14 0743  GLUCAP 84         IMAGING x48h Ct Chest Wo Contrast  10/04/2014   CLINICAL DATA:  Non resolving atelectasis, pneumonia. Subsequent encounter.  EXAM: CT CHEST WITHOUT CONTRAST  TECHNIQUE: Multidetector CT imaging of the chest was performed following the standard protocol without IV contrast.  COMPARISON:  06/01/2007  FINDINGS: THORACIC INLET/BODY WALL:  No acute abnormality.  MEDIASTINUM:  No definitive cardiomegaly. Atherosclerosis, including diffuse involvement of the coronary arteries, both the left and right circulation. There is no evidence of acute vascular abnormality. The esophagus is negative. No lymphadenopathy.  LUNG WINDOWS:  Complete opacification of the right lower lobe and near complete opacification of the left lower lobe, sparing the superior segment. There is marked volume loss and bronchial crowding, consistent with atelectasis. Mild atelectasis also present in the right middle lobe and lingula. There is opacification of the right bronchus intermedius and lower lobe bronchus. Flat level consistent with fluid level/debris. Repeat examination after clearing, preferably with contrast, would be required to exclude an underlying mass lesion. There is some narrowing and distortion of left lower lobe bronchi, but no discrete filling defect. The trachea is flattened, as are the mainstem bronchi, consistent with tracheomalacia. This is a recurrent problem based on 2008 chest CT, which has very similar findings.  UPPER ABDOMEN:  No acute findings.  OSSEOUS:  Ankylosis of the mid and lower thoracic spine with fairly bulky osteophytes favoring diffuse idiopathic skeletal hyperostosis.  IMPRESSION: Tracheobronchomalacia and endobronchial debris with near complete bilateral lower lobe collapse. Note that similar findings present on chest CT 06/01/2007. If bronchoscopy is not planned, suggest repeat CT after convalescence to exclude  a mass lesion.   Electronically Signed   By: Philip [name] M.D.   On: 10/04/2014 21:19   Dg Chest Port 1 View  10/05/2014   CLINICAL DATA:  Atelectasis, hypertension  EXAM: PORTABLE CHEST - 1 VIEW  COMPARISON:  Chest CT 10/04/2014  FINDINGS: Normal mediastinum with enlarged cardiac silhouette. There are low lung volumes. There is left basilar opacity. Upper lungs are clear.  IMPRESSION: 1. No interval change. 2. Dense bibasilar atelectasis and low lung volumes.   Electronically Signed   By: Genevive Bi M.D.   On: 10/05/2014 07:28   Dg Chest Port 1 View  10/04/2014   CLINICAL DATA:  68 year old male admitted for pneumonia.  EXAM: PORTABLE CHEST - 1 VIEW  COMPARISON:  Multiple prior plain film. Most recent chest x-ray 10/02/2014  FINDINGS: Apical lordotic positioning.  Cardiomediastinal silhouette unchanged  in size and contour.  Low lung volumes with persisting bibasilar opacities, with blunting of the costophrenic angles.  No pneumothorax.  No displaced fracture.  IMPRESSION: Similar appearance of the chest x-ray with low inspiratory volume and bibasilar opacities, reflecting a combination of atelectasis, pleural fluid, and/or consolidation.  Signed,  Yvone NeuJaime S. Loreta AveWagner, DO  [facility]  [facility]   Electronically Signed   By: Gilmer MorJaime  Wagner D.O.   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 ? Mucous plugging, possibly element of effusion, but ATX the larger component .    - some resp distress 10/05/14 and might be at risk for reintubation  P:   ABG stat,  BiPAP at HS and PRN Low threshold to tinbuated DuoNebs / PRN Albuterol. Flutter. Cont prednisone 20 mg  Continue brovana and budesonide   Added nasal hygiene rx  Mobilize  Look at chest w/ US   CARDIOVASCULAR A:  Hx HTN, HLD - BNP, lactate, cortisol, troponin wnl HTN P:  pravachol home meds.  Continue home norvasc, cardura, lotensin PRN hydralazine for BP >160  RENAL A:   Acute kidney injury with reduced GFR -->worse w/ mild hypernatremia  > per his PCP this is typical for him when he gets dehydrated w/ his colostomy. Looks better s/p IVFs     - improved creat 10/05/14 P:     IVFs to  50cc/hj BMP in AM.   GASTROINTESTINAL A:   GI prophylaxis Nutrition   - new retching noticed 10/05/14 P:   reglan prn NPO except mds Colostomy care  HEMATOLOGIC A:   VTE Prophylaxis Polycythemia, likely secondary to respiratory disease P:  SCD's / Lovenox. CBC in AM.  INFECTIOUS A:   Leukocytosis CAP - left consolidation: rhinovirus w/ prob bacterial superinfection  Enterococcus UTI  P:   BCx2 10/7 >> neg  UCx 10/7 >> enterococcus (amp sens) RVP 10/7 >> + rhinovirus   Changed to oral levaquin 10/9>>>10/15 Vanc 10/15>>> ceftaz 10/15>>> Ck PCT  ENDOCRINE A:   DM   P:   SSI  Hold outpatient metformin.  NEUROLOGIC A:   Acute metabolic encephalopathy-->resolved.  Deconditioning.   - might be getting encephalopathich 10/05/14   P:   Physical therapy to assess Monitor closely  FAMILY Updates:  None at bedside 10/05/2014    TODAY'S SUMMARY:  He might be worse with mental and resp status. Make him NPO.Check abg.Monitor closely for reintubation  The patient is critically ill with multiple organ systems failure and requires high complexity decision making for assessment and support, frequent evaluation and titration of therapies, application of advanced monitoring technologies and extensive interpretation of multiple databases.   Critical Care Time devoted to patient care services described in this note is  30  Minutes. This time reflects time of care of this signee Dr Kalman ShanMurali Shanekia Latella. This  critical care time does not reflect procedure time, or teaching time or supervisory time of PA/NP/Med student/Med Resident etc but could involve care discussion time    Dr. Kalman ShanMurali Lonny Eisen, M.D., [facility].C.P Pulmonary and Critical Care Medicine Staff Physician [facility] [place] [facility] [place] [facility] Pager: [phone], If no answer or between  15:00h - 7:00h: call [phone]  10/05/2014 11:49 AM

## 2014-10-06 LAB — PHOSPHORUS: Phosphorus: 2.5 mg/dL (ref 2.3–4.6)

## 2014-10-06 LAB — BLOOD GAS, ARTERIAL
Acid-Base Excess: 7.7 mmol/L — ABNORMAL HIGH (ref 0.0–2.0)
BICARBONATE: 34.3 meq/L — AB (ref 20.0–24.0)
DELIVERY SYSTEMS: POSITIVE
Drawn by: 270211
EXPIRATORY PAP: 6
FIO2: 0.3 %
Inspiratory PAP: 16
MODE: POSITIVE
O2 Saturation: 90.2 %
PATIENT TEMPERATURE: 37
TCO2: 29.7 mmol/L (ref 0–100)
pCO2 arterial: 57.3 mmHg (ref 35.0–45.0)
pH, Arterial: 7.395 (ref 7.350–7.450)
pO2, Arterial: 58 mmHg — ABNORMAL LOW (ref 80.0–100.0)

## 2014-10-06 LAB — MAGNESIUM: Magnesium: 1.8 mg/dL (ref 1.5–2.5)

## 2014-10-06 MED ORDER — MAGNESIUM SULFATE 40 MG/ML IJ SOLN
2.0000 g | Freq: Once | INTRAMUSCULAR | Status: AC
  Administered 2014-10-06: 2 g via INTRAVENOUS
  Filled 2014-10-06: qty 50

## 2014-10-06 NOTE — Progress Notes (Signed)
PULMONARY / CRITICAL CARE MEDICINE   Name: Philip [name] MRN: [id number] DOB: 1946/06/10  PCP Ezequiel Kayser, MD    ADMISSION DATE:  09/24/2014 CONSULTATION DATE:  10/06/2014  REFERRING MD :  EDP  CHIEF COMPLAINT:  SOB  INITIAL PRESENTATION: [name] brought to [facility] ED on 10/6 with SOB, cough, dizziness.  In ED, CXR suggestive of PNA.  He failed BiPAP and required intubation, PCCM consulted for admission.  STUDIES:  CXR 10/6 >> vascular congestion, b/l pleural effusions, RLL atx vs infiltrate. TTE 10/7 >> EF 60-65%, mod concentric hypertrophy, doppler parameters c/w gd I diastolic dysfxn   SIGNIFICANT EVENTS: 10/6  presented to ED, started on BiPAP but failed, required intubation. 10/7  ETT repositioned with poor volume return on vent, to 25 cm at teeth 10/8. Passed SBT, fully awake. Extubated.  10/9 hypertensive. Very congested cough, but no distress.  10/11: desaturations overnight remains on bipap 10/12: patient placed on bipap for increased WOB/RR. Patient reports distress. Using biPAP throughout day.  10/13: patient used biPAP overnight 10/14: up in chair. Looking better. PT and OT suggesting SNF.  10/15 FIO2 needs increased. Feels worse.  10/16: persistent low volumes  10/04/14 No sig clinical change. Feels about the same   10/05/14: Per  RN: refusing meds and thismight be a change. Was in chair yesterday but today in bed and? A bit more sleepy Denies any complaints. But was seen retching for several mintues    SUBJECTIVE/OVERNIGHT/INTERVAL HX 10/06/14: RN says he is now needing continuous bipap; otherwise desats and gets very dyspneic   VITAL SIGNS: Temp:  [97.5 F (36.4 C)-98.2 F (36.8 C)] 98.1 F (36.7 C) (10/18 0739) Pulse Rate:  [47-111] 89 (10/18 1000) Resp:  [14-35] 19 (10/18 1000) BP: (89-199)/(51-105) 162/85 mmHg (10/18 1100) SpO2:  [93 %-100 %] 100 % (10/18 1000) FiO2 (%):  [30 %-60 %] 30 % (10/18 0738) Weight:  [91 kg (200 lb 9.9 oz)] 91 kg (200 lb 9.9  oz) (10/18 0400) Vent Mode:  [-]  FiO2 (%):  [30 %-60 %] 30 % 6 liters INTAKE / OUTPUT: Intake/Output     10/17 0701 - 10/18 0700 10/18 0701 - 10/19 0700   P.O.     I.V. (mL/kg) 750 (8.2) 150 (1.6)   IV Piggyback 650 250   Total Intake(mL/kg) 1400 (15.4) 400 (4.4)   Urine (mL/kg/hr) 2000 (0.9)    Stool 650 (0.3)    Total Output 2650     Net -1250 +400          PHYSICAL EXAMINATION: General: adult male in NAD Neuro: awake, completely oriented. No focal def  HEENT: [place]/AT. PERRL, sclerae anicteric. BIPAP on Cardiovascular: RRR, no M/R/G. Cap refill brisk  Lungs: bibasilar posterior rales, decreased t/o., tachypneic but not paradoxical Abdomen: BS x 4, soft, NT/ND, colostomy bag in place. Musculoskeletal: No gross deformities, no edema.  Skin: Intact, warm, no rashes.  LABS: PULMONARY  Recent Labs Lab 10/05/14 1228  PHART 7.345*  PCO2ART 65.1*  PO2ART 56.6*  HCO3 34.6*  TCO2 30.0  O2SAT 89.0    CBC  Recent Labs Lab 10/03/14 0320 10/04/14 0320 10/05/14 0355  HGB 15.6 16.3 15.5  HCT 51.3 53.2* 49.3  WBC 19.2* 17.3* 14.2*  PLT 152 158 128*    COAGULATION No results found for this basename: INR,  in the last 168 hours  CARDIAC  No results found for this basename: TROPONINI,  in the last 168 hours No results found for this basename: PROBNP,  in the last 168 hours   CHEMISTRY  Recent Labs Lab 09/30/14 0336 10/01/14 0326 10/02/14 0325 10/03/14 0320 10/04/14 0320 10/05/14 0355 10/06/14 0420  NA 144 138 141 140 134* 144  --   K 4.3 4.6 5.2 5.4* 5.0 5.2  --   CL 100 95* 99 99 95* 101  --   CO2 36* 34* 33* 35* 34* 35*  --   GLUCOSE 142* 132* 140* 133* 103* 129*  --   BUN 61* 59* 58* 69* 51* 31*  --   CREATININE 1.53* 1.34 1.41* 1.67* 1.27 1.17  --   CALCIUM 9.3 9.2 9.2 9.2 9.2 8.9  --   MG 2.7*  --   --   --   --   --  1.8  PHOS 4.7*  --   --   --   --   --  2.5   Estimated Creatinine Clearance: 67.6 ml/min (by C-G formula based on Cr of  1.17).   LIVER  Recent Labs Lab 10/04/14 0320 10/05/14 0355  AST 21 19  ALT 32 29  ALKPHOS 53 47  BILITOT 0.4 0.5  PROT 6.8 6.3  ALBUMIN 3.0* 2.8*     INFECTIOUS No results found for this basename: LATICACIDVEN, PROCALCITON,  in the last 168 hours   ENDOCRINE CBG (last 3)   Recent Labs  10/04/14 0743  GLUCAP 84         IMAGING x48h Ct Chest Wo Contrast  10/04/2014   CLINICAL DATA:  Non resolving atelectasis, pneumonia. Subsequent encounter.  EXAM: CT CHEST WITHOUT CONTRAST  TECHNIQUE: Multidetector CT imaging of the chest was performed following the standard protocol without IV contrast.  COMPARISON:  06/01/2007  FINDINGS: THORACIC INLET/BODY WALL:  No acute abnormality.  MEDIASTINUM:  No definitive cardiomegaly. Atherosclerosis, including diffuse involvement of the coronary arteries, both the left and right circulation. There is no evidence of acute vascular abnormality. The esophagus is negative. No lymphadenopathy.  LUNG WINDOWS:  Complete opacification of the right lower lobe and near complete opacification of the left lower lobe, sparing the superior segment. There is marked volume loss and bronchial crowding, consistent with atelectasis. Mild atelectasis also present in the right middle lobe and lingula. There is opacification of the right bronchus intermedius and lower lobe bronchus. Flat level consistent with fluid level/debris. Repeat examination after clearing, preferably with contrast, would be required to exclude an underlying mass lesion. There is some narrowing and distortion of left lower lobe bronchi, but no discrete filling defect. The trachea is flattened, as are the mainstem bronchi, consistent with tracheomalacia. This is a recurrent problem based on 2008 chest CT, which has very similar findings.  UPPER ABDOMEN:  No acute findings.  OSSEOUS:  Ankylosis of the mid and lower thoracic spine with fairly bulky osteophytes favoring diffuse idiopathic skeletal  hyperostosis.  IMPRESSION: Tracheobronchomalacia and endobronchial debris with near complete bilateral lower lobe collapse. Note that similar findings present on chest CT 06/01/2007. If bronchoscopy is not planned, suggest repeat CT after convalescence to exclude a mass lesion.   Electronically Signed   By: Tiburcio PeaJonathan  Watts M.D.   On: 10/04/2014 21:19   Dg Chest Port 1 View  10/05/2014   CLINICAL DATA:  Atelectasis, hypertension  EXAM: PORTABLE CHEST - 1 VIEW  COMPARISON:  Chest CT 10/04/2014  FINDINGS: Normal mediastinum with enlarged cardiac silhouette. There are low lung volumes. There is left basilar opacity. Upper lungs are clear.  IMPRESSION: 1. No interval change. 2. Dense bibasilar atelectasis  and low lung volumes.   Electronically Signed   By: Genevive BiStewart  Edmunds M.D.   On: 10/05/2014 07:28       ASSESSMENT / PLAN:  PULMONARY OETT 10/7 >>> 10/08 A: Acute on chronic hypoxemic and hypercarbic respiratory failure in setting of CAP + AECOPD Rhinovirus + OSA  Persistent basilar atx Small Left Pleural Effusion  Tobacco abuse> quit 2010 Progressive ATX.Marland Kitchen.. ? Mucous plugging, possibly element of effusion, but ATX the larger component .    - 10/06/14: needing near continuous bipap x 24h  P:   ABG stat,  BiPAP continuous Low threshold to tinbuated DuoNebs / PRN Albuterol. Flutter. Cont prednisone 20 mg  Continue brovana and budesonide  Added nasal hygiene rx  Mobilize  DC coreg  CARDIOVASCULAR A:  Hx HTN, HLD - BNP, lactate, cortisol, troponin wnl HTN   - normal bp/hr P:  pravachol home meds.  Continue home norvasc, cardura, lotensin PRN hydralazine for BP >160 DC coreg in view of resp issues   RENAL A:   Acute kidney injury with reduced GFR -->worse w/ mild hypernatremia  > per his PCP this is typical for him when he gets dehydrated w/ his colostomy. Looks better s/p IVFs     - improved creat 10/05/14 P:     IVFs to  50cc/hj BMP in AM.   GASTROINTESTINAL A:    GI prophylaxis Nutrition   - new retching noticed 10/05/14 but mild low mag 10/06/14 P:   Replete mag reglan prn NPO except mds Colostomy care  HEMATOLOGIC A:   VTE Prophylaxis Polycythemia, likely secondary to respiratory disease P:  SCD's / Lovenox. CBC in AM.  INFECTIOUS A:   Leukocytosis CAP - left consolidation: rhinovirus w/ prob bacterial superinfection  Enterococcus UTI  P:   BCx2 10/7 >> neg  UCx 10/7 >> enterococcus (amp sens) RVP 10/7 >> + rhinovirus   Changed to oral levaquin 10/9>>>10/15 Vanc 10/15>>> ceftaz 10/15>>>  ENDOCRINE A:   DM   P:   SSI  Hold outpatient metformin.  NEUROLOGIC A:   Acute metabolic encephalopathy-->resolved.  Deconditioning.      - confusion resolved 10/06/14 after going on bipap 10/05/14  P:   Physical therapy to assess Monitor closely  FAMILY Updates:  None at bedside 10/06/2014 but sister and DPOA Blinda LeatherwoodShirley Davis [phone] updated over phone. Explained guarded prognosis and high risk for reintubation  IDT family meet - LOS 12 days and he will need a detailed family meet this upcoming week   TODAY'S SUMMARY:  Monitor closely for reintubation; high risk  The patient is critically ill with multiple organ systems failure and requires high complexity decision making for assessment and support, frequent evaluation and titration of therapies, application of advanced monitoring technologies and extensive interpretation of multiple databases.   Critical Care Time devoted to patient care services described in this note is  30  Minutes. This time reflects time of care of this signee Dr Kalman ShanMurali Keysean Savino. This critical care time does not reflect procedure time, or teaching time or supervisory time of PA/NP/Med student/Med Resident etc but could involve care discussion time    Dr. Kalman ShanMurali Raelan Burgoon, M.D., [facility].P Pulmonary and Critical Care Medicine Staff Physician [place] [facility] [place] [facility] and Critical  Care Pager: [phone], If no answer or between  15:00h - 7:00h: call [phone]  10/06/2014 12:10 PM

## 2014-10-07 ENCOUNTER — Inpatient Hospital Stay (HOSPITAL_COMMUNITY): Payer: Medicare Other

## 2014-10-07 DIAGNOSIS — R0602 Shortness of breath: Secondary | ICD-10-CM

## 2014-10-07 DIAGNOSIS — Z515 Encounter for palliative care: Secondary | ICD-10-CM

## 2014-10-07 DIAGNOSIS — R5381 Other malaise: Secondary | ICD-10-CM

## 2014-10-07 LAB — CBC WITH DIFFERENTIAL/PLATELET
BASOS ABS: 0 10*3/uL (ref 0.0–0.1)
Basophils Relative: 0 % (ref 0–1)
EOS PCT: 1 % (ref 0–5)
Eosinophils Absolute: 0.1 10*3/uL (ref 0.0–0.7)
HEMATOCRIT: 46.2 % (ref 39.0–52.0)
Hemoglobin: 14.9 g/dL (ref 13.0–17.0)
LYMPHS ABS: 0.8 10*3/uL (ref 0.7–4.0)
Lymphocytes Relative: 9 % — ABNORMAL LOW (ref 12–46)
MCH: 30 pg (ref 26.0–34.0)
MCHC: 32.3 g/dL (ref 30.0–36.0)
MCV: 93.1 fL (ref 78.0–100.0)
MONO ABS: 0.9 10*3/uL (ref 0.1–1.0)
Monocytes Relative: 9 % (ref 3–12)
Neutro Abs: 7.6 10*3/uL (ref 1.7–7.7)
Neutrophils Relative %: 81 % — ABNORMAL HIGH (ref 43–77)
Platelets: UNDETERMINED 10*3/uL (ref 150–400)
RBC: 4.96 MIL/uL (ref 4.22–5.81)
RDW: 13.6 % (ref 11.5–15.5)
WBC: 9.4 10*3/uL (ref 4.0–10.5)

## 2014-10-07 LAB — PRO B NATRIURETIC PEPTIDE: Pro B Natriuretic peptide (BNP): 314.5 pg/mL — ABNORMAL HIGH (ref 0–125)

## 2014-10-07 LAB — BASIC METABOLIC PANEL
Anion gap: 10 (ref 5–15)
BUN: 22 mg/dL (ref 6–23)
CALCIUM: 8.8 mg/dL (ref 8.4–10.5)
CO2: 31 meq/L (ref 19–32)
CREATININE: 0.95 mg/dL (ref 0.50–1.35)
Chloride: 95 mEq/L — ABNORMAL LOW (ref 96–112)
GFR calc Af Amer: >90 mL/min (ref >90)
GFR calc non Af Amer: 83 mL/min — ABNORMAL LOW (ref >90)
GLUCOSE: 67 mg/dL — AB (ref 70–99)
Potassium: 5.2 mEq/L (ref 3.7–5.3)
Sodium: 136 mEq/L — ABNORMAL LOW (ref 137–147)

## 2014-10-07 LAB — BLOOD GAS, ARTERIAL
ACID-BASE EXCESS: 5.6 mmol/L — AB (ref 0.0–2.0)
BICARBONATE: 31.8 meq/L — AB (ref 20.0–24.0)
Drawn by: 249101
O2 Content: 5 L/min
O2 Saturation: 92.8 %
PCO2 ART: 54.5 mmHg — AB (ref 35.0–45.0)
PH ART: 7.383 (ref 7.350–7.450)
Patient temperature: 98.6
TCO2: 27.9 mmol/L (ref 0–100)
pO2, Arterial: 67.6 mmHg — ABNORMAL LOW (ref 80.0–100.0)

## 2014-10-07 LAB — PHOSPHORUS: Phosphorus: 2.2 mg/dL — ABNORMAL LOW (ref 2.3–4.6)

## 2014-10-07 LAB — MAGNESIUM: Magnesium: 2 mg/dL (ref 1.5–2.5)

## 2014-10-07 MED ORDER — POTASSIUM PHOSPHATES 15 MMOLE/5ML IV SOLN
10.0000 mmol | Freq: Once | INTRAVENOUS | Status: AC
  Administered 2014-10-07: 10 mmol via INTRAVENOUS
  Filled 2014-10-07: qty 3.33

## 2014-10-07 NOTE — Progress Notes (Signed)
I will meet with Mr. Manson PasseyBrown and his siblings today 10/19 1330 to discuss goals of care. Thank you for this consult.   Yong ChannelAlicia Lily Velasquez, NP Palliative Medicine Team Pager # [phone] (M-F 8a-5p) Team Phone # [phone] (Nights/Weekends)

## 2014-10-07 NOTE — Consult Note (Signed)
I have reviewed and discussed case with Nurse Practitioner And agree with documentation and plan as noted above   Margret Moat J. Joline Encalada D.O.  Palliative Medicine Team at [place]  Team Phone: [phone]    

## 2014-10-07 NOTE — Progress Notes (Signed)
CSW continuing to follow for disposition planning.  CSW reviewed chart and noted that pt continuing to need BiPap at times and doing poorly without BiPap. Per chart, PMT GOC ordered and PMT NP plans to meet with pt and pt siblings at 1:30 pm today.   CSW to follow up on recommendations from PMT GOC and continue to follow and provide support and assist with disposition planning.  Loletta SpecterSuzanna Kidd, MSW, LCSW Clinical Social Work [phone]

## 2014-10-07 NOTE — Progress Notes (Signed)
Inpatient Diabetes Program Recommendations  AACE/ADA: New Consensus Statement on Inpatient Glycemic Control (2013)  Target Ranges:  Prepandial:   less than 140 mg/dL      Peak postprandial:   less than 180 mg/dL (1-2 hours)      Critically ill patients:  140 - 180 mg/dL    Results for Philip [name], Philip [name] (MRN [id number]) as of 10/07/2014 07:39  Ref. Range 10/07/2014 03:40  Glucose Latest Range: 70-99 mg/dL 67 ([name])     Patient with History of Type 2 DM.  Takes Metformin 500 mg bid at home.  Admitted with CAP.  Was intubated and extubated but is at high risk for re-intubation per MD.  Note Novolog SSI d/c'd on 09/30/14.  CBGs are NOT currently being checked.  Patient had Hypoglycemia this AM on morning BMET.    MD- If within goals of care, please place order for CBG checks for patient Q4 hours     Will follow Ambrose FinlandJeannine Johnston [name] RN, MSN, CDE Diabetes Coordinator Inpatient Diabetes Program Team Pager: [phone] (8a-10p)

## 2014-10-07 NOTE — Progress Notes (Signed)
PULMONARY / CRITICAL CARE MEDICINE   Name: Philip [name] MRN: [id number] DOB: 12/03/1946  PCP [name],[name], MD    ADMISSION DATE:  09/24/2014 CONSULTATION DATE:  10/07/2014  REFERRING MD :  EDP  CHIEF COMPLAINT:  SOB  INITIAL PRESENTATION: [name] with SOB, cough, dizziness.  In ED, CXR suggestive of PNA.  He failed BiPAP and required intubation, PCCM consulted for admission.  STUDIES:  CXR 10/6 >> vascular congestion, b/l pleural effusions, RLL atx vs infiltrate. TTE 10/7 >> EF 60-65%, mod concentric hypertrophy, doppler parameters c/w gd I diastolic dysfxn   SIGNIFICANT EVENTS: 10/6  presented to ED, started on BiPAP but failed, required intubation. 10/7  ETT repositioned with poor volume return on vent, to 25 cm at teeth 10/8. Passed SBT, fully awake. Extubated.  10/9 hypertensive. Very congested cough, but no distress.  10/11: desaturations overnight remains on bipap 10/12: patient placed on bipap for increased WOB/RR. Patient reports distress. Using biPAP throughout day.  10/13: patient used biPAP overnight 10/14: up in chair. Looking better. PT and OT suggesting SNF.  10/15 FIO2 needs increased. Feels worse.  10/16: persistent low volumes  10/04/14 No sig clinical change. Feels about the same  10/05/14: Per  RN: refusing meds and thismight be a change. Was in chair yesterday but today in bed and? A bit more sleepy Denies any complaints. But was seen retching for several mintues  10/19 On NIMVS overnight. Try off nimvs and check abg  SUBJECTIVE/OVERNIGHT/INTERVAL HX 10/19 Off bipap and follows commands, very weak and poor cough   VITAL SIGNS: Temp:  [98.2 F (36.8 C)-98.7 F (37.1 C)] 98.4 F (36.9 C) (10/19 0800) Pulse Rate:  [79-123] 121 (10/19 0841) Resp:  [16-28] 25 (10/19 0841) BP: (105-164)/(49-85) 126/78 mmHg (10/19 0841) SpO2:  [96 %-100 %] 100 % (10/19 0841) FiO2 (%):  [40 %] 40 % (10/19 0820) Weight:  [203 lb 0.7 oz (92.1 kg)]  203 lb 0.7 oz (92.1 kg) (10/19 0401) Vent Mode:  [-]  FiO2 (%):  [40 %] 40 % 6 liters INTAKE / OUTPUT: Intake/Output     10/18 0701 - 10/19 0700 10/19 0701 - 10/20 0700   I.V. (mL/kg) 1150 (12.5) 50 (0.5)   IV Piggyback 600    Total Intake(mL/kg) 1750 (19) 50 (0.5)   Urine (mL/kg/hr) 1425 (0.6)    Stool 75 (0)    Total Output 1500     Net +250 +50        Urine Occurrence 2 x      PHYSICAL EXAMINATION: General: adult male . Currently off NIMVS. Poor cough Neuro: awake, completely oriented. No focal def , weak, rass 2 HEENT: No JVD. BIPAP off Cardiovascular: RRR, no M/R/G. Cap refill brisk fingers Lungs:decreased bs bases, poor cough mechanics  Abdomen: BS x 4, soft, NT/ND, colostomy bag in place with yellow stool and gas. Musculoskeletal: No gross deformities, no edema.  Skin: Intact, warm, dry  LABS: PULMONARY  Recent Labs Lab 10/05/14 1228 10/06/14 1226  PHART 7.345* 7.395  PCO2ART 65.1* 57.3*  PO2ART 56.6* 58.0*  HCO3 34.6* 34.3*  TCO2 30.0 29.7  O2SAT 89.0 90.2    CBC  Recent Labs Lab 10/04/14 0320 10/05/14 0355 10/07/14 0340  HGB 16.3 15.5 14.9  HCT 53.2* 49.3 46.2  WBC 17.3* 14.2* 9.4  PLT 158 128* PLATELET CLUMPS NOTED ON SMEAR, UNABLE TO ESTIMATE    COAGULATION No results found for this basename: INR,  in the last  168 hours  CARDIAC  No results found for this basename: TROPONINI,  in the last 168 hours  Recent Labs Lab 10/07/14 0340  PROBNP 314.5*     CHEMISTRY  Recent Labs Lab 10/02/14 0325 10/03/14 0320 10/04/14 0320 10/05/14 0355 10/06/14 0420 10/07/14 0340  NA 141 140 134* 144  --  136*  K 5.2 5.4* 5.0 5.2  --  5.2  CL 99 99 95* 101  --  95*  CO2 33* 35* 34* 35*  --  31  GLUCOSE 140* 133* 103* 129*  --  67*  BUN 58* 69* 51* 31*  --  22  CREATININE 1.41* 1.67* 1.27 1.17  --  0.95  CALCIUM 9.2 9.2 9.2 8.9  --  8.8  MG  --   --   --   --  1.8 2.0  PHOS  --   --   --   --  2.5 2.2*   Estimated Creatinine Clearance: 83.7  ml/min (by C-G formula based on Cr of 0.95).   LIVER  Recent Labs Lab 10/04/14 0320 10/05/14 0355  AST 21 19  ALT 32 29  ALKPHOS 53 47  BILITOT 0.4 0.5  PROT 6.8 6.3  ALBUMIN 3.0* 2.8*     INFECTIOUS No results found for this basename: LATICACIDVEN, PROCALCITON,  in the last 168 hours   ENDOCRINE CBG (last 3)  No results found for this basename: GLUCAP,  in the last 72 hours       IMAGING x48h Dg Chest Port 1 View  10/07/2014   CLINICAL DATA:  Acute respiratory failure.  Subsequent encounter.  EXAM: PORTABLE CHEST - 1 VIEW  COMPARISON:  10/05/2014; 10/04/2014; chest CT - 10/04/2014  FINDINGS: Grossly unchanged enlarged silhouette and mediastinal contours. Lung volumes remain reduced with grossly unchanged small bilateral effusions and associated bibasilar heterogeneous/consolidative opacities, left greater than right. Pulmonary vasculature is indistinct with cephalization of flow. No definite pneumothorax though note, evaluation is degraded secondary to patient's overlying chin. Unchanged bones.  IMPRESSION: Similar findings of hypoventilation, mild pulmonary edema, small bilateral effusions and associated bibasilar opacities, atelectasis versus infiltrate.   Electronically Signed   By: Simonne Come M.D.   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 ? Mucous plugging, possibly element of effusion, but ATX the larger component .    - 10/19: doing poorly without bipap. Very poor reserve  P:   ABG off bipap BiPAP off for now , may change to Q4h on/Off and nocturnal depending on abg Low threshold to inbuate to help with pulmonary toilet DuoNebs / PRN Albuterol. Flutter. Cont prednisone 20 mg  Continue brovana and budesonide   nasal hygiene rx  Mobilize if able   CARDIOVASCULAR A:  Hx HTN, HLD - BNP, lactate, cortisol, troponin wnl HTN   - normal bp/hr P:  pravachol home meds.  Continue home norvasc, cardura, lotensin PRN hydralazine for BP >160 DC'd coreg in view of resp issues   RENAL   A:   Acute kidney injury better -- hypernatremia resolved > per his PCP this is typical for him when he gets dehydrated w/ his colostomy. Looks better s/p IVFs     - improved creat 10/07/14 P:     IVFs to  50cc/hj  BMP daily in icu.   GASTROINTESTINAL A:   GI prophylaxis Nutrition   -  mild low mag 10/06/14, resolved 10/19  - mild low phos 10/07/14 +  P:   Replete phos reglan prn NPO except mds Colostomy care  HEMATOLOGIC A:   VTE Prophylaxis Polycythemia, likely secondary to respiratory disease P:  SCD's / Lovenox. CBC daily in icu  INFECTIOUS A:   Leukocytosis CAP - left consolidation: rhinovirus w/ prob bacterial superinfection  Enterococcus UTI  P:   BCx2 10/7 >> neg  UCx 10/7 >> enterococcus (amp sens) RVP 10/7 >> + rhinovirus   Changed to oral levaquin 10/9>>>10/15 Consider dc vanco 10/19 Vanc 10/15>>> ceftaz 10/15>>>  ENDOCRINE A:   DM   P:   SSI  Hold outpatient metformin.  NEUROLOGIC A:   Acute metabolic encephalopathy-->resolved.  Deconditioning.      - confusion resolved 10/06/14 after going on bipap 10/05/14, alert 10/19  P:   Physical therapy to assess Monitor closely  FAMILY Updates:  None at bedside 10/19  IDT family meet - LOS 13 days and he will need a detailed family meet this upcoming week. We will ask palliative care to consult 10/19 for goals of care.   TODAY'S SUMMARY:  Try off nimvs. Palliative care consult.  Brett CanalesSteve Minor ACNP [facility] P[facility] Pager [phone] till 3 pm If no answer page [phone] 10/07/2014, 9:46 AM   STAFF NOTE: I, Dr Lavinia SharpsM Kalyb Pemble have personally reviewed patient's available data, including medical history, events of  note, physical examination and test results as part of my evaluation. I have discussed with resident/NP and other care providers such as pharmacist, RN and RRT.  In addition,  I personally evaluated patient and elicited key findings of - acute on chronic respiratory failure. Unable to get off bipap for sustained period of time. Very significantly deconditioned and very poor reserve. High risk for reintubation and high risk for prolonged hospital course and sNF / LTAC need. Have asked NP to call palliative for goals of care  Rest per NP/medical resident whose note is outlined above and that I agree with  The patient is critically ill with multiple organ systems failure and requires high complexity decision making for assessment and support, frequent evaluation and titration of therapies, application of advanced monitoring technologies and extensive interpretation of multiple databases.   Critical Care Time devoted to patient care services described in this note is  30  Minutes. This time reflects time of care of this signee Dr Kalman ShanMurali Devanny Palecek. This critical care time does not reflect procedure time, or teaching time or supervisory time of PA/NP/Med student/Med Resident etc but could involve care discussion time    Dr. Kalman ShanMurali Charity Tessier, M.D., [facility] Pulmonary and Critical Care Medicine Staff Physician [facility] [place] [facility] and Critical Care Pager: [phone], If no answer or between  15:00h - 7:00h: call [phone]  10/07/2014 10:51 AM

## 2014-10-07 NOTE — Consult Note (Signed)
Patient Philip [name]      DOB: 1946-12-15      QQI:[id number]     Consult Note from the Palliative Medicine Team at [place] Requested by: Gaylyn Lambert, NP     PCP: Jerlyn Ly, MD Reason for Consultation: [place] clarification     Phone Number:[phone]  Assessment of patients Current state: I met today with Philip [name] who goes by "Philip [name]" and his brothers and sisters Philip [name] (Philip [name]), Philip [name], and Philip [name]. Philip [name] tells me that he loves sports, golf and football in particular, but he is unable to play since 2004. He continues to enjoy hanging out at the golf course with his buddies. Most importantly to him is his family. His siblings tell me that he is their rock and that he is the most selfless and giving person. He lives with his mother and brother and he moved in to care for his mother after helping his sister. He worked in [employer] and travelled the [place] and then he retired from the [employer]. His family tells me they have noticed considerable changes in increased fatigue and shortness of breath and work of breathing. We discussed quality of life and decisions such as code status and what this means to him. He clearly says that he does not wish to ever have the tube in his throat again. Family supports his decision for no intubation. However, he does tell me that he wants CPR/shock if his heart stops. Explained that this will not be useful if his lungs are the problem and he is not breathing then restarting his heart will not help. His family agrees this is not helpful but Philip [name] says that he still wants this and his family supports whatever he says - I am unsure of his current fluctuating mental state so I will continue this conversation. Family also asked about hospice if he is able to leave the hospital and they are open to this discussion if the time comes. Family is very supportive.    Goals of Care: 1.  Code Status: FULL   2. Scope of Treatment: 1. Vital Signs:  yes  2. Respiratory/Oxygen: yes - BiPAP okay.  3. Nutritional Support/Tube Feeds: He does not want artificial feeding temporary or otherwise.  4. Antibiotics: yes 5. IVF: yes 6. Labs: yes 7. Telemetry: yes   4. Disposition: To be determined on outcomes.    3. Symptom Management:   1. Shortness of breath: Consider low dose morphine if needed.  2. Cough: Mucinex BID.  3. Bowel Regimen: Colace daily.  4. Weakness: Continue medical management. PT following.   4. Psychosocial: Emotional support provided to patient and family. Philip [name] and family very tearful.   5. Spiritual: Sister Philip [name] says her husband is a Theme park manager and that they have much spiritual support and Philip [name] agrees.    Patient Documents Completed or Given: Document Given Completed  Advanced Directives Pkt    MOST    DNR    Gone from My Sight    Hard Choices yes     Brief HPI: 68 yo male admitted with SOB, cough and dizziness. Being treated for community acquired pneumonia, acute exacerbation of COPD, rhinovirus, enterococcus UTI. He required intubation and has continued to need BiPAP frequently. Continues to be tachypneic with high oxygen needs. Acute kidney injury resolved. PMH significant for tobacco abuse (quit 2010), obstructive sleep apnea, hypertension, hyperlipidemia, diabetes mellitus type II, h/o prostate cancer, h/o colostomy.    ROS: + tachypneia, +  shortness of breath. Denies pain, anxiety, nausea, constipation.     PMH:  Past Medical History  Diagnosis Date  . Alcohol abuse, in remission 2004  . Diabetes mellitus     Type II  . HTN (hypertension)   . Hyperlipidemia   . History of prostate cancer   . Vitamin D deficiency   . Tobacco abuse   . History of colostomy 2004  . History of MRSA infection 2009  . Intestinal ischemia 2004     PSH: Past Surgical History  Procedure Laterality Date  . 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 Colostomy     I have reviewed the FH and SH and  If  appropriate update it with new information. No Known Allergies Scheduled Meds: . amLODipine  10 mg Oral Daily  . antiseptic oral rinse  7 mL Mouth Rinse BID  . cefTAZidime (FORTAZ)  IV  2 g Intravenous Q8H  . cloNIDine  0.1 mg Oral TID  . docusate sodium  100 mg Oral Daily  . doxazosin  2 mg Oral Daily  . enoxaparin (LOVENOX) injection  40 mg Subcutaneous Q24H  . feeding supplement (ENSURE COMPLETE)  237 mL Oral Q24H  . fluticasone  2 spray Each Nare BID  . guaiFENesin  1,200 mg Oral BID  . Influenza vac split quadrivalent PF  0.5 mL Intramuscular Tomorrow-1000  . ipratropium-albuterol  3 mL Nebulization Q6H  . pantoprazole  40 mg Oral Q1200  . potassium phosphate IVPB (mmol)  10 mmol Intravenous Once  . pravastatin  20 mg Oral q1800  . predniSONE  20 mg Oral Q breakfast  . vancomycin  1,250 mg Intravenous Q12H   Continuous Infusions: . sodium chloride 50 mL (10/06/14 1723)   PRN Meds:.hydrALAZINE, metoCLOPramide (REGLAN) injection    BP 131/71  Pulse 118  Temp(Src) 98.4 F (36.9 C) (Axillary)  Resp 19  Ht $R'5\' 10"'Yv$  (1.778 m)  Wt 92.1 kg (203 lb 0.7 oz)  BMI 29.13 kg/m2  SpO2 99%   PPS: 30%   Intake/Output Summary (Last 24 hours) at 10/07/14 1222 Last data filed at 10/07/14 1203  Gross per 24 hour  Intake   1842 ml  Output   1500 ml  Net    342 ml   LBM: 10/06/14                  Physical Exam:  General: NAD, appears fatigued HEENT: [place]/AT, no JVD, moist mucous membranes without exudate Chest: Severely diminished in bases, tachypneic CVS: Tachycardic, S1 S2 Abdomen: Soft, NT, ND, colostomy Ext: MAE, no edema, warm to touch Neuro: Awake, sleepy, oriented x 3, follows commands  Labs: CBC    Component Value Date/Time   WBC 9.4 10/07/2014 0340   RBC 4.96 10/07/2014 0340   HGB 14.9 10/07/2014 0340   HCT 46.2 10/07/2014 0340   PLT PLATELET CLUMPS NOTED ON SMEAR, UNABLE TO ESTIMATE 10/07/2014 0340   MCV 93.1 10/07/2014 0340   MCH 30.0 10/07/2014 0340   MCHC  32.3 10/07/2014 0340   RDW 13.6 10/07/2014 0340   LYMPHSABS 0.8 10/07/2014 0340   MONOABS 0.9 10/07/2014 0340   EOSABS 0.1 10/07/2014 0340   BASOSABS 0.0 10/07/2014 0340    BMET    Component Value Date/Time   NA 136* 10/07/2014 0340   K 5.2 10/07/2014 0340   CL 95* 10/07/2014 0340   CO2 31 10/07/2014 0340   GLUCOSE 67* 10/07/2014 0340   BUN 22 10/07/2014 0340  CREATININE 0.95 10/07/2014 0340   CALCIUM 8.8 10/07/2014 0340   GFRNONAA 83* 10/07/2014 0340   GFRAA >90 10/07/2014 0340    CMP     Component Value Date/Time   NA 136* 10/07/2014 0340   K 5.2 10/07/2014 0340   CL 95* 10/07/2014 0340   CO2 31 10/07/2014 0340   GLUCOSE 67* 10/07/2014 0340   BUN 22 10/07/2014 0340   CREATININE 0.95 10/07/2014 0340   CALCIUM 8.8 10/07/2014 0340   PROT 6.3 10/05/2014 0355   ALBUMIN 2.8* 10/05/2014 0355   AST 19 10/05/2014 0355   ALT 29 10/05/2014 0355   ALKPHOS 47 10/05/2014 0355   BILITOT 0.5 10/05/2014 0355   GFRNONAA 83* 10/07/2014 0340   GFRAA >90 10/07/2014 0340     Time In Time Out Total Time Spent with Patient Total Overall Time  1330 1455 67min 43min    Greater than 50%  of this time was spent counseling and coordinating care related to the above assessment and plan.  Vinie Sill, NP Palliative Medicine Team Pager # [phone] (M-F 8a-5p) Team Phone # [phone] (Nights/Weekends)

## 2014-10-07 NOTE — Progress Notes (Signed)
Physical Therapy Treatment Patient Details Name: Philip [name] MRN: [id number] DOB: 08/13/1946 Today's Date: 10/07/2014    History of Present Illness [name] with SOB, cough, dizziness. In ED, CXR suggestive of PNA. He failed BiPAP and required intubation. Pt extubated 10/8 and still requiring oxygen    PT Comments    Pt wanting to get OOB, allowed to  amb and progressing well but activity limited by therapist  d/t incr HR; HR 115--140--119 sats 99% --94--98 on 5L RR  35--41---33   Follow Up Recommendations  SNF;Supervision/Assistance - 24 hour     Equipment Recommendations  None recommended by PT    Recommendations for Other Services       Precautions / Restrictions Precautions Precautions: Fall Precaution Comments: decreased sats and and increased RR    Mobility  Bed Mobility Overal bed mobility: Needs Assistance Bed Mobility: Supine to Sit     Supine to sit: Min assist     General bed mobility comments: cues for technique, incr time  Transfers Overall transfer level: Needs assistance Equipment used: Rolling walker (2 wheeled) Transfers: Sit to/from Stand Sit to Stand: Min assist         General transfer comment: cues for hand placement  Ambulation/Gait Ambulation/Gait assistance: Min assist;+2 safety/equipment Ambulation Distance (Feet): 10 Feet Assistive device: Rolling walker (2 wheeled) Gait Pattern/deviations: Step-through pattern     General Gait Details: cues for RW safety; distance limited by PT d/t incr HR   Stairs            Wheelchair Mobility    Modified Rankin (Stroke Patients Only)       Balance                                    Cognition Arousal/Alertness: Awake/alert Behavior During Therapy: WFL for tasks assessed/performed Overall Cognitive Status: Within Functional Limits for tasks assessed                      Exercises      General Comments         Pertinent Vitals/Pain Pain Assessment: No/denies pain    Home Living                      Prior Function            PT Goals (current goals can now be found in the care plan section) Acute Rehab PT Goals Patient Stated Goal: get better!, walk PT Goal Formulation: With patient Time For Goal Achievement: 2014-09-08 Potential to Achieve Goals: Good Progress towards PT goals: Progressing toward goals    Frequency  Min 3X/week    PT Plan Current plan remains appropriate    Co-evaluation             End of Session Equipment Utilized During Treatment: Gait belt Activity Tolerance: Treatment limited secondary to medical complications (Comment) (incr HR and RR) Patient left: in chair;with call bell/phone within reach;with chair alarm set     Time: [id number] PT Time Calculation (min): 26 min  Charges:  $Gait Training: 8-22 mins $Therapeutic Activity: 8-22 mins                    G Codes:      Philip [name] 10/07/2014, 1:43 PM

## 2014-10-08 ENCOUNTER — Inpatient Hospital Stay (HOSPITAL_COMMUNITY): Payer: Medicare Other

## 2014-10-08 LAB — BASIC METABOLIC PANEL
Anion gap: 10 (ref 5–15)
BUN: 19 mg/dL (ref 6–23)
CHLORIDE: 97 meq/L (ref 96–112)
CO2: 32 meq/L (ref 19–32)
Calcium: 9.1 mg/dL (ref 8.4–10.5)
Creatinine, Ser: 0.96 mg/dL (ref 0.50–1.35)
GFR calc Af Amer: >90 mL/min (ref >90)
GFR, EST NON AFRICAN AMERICAN: 83 mL/min — AB (ref >90)
GLUCOSE: 88 mg/dL (ref 70–99)
Potassium: 4.3 mEq/L (ref 3.7–5.3)
SODIUM: 139 meq/L (ref 137–147)

## 2014-10-08 LAB — CBC WITH DIFFERENTIAL/PLATELET
Basophils Absolute: 0 10*3/uL (ref 0.0–0.1)
Basophils Relative: 0 % (ref 0–1)
Eosinophils Absolute: 0.1 10*3/uL (ref 0.0–0.7)
Eosinophils Relative: 1 % (ref 0–5)
HCT: 44.6 % (ref 39.0–52.0)
HEMOGLOBIN: 14.3 g/dL (ref 13.0–17.0)
LYMPHS PCT: 7 % — AB (ref 12–46)
Lymphs Abs: 0.9 10*3/uL (ref 0.7–4.0)
MCH: 29 pg (ref 26.0–34.0)
MCHC: 32.1 g/dL (ref 30.0–36.0)
MCV: 90.5 fL (ref 78.0–100.0)
MONOS PCT: 9 % (ref 3–12)
Monocytes Absolute: 1.3 10*3/uL — ABNORMAL HIGH (ref 0.1–1.0)
NEUTROS ABS: 11.5 10*3/uL — AB (ref 1.7–7.7)
NEUTROS PCT: 83 % — AB (ref 43–77)
PLATELETS: 123 10*3/uL — AB (ref 150–400)
RBC: 4.93 MIL/uL (ref 4.22–5.81)
RDW: 13.3 % (ref 11.5–15.5)
WBC: 13.8 10*3/uL — AB (ref 4.0–10.5)

## 2014-10-08 LAB — PHOSPHORUS: Phosphorus: 1.6 mg/dL — ABNORMAL LOW (ref 2.3–4.6)

## 2014-10-08 LAB — MAGNESIUM: MAGNESIUM: 1.7 mg/dL (ref 1.5–2.5)

## 2014-10-08 MED ORDER — FUROSEMIDE 10 MG/ML IJ SOLN
40.0000 mg | Freq: Once | INTRAMUSCULAR | Status: AC
  Administered 2014-10-08: 40 mg via INTRAVENOUS
  Filled 2014-10-08: qty 4

## 2014-10-08 MED ORDER — SODIUM PHOSPHATE 3 MMOLE/ML IV SOLN
30.0000 mmol | Freq: Once | INTRAVENOUS | Status: AC
  Administered 2014-10-08: 30 mmol via INTRAVENOUS
  Filled 2014-10-08: qty 10

## 2014-10-08 MED ORDER — POTASSIUM CHLORIDE 10 MEQ/100ML IV SOLN
10.0000 meq | INTRAVENOUS | Status: DC
  Filled 2014-10-08: qty 100

## 2014-10-08 MED ORDER — POTASSIUM CHLORIDE CRYS ER 20 MEQ PO TBCR
30.0000 meq | EXTENDED_RELEASE_TABLET | Freq: Two times a day (BID) | ORAL | Status: DC
  Administered 2014-10-08 – 2014-10-11 (×7): 30 meq via ORAL
  Filled 2014-10-08 (×14): qty 1

## 2014-10-08 NOTE — Progress Notes (Signed)
OT Cancellation Note  Patient Details Name: Philip [name] MRN: [id number] DOB: 08/14/1946   Cancelled Treatment:    Reason Eval/Treat Not Completed: Medical issues which prohibited therapy.  Pt resting with HOB up.  Noted HR 130s and 140s at rest.  Will check back tomorrow if schedule permits.   Emmery Seiler 10/08/2014, 4:05 PM Marica OtterMaryellen Gerald Kuehl, OTR/L [phone] 10/08/2014

## 2014-10-08 NOTE — Progress Notes (Signed)
Progress Note from the Palliative Medicine Team at [facility]  Subjective: [name] - "Philip [name]" - is more awake and alert today. His sister Philip [name] is at bedside during our conversation. He is now telling me that he wants "the tube" if he needs it. I explained that this is not what he told us yesterday and he says he knows that he changed his mind and even though being intubated was awful "I don't want to die." He tells me he wants us to do everything we can to keep him alive. He does say that he would NOT want a tracheostomy or long term ventilation and that at that point "to let him go." He does not want to suffer and he does not want to be dependent on others to care for him but understands that this may not be avoidable if he requires intubation and/or ACLS. He has no complaints and says that he does not feel any difficulty with his breathing even when his respiratory rate is high 30-40s.Sister, Philip [name], is at bedside and says she will relay his wishes to the rest of her family. I will follow up tomorrow.     Objective: No Known Allergies Scheduled Meds: . amLODipine  10 mg Oral Daily  . antiseptic oral rinse  7 mL Mouth Rinse BID  . cefTAZidime (FORTAZ)  IV  2 g Intravenous Q8H  . cloNIDine  0.1 mg Oral TID  . docusate sodium  100 mg Oral Daily  . doxazosin  2 mg Oral Daily  . enoxaparin (LOVENOX) injection  40 mg Subcutaneous Q24H  . feeding supplement (ENSURE COMPLETE)  237 mL Oral Q24H  . fluticasone  2 spray Each Nare BID  . guaiFENesin  1,200 mg Oral BID  . Influenza vac split quadrivalent PF  0.5 mL Intramuscular Tomorrow-1000  . ipratropium-albuterol  3 mL Nebulization Q6H  . pantoprazole  40 mg Oral Q1200  . potassium chloride  30 mEq Oral BID  . pravastatin  20 mg Oral q1800  . predniSONE  20 mg Oral Q breakfast   Continuous Infusions: . sodium chloride 50 mL (10/07/14 1754)   PRN Meds:.hydrALAZINE, metoCLOPramide (REGLAN) injection  BP 124/65  Pulse 125  Temp(Src) 98.4  F (36.9 C) (Oral)  Resp 31  Ht 5\' 10"  (1.778 m)  Wt 92.2 kg (203 lb 4.2 oz)  BMI 29.17 kg/m2  SpO2 93%   PPS: 30%     Intake/Output Summary (Last 24 hours) at 10/08/14 1509 Last data filed at 10/08/14 1400  Gross per 24 hour  Intake   2905 ml  Output   2535 ml  Net    370 ml      LBM: 10/20      Physical Exam:  General: NAD, appears fatigued  HEENT: [place]/AT, no JVD, moist mucous membranes without exudate  Chest: Severely diminished in bases, tachypneic  CVS: Tachycardic, S1 S2  Abdomen: Soft, NT, ND, colostomy  Ext: MAE, no edema, warm to touch  Neuro: Awake, sleepy, oriented x 3, follows commands   Labs: CBC    Component Value Date/Time   WBC 13.8* 10/08/2014 0355   RBC 4.93 10/08/2014 0355   HGB 14.3 10/08/2014 0355   HCT 44.6 10/08/2014 0355   PLT 123* 10/08/2014 0355   MCV 90.5 10/08/2014 0355   MCH 29.0 10/08/2014 0355   MCHC 32.1 10/08/2014 0355   RDW 13.3 10/08/2014 0355   LYMPHSABS 0.9 10/08/2014 0355   MONOABS 1.3* 10/08/2014 0355   EOSABS 0.1 10/08/2014  0355   BASOSABS 0.0 10/08/2014 0355    BMET    Component Value Date/Time   NA 139 10/08/2014 0355   K 4.3 10/08/2014 0355   CL 97 10/08/2014 0355   CO2 32 10/08/2014 0355   GLUCOSE 88 10/08/2014 0355   BUN 19 10/08/2014 0355   CREATININE 0.96 10/08/2014 0355   CALCIUM 9.1 10/08/2014 0355   GFRNONAA 83* 10/08/2014 0355   GFRAA >90 10/08/2014 0355    CMP     Component Value Date/Time   NA 139 10/08/2014 0355   K 4.3 10/08/2014 0355   CL 97 10/08/2014 0355   CO2 32 10/08/2014 0355   GLUCOSE 88 10/08/2014 0355   BUN 19 10/08/2014 0355   CREATININE 0.96 10/08/2014 0355   CALCIUM 9.1 10/08/2014 0355   PROT 6.3 10/05/2014 0355   ALBUMIN 2.8* 10/05/2014 0355   AST 19 10/05/2014 0355   ALT 29 10/05/2014 0355   ALKPHOS 47 10/05/2014 0355   BILITOT 0.5 10/05/2014 0355   GFRNONAA 83* 10/08/2014 0355   GFRAA >90 10/08/2014 0355    Assessment and Plan: 1. Code Status: FULL 2. Symptom  Control:  1. Shortness of breath: Consider low dose morphine if needed.  2. Cough: Mucinex BID.  3. Bowel Regimen: Colace daily.  4. Weakness: Continue medical management. PT following.  3. Psycho/Social: Emotional support provided to patient and family.  4. Spiritual: Support from family and personal friends for spiritual support.  5. Disposition: To be determined on outcomes.     Time In Time Out Total Time Spent with Patient Total Overall Time  1330 1350 15min 20min    Greater than 50%  of this time was spent counseling and coordinating care related to the above assessment and plan.  Yong ChannelAlicia Cranston Koors, NP Palliative Medicine Team Pager # [phone] (M-F 8a-5p) Team Phone # [phone] (Nights/Weekends)

## 2014-10-08 NOTE — Progress Notes (Signed)
PULMONARY / CRITICAL CARE MEDICINE   Name: Philip [name] MRN: [id number] DOB: 1946/09/22  PCP Jerlyn Ly, MD    ADMISSION DATE:  09/24/2014 CONSULTATION DATE:  10/08/2014  REFERRING MD :  EDP  CHIEF COMPLAINT:  SOB  INITIAL PRESENTATION: [name] brought to [facility] ED on 10/6 with SOB, cough, dizziness.  In ED, CXR suggestive of PNA.  He failed BiPAP and required intubation, PCCM consulted for admission.  STUDIES:  CXR 10/6 >> vascular congestion, b/l pleural effusions, RLL atx vs infiltrate. TTE 10/7 >> EF 60-65%, mod concentric hypertrophy, doppler parameters c/w gd I diastolic dysfxn   SIGNIFICANT EVENTS: 10/6  presented to ED, started on BiPAP but failed, required intubation. 10/7  ETT repositioned with poor volume return on vent, to 25 cm at teeth 10/8. Passed SBT, fully awake. Extubated.  10/9 hypertensive. Very congested cough, but no distress.  10/11: desaturations overnight remains on bipap 10/12: patient placed on bipap for increased WOB/RR. Patient reports distress. Using biPAP throughout day.  10/13: patient used biPAP overnight 10/14: up in chair. Looking better. PT and OT suggesting SNF.  10/15 FIO2 needs increased. Feels worse.  10/16: persistent low volumes  10/04/14 No sig clinical change. Feels about the same  10/05/14: Per  RN: refusing meds and thismight be a change. Was in chair yesterday but today in bed and? A bit more sleepy Denies any complaints. But was seen retching for several mintues  10/19 On NIMVS overnight. Try off nimvs and check abg 10/19 met with palliative care and is a partial code, no intubation but ok for cpr, shock, chemicals.  10/20 used nimvs during the night.    SUBJECTIVE/OVERNIGHT/INTERVAL HX 10/20 remains weak with poor pulmonary reserves.   VITAL SIGNS: Temp:  [97.7 F (36.5 C)-99.2 F (37.3 C)] 98.2 F (36.8 C) (10/20 0400) Pulse Rate:  [58-131] 131 (10/20 0600) Resp:  [16-37] 23 (10/20 0600) BP: (121-167)/(54-104)  152/104 mmHg (10/20 0600) SpO2:  [90 %-100 %] 90 % (10/20 0600) FiO2 (%):  [40 %] 40 % (10/20 0223) Weight:  [203 lb 4.2 oz (92.2 kg)] 203 lb 4.2 oz (92.2 kg) (10/20 0400) Vent Mode:  [-]  FiO2 (%):  [40 %] 40 % 6 liters INTAKE / OUTPUT: Intake/Output     10/19 0701 - 10/20 0700 10/20 0701 - 10/21 0700   P.O. 820    I.V. (mL/kg) 1150 (12.5)    IV Piggyback 993    Total Intake(mL/kg) 2963 (32.1)    Urine (mL/kg/hr) 1655 (0.7)    Stool 155 (0.1)    Total Output 1810     Net +1153            PHYSICAL EXAMINATION: General: adult male . Currently off NIMVS. Poor cough, poor understanding of code situation. Neuro: awake, may have some confusion that is subtle. No focal def , weak, rass 2 HEENT: No JVD. BIPAP off Cardiovascular: RRR, no M/R/G. Cap refill brisk fingers Lungs:decreased bs bases, poor cough mechanics , congested cough Abdomen: BS x 4, soft, NT/ND, colostomy bag in place with yellow stool and gas. No co abd pain Musculoskeletal: No gross deformities, no edema.  Skin: Intact, warm, dry  LABS: PULMONARY  Recent Labs Lab 10/05/14 1228 10/06/14 1226 10/07/14 1038  PHART 7.345* 7.395 7.383  PCO2ART 65.1* 57.3* 54.5*  PO2ART 56.6* 58.0* 67.6*  HCO3 34.6* 34.3* 31.8*  TCO2 30.0 29.7 27.9  O2SAT 89.0 90.2 92.8    CBC  Recent Labs Lab 10/05/14 0355 10/07/14 0340  10/08/14 0355  HGB 15.5 14.9 14.3  HCT 49.3 46.2 44.6  WBC 14.2* 9.4 13.8*  PLT 128* PLATELET CLUMPS NOTED ON SMEAR, UNABLE TO ESTIMATE 123*    COAGULATION No results found for this basename: INR,  in the last 168 hours  CARDIAC  No results found for this basename: TROPONINI,  in the last 168 hours  Recent Labs Lab 10/07/14 0340  PROBNP 314.5*     CHEMISTRY  Recent Labs Lab 10/03/14 0320 10/04/14 0320 10/05/14 0355 10/06/14 0420 10/07/14 0340 10/08/14 0355  NA 140 134* 144  --  136* 139  K 5.4* 5.0 5.2  --  5.2 4.3  CL 99 95* 101  --  95* 97  CO2 35* 34* 35*  --  31 32   GLUCOSE 133* 103* 129*  --  67* 88  BUN 69* 51* 31*  --  22 19  CREATININE 1.67* 1.27 1.17  --  0.95 0.96  CALCIUM 9.2 9.2 8.9  --  8.8 9.1  MG  --   --   --  1.8 2.0 1.7  PHOS  --   --   --  2.5 2.2* 1.6*   Estimated Creatinine Clearance: 82.9 ml/min (by C-G formula based on Cr of 0.96).   LIVER  Recent Labs Lab 10/04/14 0320 10/05/14 0355  AST 21 19  ALT 32 29  ALKPHOS 53 47  BILITOT 0.4 0.5  PROT 6.8 6.3  ALBUMIN 3.0* 2.8*     INFECTIOUS No results found for this basename: LATICACIDVEN, PROCALCITON,  in the last 168 hours   ENDOCRINE CBG (last 3)  No results found for this basename: GLUCAP,  in the last 72 hours       IMAGING x48h Dg Chest Port 1 View  10/08/2014   CLINICAL DATA:  Respiratory failure.  EXAM: PORTABLE CHEST - 1 VIEW  COMPARISON:  10/07/2014  FINDINGS: Persistent low lung volumes with streaky basilar densities. Upper lungs remain clear. Heart size is grossly stable. Negative for a pneumothorax.  IMPRESSION: Persistent low lung volumes with bibasilar opacities. Differential includes atelectasis versus infection.   Electronically Signed   By: Markus Daft M.D.   On: 10/08/2014 07:41   Dg Chest Port 1 View  10/07/2014   CLINICAL DATA:  Acute respiratory failure.  Subsequent encounter.  EXAM: PORTABLE CHEST - 1 VIEW  COMPARISON:  10/05/2014; 10/04/2014; chest CT - 10/04/2014  FINDINGS: Grossly unchanged enlarged silhouette and mediastinal contours. Lung volumes remain reduced with grossly unchanged small bilateral effusions and associated bibasilar heterogeneous/consolidative opacities, left greater than right. Pulmonary vasculature is indistinct with cephalization of flow. No definite pneumothorax though note, evaluation is degraded secondary to patient's overlying chin. Unchanged bones.  IMPRESSION: Similar findings of hypoventilation, mild pulmonary edema, small bilateral effusions and associated bibasilar opacities, atelectasis versus infiltrate.    Electronically Signed   By: Sandi Mariscal M.D.   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 ? Mucous plugging, possibly element of effusion, but ATX the larger component .    - 10/20:requires bipap nocturnally. Poor pulmonary reserves 10/19 ABG off bipap revealed pco2 54 with nl ph P:    BiPAP qhs and prn. He is a DNI DuoNebs / PRN Albuterol. Flutter. Cont prednisone 20  mg  Continue brovana and budesonide  nasal hygiene rx  Mobilize if able 10/20 try one dose of lasix   CARDIOVASCULAR A:  Hx HTN, HLD - BNP, lactate, cortisol, troponin wnl HTN   - normal bp/hr P:  pravachol home meds.  Continue home norvasc, cardura, lotensin PRN hydralazine for BP >160 DC'd coreg in view of resp issues   RENAL   A:   Acute kidney injury better -- hypernatremia resolved > per his PCP this is typical for him when he gets dehydrated w/ his colostomy. Looks better s/p IVFs     - improved creat 10/07/14 P:     IVFs to  50cc/hj BMP daily in icu. One dose of lasix  GASTROINTESTINAL A:   GI prophylaxis Nutrition   -  mild low mag 10/06/14, resolved 10/19  - mild low phos 10/07/14 +resolved  P:   reglan prn NPO except mds Colostomy care  HEMATOLOGIC A:   VTE Prophylaxis Polycythemia, likely secondary to respiratory disease P:  SCD's / Lovenox. CBC daily in icu  INFECTIOUS A:   Leukocytosis CAP - left consolidation: rhinovirus w/ prob bacterial superinfection  Enterococcus UTI  P:   BCx2 10/7 >> neg  UCx 10/7 >> enterococcus (amp sens) RVP 10/7 >> + rhinovirus   Changed to oral levaquin 10/9>>>10/15 Consider dc vanco 10/19 Vanc 10/15>>>10/20 dc no indication ceftaz 10/15>>>  ENDOCRINE   A:   DM   P:   SSI   Hold outpatient metformin.  NEUROLOGIC A:   Acute metabolic encephalopathy-->resolved.  Deconditioning.      - confusion resolved 10/06/14 after going on bipap 10/05/14, alert 10/19  P:   Physical therapy to assess Monitor closely  FAMILY Updates:  None at bedside 10/20   alliative care meet: DNI but otherwise full code. Unfortunately the major driving force is resp failure .   TODAY'S STAFF MD SUMMARY:  NIMVS as needed. Try gentle diuresis. Advance diet as to dys 3. HE is DNI but yes for CPR. Appreciate palliative seeing him. Personally evaluated patient. Rest per NP. He might need SNF  [facility] ACNP Maryanna Shape PCCM Pager [phone] till 3 pm If no answer page [phone] 10/08/2014, 8:12 AM   Dr. Brand Males, M.D., [facility].C.P Pulmonary and Critical Care Medicine Staff Physician [place] [facility] Pager: [phone], If no answer or between  15:00h - 7:00h: call [phone]  10/08/2014 12:12 PM

## 2014-10-08 NOTE — Progress Notes (Signed)
ANTIBIOTIC CONSULT NOTE - FOLLOW UP  Pharmacy Consult for Ceftazidime Indication: pneumonia  No Known Allergies  Patient Measurements: Height: 5\' 10"  (177.8 cm) Weight: 203 lb 4.2 oz (92.2 kg) IBW/kg (Calculated) : 73  Vital Signs: Temp: 98.4 F (36.9 C) (10/20 0800) Temp Source: Oral (10/20 0800) BP: 131/82 mmHg (10/20 1142) Pulse Rate: 120 (10/20 1053) Intake/Output from previous day: 10/19 0701 - 10/20 0700 In: 3013 [P.O.:820; I.V.:1200; IV Piggyback:993] Out: 1810 [Urine:1655; Stool:155] Intake/Output from this shift: Total I/O In: 200 [I.V.:200] Out: 525 [Urine:350; Stool:175]  Labs:  Recent Labs  10/07/14 0340 10/08/14 0355  WBC 9.4 13.8*  HGB 14.9 14.3  PLT PLATELET CLUMPS NOTED ON SMEAR, UNABLE TO ESTIMATE 123*  CREATININE 0.95 0.96   Estimated Creatinine Clearance: 82.9 ml/min (by C-G formula based on Cr of 0.96). No results found for this basename: VANCOTROUGH, VANCOPEAK, VANCORANDOM, GENTTROUGH, GENTPEAK, GENTRANDOM, TOBRATROUGH, TOBRAPEAK, TOBRARND, AMIKACINPEAK, AMIKACINTROU, AMIKACIN,  in the last 72 hours    Assessment: [age] yoM admitted 10/6 with SOB, cough, dizziness requiring intubation after failing BiPAP. CXR on admission suggestive of PNA and patient has been on antibiotics for CAP and Enterococcus UTI. On day #10 total antibiotics (day #10 total antibiotics for treatment of CAP + AECOPD and day #7 total antibiotics for treatment of Enterococcus UTI), patient's FIO2 needs increased and MD broadened antibiotics for nosocomial organisms for concern of worsening infection and broadening antibiotics . Pharmacy consulted to dose vancomycin and ceftazidime.  10/6 >> Ceftriaxone >>10/8 10/7 >> Azithromycin x 1 10/9 >> Levaquin >> 10/15 10/15 >> Vancomycin >> 10/20 10/15 >> Ceftazidime >>  Tmax: afeb WBC: 13.8 (on prednisone 20 mg) Renal: SCr 0.96, CrCl~93 ml/min (CG)  10/6 blood cx x 2: NGF 10/7 respiratory virus panel: Rhinovirus + 10/7 urine cx:  Enterococcus species (R- tetracycline, S-ampicillin, levaquin, NTF, Vanc) 10/7 trach aspirate cx: oral flora 10/7 MRSA screen: negative  Today is day #6 Ceftaz 2g IV q8h for pneumonia.  Vanc d/c'd today by PCCM.  Goal of Therapy:  Doses adjusted per renal function Eradication of infection  Plan:  1.  Continue Ceftazidime 2g IV q8h.  2.  F/u planned LOT.  Clance Bollunyon, Shalene Gallen 10/08/2014,12:23 PM

## 2014-10-08 NOTE — Progress Notes (Signed)
eLink Physician-Brief Progress Note Patient Name: Philip [name] DOB: 05/13/1946 MRN: [id number]   Date of Service  10/08/2014  HPI/Events of Note  Hypophos  eICU Interventions  Phos replaced     Intervention Category Intermediate Interventions: Electrolyte abnormality - evaluation and management  Jahmad Petrich 10/08/2014, 5:32 AM

## 2014-10-09 ENCOUNTER — Inpatient Hospital Stay (HOSPITAL_COMMUNITY): Payer: Medicare Other

## 2014-10-09 DIAGNOSIS — R5381 Other malaise: Secondary | ICD-10-CM

## 2014-10-09 LAB — CBC WITH DIFFERENTIAL/PLATELET
BASOS ABS: 0 10*3/uL (ref 0.0–0.1)
Basophils Relative: 0 % (ref 0–1)
Eosinophils Absolute: 0.2 10*3/uL (ref 0.0–0.7)
Eosinophils Relative: 1 % (ref 0–5)
HEMATOCRIT: 43.2 % (ref 39.0–52.0)
HEMOGLOBIN: 13.9 g/dL (ref 13.0–17.0)
LYMPHS PCT: 7 % — AB (ref 12–46)
Lymphs Abs: 1 10*3/uL (ref 0.7–4.0)
MCH: 29.1 pg (ref 26.0–34.0)
MCHC: 32.2 g/dL (ref 30.0–36.0)
MCV: 90.6 fL (ref 78.0–100.0)
MONO ABS: 1.3 10*3/uL — AB (ref 0.1–1.0)
MONOS PCT: 9 % (ref 3–12)
NEUTROS ABS: 12.6 10*3/uL — AB (ref 1.7–7.7)
NEUTROS PCT: 83 % — AB (ref 43–77)
Platelets: 125 10*3/uL — ABNORMAL LOW (ref 150–400)
RBC: 4.77 MIL/uL (ref 4.22–5.81)
RDW: 13.4 % (ref 11.5–15.5)
WBC: 15.1 10*3/uL — AB (ref 4.0–10.5)

## 2014-10-09 LAB — BASIC METABOLIC PANEL
ANION GAP: 10 (ref 5–15)
BUN: 22 mg/dL (ref 6–23)
CHLORIDE: 98 meq/L (ref 96–112)
CO2: 31 mEq/L (ref 19–32)
Calcium: 8.8 mg/dL (ref 8.4–10.5)
Creatinine, Ser: 1.16 mg/dL (ref 0.50–1.35)
GFR calc non Af Amer: 62 mL/min — ABNORMAL LOW (ref >90)
GFR, EST AFRICAN AMERICAN: 72 mL/min — AB (ref >90)
Glucose, Bld: 103 mg/dL — ABNORMAL HIGH (ref 70–99)
Potassium: 4.7 mEq/L (ref 3.7–5.3)
Sodium: 139 mEq/L (ref 137–147)

## 2014-10-09 LAB — MAGNESIUM: MAGNESIUM: 1.6 mg/dL (ref 1.5–2.5)

## 2014-10-09 LAB — PHOSPHORUS: Phosphorus: 3.3 mg/dL (ref 2.3–4.6)

## 2014-10-09 MED ORDER — FUROSEMIDE 10 MG/ML IJ SOLN
40.0000 mg | Freq: Once | INTRAMUSCULAR | Status: AC
  Administered 2014-10-09: 40 mg via INTRAVENOUS
  Filled 2014-10-09: qty 4

## 2014-10-09 MED ORDER — METOPROLOL TARTRATE 12.5 MG HALF TABLET
12.5000 mg | ORAL_TABLET | Freq: Two times a day (BID) | ORAL | Status: DC
  Administered 2014-10-09 – 2014-10-10 (×4): 12.5 mg via ORAL
  Filled 2014-10-09 (×4): qty 1

## 2014-10-09 MED ORDER — MAGNESIUM SULFATE 40 MG/ML IJ SOLN
2.0000 g | Freq: Once | INTRAMUSCULAR | Status: AC
  Administered 2014-10-09: 2 g via INTRAVENOUS
  Filled 2014-10-09: qty 50

## 2014-10-09 MED ORDER — METOCLOPRAMIDE HCL 5 MG/ML IJ SOLN: 5.0000 mg | Freq: Three times a day (TID) | INTRAMUSCULAR | Status: DC | PRN

## 2014-10-09 NOTE — Progress Notes (Signed)
Pt SOB, Sat decreased to mid 80's on 5 lpm. Administered neb tx, pt placed back in bed, and placed on BIPAP to rest. Sat increased to 97% on 40% BIPAP (Vision). RT will continue to monitor.

## 2014-10-09 NOTE — Progress Notes (Signed)
CSW continuing to follow.  Per RN notes, pt on Bipap, and continues to have increased work of breathing. HR remains tachy in upper 115-120's and irregular.   CSW noted that PMT NP has been following for GOC and pt has expressed that he wants all medical interventions.   CSW to continue to follow to provide support and assist with disposition planning as appropriate. Pt has multiple barriers to disposition planning at this time.  Loletta SpecterSuzanna Aryana Wonnacott, MSW, LCSW Clinical Social Work [phone]

## 2014-10-09 NOTE — Progress Notes (Signed)
Patient in bed, on Bipap, and continues to have increased work of breathing. HR remains tachy in upper 115-120's and irregular. Called and spoke with MD Vassie LollAlva in box and stated he would check more into him when he makes his round but no new orders at this time. Will continue to monitor.

## 2014-10-09 NOTE — Progress Notes (Signed)
Patient maintaining tachycardia. However, patient beginning to have more frequent PVCs and fluctuation of heartrate. EKG performed; showed to MD Ramaswamy. MD agrees on irregularity and asks if patient received magnesium (he did), and if lopressor given (he did). Advised if he continues to show frequent PVCs and irregularity of heartrate to call the box around 3. No new orders placed but MD is aware of irregularity. Will continue to monitor.

## 2014-10-09 NOTE — Progress Notes (Addendum)
PULMONARY / CRITICAL CARE MEDICINE   Name: Philip [name] MRN: [id number] DOB: 06/20/1946  PCP Jerlyn Ly, MD    ADMISSION DATE:  09/24/2014 CONSULTATION DATE:  10/09/2014  REFERRING MD :  EDP  CHIEF COMPLAINT:  SOB  INITIAL PRESENTATION: [name] brought to [facility] ED on 10/6 with SOB, cough, dizziness.  In ED, CXR suggestive of PNA.  He failed BiPAP and required intubation, PCCM consulted for admission.  STUDIES:  CXR 10/6 >> vascular congestion, b/l pleural effusions, RLL atx vs infiltrate. TTE 10/7 >> EF 60-65%, mod concentric hypertrophy, doppler parameters c/w gd I diastolic dysfxn   SIGNIFICANT EVENTS: 10/6  presented to ED, started on BiPAP but failed, required intubation. 10/7  ETT repositioned with poor volume return on vent, to 25 cm at teeth 10/8. Passed SBT, fully awake. Extubated.  10/9 hypertensive. Very congested cough, but no distress.  10/11: desaturations overnight remains on bipap 10/12: patient placed on bipap for increased WOB/RR. Patient reports distress. Using biPAP throughout day.  10/13: patient used biPAP overnight 10/14: up in chair. Looking better. PT and OT suggesting SNF.  10/15 FIO2 needs increased. Feels worse.  10/16: persistent low volumes  10/04/14 No sig clinical change. Feels about the same  10/05/14: Per  RN: refusing meds and thismight be a change. Was in chair yesterday but today in bed and? A bit more sleepy Denies any complaints. But was seen retching for several mintues  10/19 On NIMVS overnight. Try off nimvs and check abg 10/19 met with palliative care and is a partial code, no intubation but ok for cpr, shock, chemicals.  10/20 used nimvs during the night. 10/20 full code per his request via palliative care follow up.   SUBJECTIVE/OVERNIGHT/INTERVAL HX 10/21 tachycardic , on NIMVS.  (at time of staff MD rounds - off bipap and looking better). Now reversed code status to full code   VITAL SIGNS: Temp:  [97.5 F (36.4 C)-99.7 F  (37.6 C)] 99.7 F (37.6 C) (10/21 0400) Pulse Rate:  [66-133] 84 (10/21 0400) Resp:  [14-43] 14 (10/21 0400) BP: (99-169)/(49-85) 118/66 mmHg (10/21 0400) SpO2:  [88 %-100 %] 98 % (10/21 0400) FiO2 (%):  [40 %] 40 % (10/21 0220) Weight:  [203 lb 11.3 oz (92.4 kg)] 203 lb 11.3 oz (92.4 kg) (10/21 0400) Vent Mode:  [-]  FiO2 (%):  [40 %] 40 %  INTAKE / OUTPUT: Intake/Output     10/20 0701 - 10/21 0700 10/21 0701 - 10/22 0700   P.O. 480    I.V. (mL/kg) 925 (10)    IV Piggyback 150    Total Intake(mL/kg) 1555 (16.8)    Urine (mL/kg/hr) 1400 (0.6)    Stool 775 (0.3)    Total Output 2175     Net -620          Urine Occurrence 2 x      PHYSICAL EXAMINATION: General: adult male . Currently on NIMVS. Poor cough, dyspneic at rest Neuro: awake, may have some confusion that is subtle. No focal def , weak, rass 2. Full code per his request but no long term support ie tracheostomy HEENT: No JVD. BIPAP on Cardiovascular: RRR, no M/R/G. 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 No co abd pain Musculoskeletal: No gross deformities, mild edema.  Skin: Intact, warm, dry  LABS: PULMONARY  Recent Labs Lab 10/05/14 1228 10/06/14 1226  10/07/14 1038  PHART 7.345* 7.395 7.383  PCO2ART 65.1* 57.3* 54.5*  PO2ART 56.6* 58.0* 67.6*  HCO3 34.6* 34.3* 31.8*  TCO2 30.0 29.7 27.9  O2SAT 89.0 90.2 92.8    CBC  Recent Labs Lab 10/07/14 0340 10/08/14 0355 10/09/14 0322  HGB 14.9 14.3 13.9  HCT 46.2 44.6 43.2  WBC 9.4 13.8* 15.1*  PLT PLATELET CLUMPS NOTED ON SMEAR, UNABLE TO ESTIMATE 123* 125*    COAGULATION No results found for this basename: INR,  in the last 168 hours  CARDIAC  No results found for this basename: TROPONINI,  in the last 168 hours  Recent Labs Lab 10/07/14 0340  PROBNP 314.5*     CHEMISTRY  Recent Labs Lab 10/04/14 0320  10/05/14 0355 10/06/14 0420 10/07/14 0340 10/08/14 0355 10/09/14 0322  NA 134* 144  --  136* 139 139  K 5.0 5.2  --  5.2 4.3 4.7  CL 95* 101  --  95* 97 98  CO2 34* 35*  --  31 32 31  GLUCOSE 103* 129*  --  67* 88 103*  BUN 51* 31*  --  _0 CREATININE 1.27 1.17  --  0.95 0.96 1.16  CALCIUM 9.2 8.9  --  8.8 9.1 8.8  MG  --   --  1.8 2.0 1.7 1.6  PHOS  --   --  2.5 2.2* 1.6* 3.3   Estimated Creatinine Clearance: 68.7 ml/min (by C-G formula based on Cr of 1.16).   LIVER  Recent Labs Lab 10/04/14 0320 10/05/14 0355  AST 21 19  ALT 32 29  ALKPHOS 53 47  BILITOT 0.4 0.5  PROT 6.8 6.3  ALBUMIN 3.0* 2.8*     INFECTIOUS No results found for this basename: LATICACIDVEN, PROCALCITON,  in the last 168 hours   ENDOCRINE CBG (last 3)  No results found for this basename: GLUCAP,  in the last 72 hours       IMAGING x48h Dg Chest Port 1 View  10/08/2014   CLINICAL DATA:  Respiratory failure.  EXAM: PORTABLE CHEST - 1 VIEW  COMPARISON:  10/07/2014  FINDINGS: Persistent low lung volumes with streaky basilar densities. Upper lungs remain clear. Heart size is grossly stable. Negative for a pneumothorax.  IMPRESSION: Persistent low lung volumes with bibasilar opacities. Differential includes atelectasis versus infection.   Electronically Signed   By: Markus Daft M.D.   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 ? Mucous plugging, possibly element of effusion, but ATX the larger component .    - 10/21:requires bipap nocturnally. He has documented OSA.  Poor pulmonary reserves  P:    BiPAP qhs and prn. He is a full code per his request as of 10/20 DuoNebs / PRN Albuterol. Flutter. Cont prednisone 20 mg  Continue brovana  and budesonide  nasal hygiene rx  Mobilize if able(ot/PT report limited ability to assist in mobility  10/20  one dose of lasix and repeat 10/21 as he was negative 600 cc over night   CARDIOVASCULAR A:  Hx HTN, HLD - BNP, lactate, cortisol, troponin wnl HTN   - tachycardic after dc coreg on 10./19/15 P:  pravachol home meds.  Continue home norvasc, cardura, lotensin PRN hydralazine for BP >160  Start b1 specific lopressor in view of tachycardia   RENAL Lab Results  Component Value Date   CREATININE 1.16 10/09/2014   CREATININE 0.96 10/08/2014   CREATININE 0.95 10/07/2014    Recent Labs Lab 10/07/14 0340 10/08/14 0355 10/09/14 0322  NA 136* 139 139     A:   Acute kidney injury better -- hypernatremia resolved > per his PCP this is typical for him when he gets dehydrated w/ his colostomy. Looks better s/p IVFs   P:     IVFs to  50cc/hj BMP daily in icu. Repeat dose of lasix 10/21  GASTROINTESTINAL A:   GI prophylaxis Nutrition   -  mild low mag 10/06/14, resolved 10/19  - mild low phos 10/07/14 +resolved  P:   reglan prn On dys 3 diet as tolerated Colostomy care  HEMATOLOGIC A:   VTE Prophylaxis Polycythemia, likely secondary to respiratory disease P:  SCD's / Lovenox. CBC daily in icu  INFECTIOUS A:   Leukocytosis CAP - left consolidation: rhinovirus w/ prob bacterial superinfection  Enterococcus UTI  P:   BCx2 10/7 >> neg  UCx 10/7 >> enterococcus (amp sens) RVP 10/7 >> + rhinovirus   Changed to oral levaquin 10/9>>>10/15 Consider dc vanco 10/19 Vanc 10/15>>>10/20 dc no indication ceftaz 10/15>>> Consider dc all abx 10/21  ENDOCRINE   A:   DM   P:   SSI  Hold outpatient metformin.  NEUROLOGIC A:   Acute metabolic encephalopathy-->resolved.  Deconditioning.      - confusion resolved 10/06/14 after going on bipap 10/05/14, alert 10/20  P:   Physical therapy to assess, please see notes Monitor closely  FAMILY Updates:   None at bedside 10/21   Palliative care meet:  Full code on 10/20. Unfortunately the major driving force is resp failure .   TODAY'S STAFF MD SUMMARY:  NIMVS as needed. Try gentle diuresis. Advance diet as to dys 3.  Full code per his request. Continue nocturnal and prn NIMVS. Gentle diuresis as tolerated. He may need BB re instituted. He will need SNF/LTAC in future. Will try him to sit in chair. He is looking better x 24h. REst per NP  Richardson Landry Minor ACNP Maryanna Shape PCCM Pager [phone] till 3 pm If no answer page [phone] 10/09/2014, 7:47 AM   Dr. Brand Males, M.D., [facility].C.P Pulmonary and Critical Care Medicine Staff Physician [place] [facility] Pager: [phone], If no answer or between  15:00h - 7:00h: call [phone]  10/09/2014 9:22 AM

## 2014-10-09 NOTE — Progress Notes (Signed)
Maxcine HamSmiley was moving from chair to bed when I enter the room. He is short of breath and fatigued and requiring BiPAP now. I told him I will check in with him tomorrow. No changes in goals of care. He wants everything done to keep him living.   Yong ChannelAlicia Orla Jolliff, NP Palliative Medicine Team Pager # [phone] (M-F 8a-5p) Team Phone # [phone] (Nights/Weekends)

## 2014-10-09 NOTE — Progress Notes (Signed)
Upon entering room patient appearing SHOB and fatigue, hardly keeping eyes open. O2 at 89-90% on 5L N/C. Patient states feeling SHOB. Called respiratory and requested BiPaP. Respiratory currently at bedside. HR 112, O2 93%, BP 108/51, RR 29. Will continue to monitor.

## 2014-10-10 ENCOUNTER — Inpatient Hospital Stay (HOSPITAL_COMMUNITY): Payer: Medicare Other

## 2014-10-10 LAB — BLOOD GAS, ARTERIAL
Acid-Base Excess: 11.5 mmol/L — ABNORMAL HIGH (ref 0.0–2.0)
Acid-Base Excess: 9.6 mmol/L — ABNORMAL HIGH (ref 0.0–2.0)
Bicarbonate: 34.6 mEq/L — ABNORMAL HIGH (ref 20.0–24.0)
Bicarbonate: 34.3 mEq/L — ABNORMAL HIGH (ref 20.0–24.0)
DRAWN BY: 405301
Drawn by: 103701
FIO2: 100 %
FIO2: 0.6 %
LHR: 12 {breaths}/min
MECHVT: 510 mL
O2 Saturation: 99 %
O2 Saturation: 98.5 %
PATIENT TEMPERATURE: 98.6
PCO2 ART: 37.4 mmHg (ref 35.0–45.0)
PEEP/CPAP: 5 cmH2O
PEEP: 5 cmH2O
PH ART: 7.573 — AB (ref 7.350–7.450)
Patient temperature: 98.4
RATE: 16 resp/min
TCO2: 29.2 mmol/L (ref 0–100)
TCO2: 29.6 mmol/L (ref 0–100)
VT: 580 mL
pCO2 arterial: 46.5 mmHg — ABNORMAL HIGH (ref 35.0–45.0)
pH, Arterial: 7.481 — ABNORMAL HIGH (ref 7.350–7.450)
pO2, Arterial: 122 mmHg — ABNORMAL HIGH (ref 80.0–100.0)
pO2, Arterial: 110 mmHg — ABNORMAL HIGH (ref 80.0–100.0)

## 2014-10-10 LAB — BASIC METABOLIC PANEL
Anion gap: 8 (ref 5–15)
BUN: 16 mg/dL (ref 6–23)
CALCIUM: 8.9 mg/dL (ref 8.4–10.5)
CO2: 35 mEq/L — ABNORMAL HIGH (ref 19–32)
Chloride: 96 mEq/L (ref 96–112)
Creatinine, Ser: 1.04 mg/dL (ref 0.50–1.35)
GFR calc non Af Amer: 71 mL/min — ABNORMAL LOW (ref >90)
GFR, EST AFRICAN AMERICAN: 83 mL/min — AB (ref >90)
GLUCOSE: 89 mg/dL (ref 70–99)
Potassium: 4 mEq/L (ref 3.7–5.3)
Sodium: 139 mEq/L (ref 137–147)

## 2014-10-10 LAB — CBC WITH DIFFERENTIAL/PLATELET
BASOS PCT: 0 % (ref 0–1)
Basophils Absolute: 0 10*3/uL (ref 0.0–0.1)
EOS ABS: 0.1 10*3/uL (ref 0.0–0.7)
Eosinophils Relative: 1 % (ref 0–5)
HCT: 44 % (ref 39.0–52.0)
Hemoglobin: 13.8 g/dL (ref 13.0–17.0)
LYMPHS PCT: 12 % (ref 12–46)
Lymphs Abs: 1.6 10*3/uL (ref 0.7–4.0)
MCH: 29.2 pg (ref 26.0–34.0)
MCHC: 31.4 g/dL (ref 30.0–36.0)
MCV: 93.2 fL (ref 78.0–100.0)
MONO ABS: 0.4 10*3/uL (ref 0.1–1.0)
Monocytes Relative: 3 % (ref 3–12)
Neutro Abs: 10.9 10*3/uL — ABNORMAL HIGH (ref 1.7–7.7)
Neutrophils Relative %: 84 % — ABNORMAL HIGH (ref 43–77)
PLATELETS: 111 10*3/uL — AB (ref 150–400)
RBC: 4.72 MIL/uL (ref 4.22–5.81)
RDW: 13.5 % (ref 11.5–15.5)
WBC: 13 10*3/uL — ABNORMAL HIGH (ref 4.0–10.5)

## 2014-10-10 LAB — MAGNESIUM: MAGNESIUM: 1.9 mg/dL (ref 1.5–2.5)

## 2014-10-10 LAB — PHOSPHORUS: Phosphorus: 2.9 mg/dL (ref 2.3–4.6)

## 2014-10-10 MED ORDER — MIDAZOLAM HCL 2 MG/2ML IJ SOLN
1.0000 mg | INTRAMUSCULAR | Status: DC | PRN
  Administered 2014-10-10 (×2): 1 mg via INTRAVENOUS
  Filled 2014-10-10 (×2): qty 2

## 2014-10-10 MED ORDER — LIDOCAINE HCL (CARDIAC) 20 MG/ML IV SOLN
INTRAVENOUS | Status: AC
  Filled 2014-10-10: qty 5

## 2014-10-10 MED ORDER — CHLORHEXIDINE GLUCONATE 0.12 % MT SOLN
15.0000 mL | Freq: Two times a day (BID) | OROMUCOSAL | Status: DC
  Administered 2014-10-10 – 2014-10-16 (×12): 15 mL via OROMUCOSAL
  Filled 2014-10-10 (×11): qty 15

## 2014-10-10 MED ORDER — MIDAZOLAM HCL 2 MG/2ML IJ SOLN
INTRAMUSCULAR | Status: AC
Start: 2014-10-10 — End: 2014-10-10
  Administered 2014-10-10: 2 mg
  Filled 2014-10-10: qty 4

## 2014-10-10 MED ORDER — FUROSEMIDE 10 MG/ML IJ SOLN
40.0000 mg | Freq: Two times a day (BID) | INTRAMUSCULAR | Status: AC
  Administered 2014-10-10 (×2): 40 mg via INTRAVENOUS
  Filled 2014-10-10 (×2): qty 4

## 2014-10-10 MED ORDER — SODIUM CHLORIDE 0.9 % IV BOLUS (SEPSIS)
500.0000 mL | Freq: Once | INTRAVENOUS | Status: AC
  Administered 2014-10-10: 500 mL via INTRAVENOUS

## 2014-10-10 MED ORDER — ETOMIDATE 2 MG/ML IV SOLN
INTRAVENOUS | Status: AC
  Administered 2014-10-10: 20 mg
  Filled 2014-10-10: qty 20

## 2014-10-10 MED ORDER — FENTANYL CITRATE 0.05 MG/ML IJ SOLN
50.0000 ug | INTRAMUSCULAR | Status: DC | PRN
  Administered 2014-10-10: 50 ug via INTRAVENOUS
  Filled 2014-10-10: qty 2

## 2014-10-10 MED ORDER — SODIUM CHLORIDE 0.9 % IV SOLN
25.0000 ug/h | INTRAVENOUS | Status: DC
  Administered 2014-10-10: 100 ug/h via INTRAVENOUS
  Administered 2014-10-11: 50 ug/h via INTRAVENOUS
  Administered 2014-10-11: 100 ug/h via INTRAVENOUS
  Administered 2014-10-12: 125 ug/h via INTRAVENOUS
  Administered 2014-10-13 – 2014-10-14 (×2): 100 ug/h via INTRAVENOUS
  Administered 2014-10-14: 200 ug/h via INTRAVENOUS
  Filled 2014-10-10 (×6): qty 50

## 2014-10-10 MED ORDER — FENTANYL CITRATE 0.05 MG/ML IJ SOLN
INTRAMUSCULAR | Status: AC
  Administered 2014-10-10: 100 ug
  Filled 2014-10-10: qty 8

## 2014-10-10 MED ORDER — SUCCINYLCHOLINE CHLORIDE 20 MG/ML IJ SOLN
INTRAMUSCULAR | Status: AC
  Filled 2014-10-10: qty 1

## 2014-10-10 MED ORDER — SODIUM CHLORIDE 0.9 % IV SOLN
0.0000 mg/h | INTRAVENOUS | Status: DC
  Administered 2014-10-10 – 2014-10-12 (×4): 2 mg/h via INTRAVENOUS
  Filled 2014-10-10 (×3): qty 10

## 2014-10-10 MED ORDER — SODIUM CHLORIDE 0.9 % IV SOLN
250.0000 mL | INTRAVENOUS | Status: DC
  Administered 2014-10-10: 250 mL via INTRAVENOUS

## 2014-10-10 MED ORDER — MIDAZOLAM HCL 2 MG/2ML IJ SOLN
1.0000 mg | INTRAMUSCULAR | Status: DC | PRN
Start: 2014-10-10 — End: 2014-10-13

## 2014-10-10 MED ORDER — CETYLPYRIDINIUM CHLORIDE 0.05 % MT LIQD
7.0000 mL | Freq: Four times a day (QID) | OROMUCOSAL | Status: DC
  Administered 2014-10-11 – 2014-10-16 (×21): 7 mL via OROMUCOSAL

## 2014-10-10 MED ORDER — ROCURONIUM BROMIDE 50 MG/5ML IV SOLN
INTRAVENOUS | Status: AC
  Administered 2014-10-10: 5 mg
  Filled 2014-10-10: qty 2

## 2014-10-10 NOTE — Progress Notes (Signed)
PT Cancellation Note  Patient Details Name: Philip [name] MRN: [id number] DOB: 05/22/1946   Cancelled Treatment:    Reason Eval/Treat Not Completed: Medical issues which prohibited therapy   [name], [name] 10/10/2014, 1:58 PM [name], PT Pager: [phone] 10/10/2014

## 2014-10-10 NOTE — Progress Notes (Signed)
OT Cancellation Note  Patient Details Name: Frederica Kusterrthur L Roycroft MRN: [id number] DOB: 10/22/1946   Cancelled Treatment:    Reason Eval/Treat Not Completed: Medical issues which prohibited therapy Noted pt in distress per RN with increased HR.  Dorena BodoREDDING, Marcel Gary D Lori San MiguelRedding, [place]OT [phone] 10/10/2014, 10:52 AM

## 2014-10-10 NOTE — Progress Notes (Signed)
Pt stated he was getting tired from increased work of breathing.  Pt placed back on BiPAP

## 2014-10-10 NOTE — Progress Notes (Signed)
Patient ID: Philip [name], male   DOB: 12/04/1946, 68 y.o.   MRN: [id number]  abg reviewed Reduce MV, fio2 Repeat ab in 1 hr D/w RT  Mcarthur Rossettianiel J. Tyson AliasFeinstein, MD, FACP Pgr: [phone] [place] [facility]

## 2014-10-10 NOTE — Procedures (Signed)
Intubation Procedure Note Philip [name] [id number] 08/11/1946  Procedure: Intubation Indications: Respiratory insufficiency  Procedure Details Consent: Risks of procedure as well as the alternatives and risks of each were explained to the (patient/caregiver).  Consent for procedure obtained. Time Out: Verified patient identification, verified procedure, site/side was marked, verified correct patient position, special equipment/implants available, medications/allergies/relevent history reviewed, required imaging and test results available.  Performed  MAC and 3 Medications:  Fentanyl 100 mcg Etomidate 20 mg Versed 2 mg NMB 50 mg   Evaluation Hemodynamic Status: BP stable throughout; O2 sats: stable throughout Patient's Current Condition: stable Complications: No apparent complications Patient did tolerate procedure well. Chest X-ray ordered to verify placement.  CXR: pending.   Brett CanalesSteve Armonie Staten ACNP [facility] PollackLe Bauer PCCM Pager [phone] till 3 pm If no answer page [phone] 10/10/2014, 12:24 PM

## 2014-10-10 NOTE — Progress Notes (Addendum)
Patient intubated. Sister Talbert Forest([name]) has been notified and updated via phone by Antony OdeaGayle Mueller

## 2014-10-10 NOTE — Progress Notes (Signed)
Spiritual care following due to recent intubation.  No family present at this time.  Will continue to follow for support.  Please page if needs arise.    Belva CromeStalnaker, Eva Griffo Wayne MDiv

## 2014-10-10 NOTE — Progress Notes (Signed)
Patient intubated and family contacted. When family calls back or visits, will follow-up about advance directive and able to bring copy to unit.

## 2014-10-10 NOTE — Progress Notes (Signed)
Vent changes per Dr Tyson AliasFeinstein

## 2014-10-10 NOTE — Procedures (Signed)
STAFF MD note  - personally supervised intubation at bedside.   Dr. Kalman ShanMurali Davey Limas, M.D., [facility] Center[facility].C.C.P Pulmonary and Critical Care Medicine Staff Physician [facility] [place] [facility] Pager: [phone], If no answer or between  15:00h - 7:00h: call [phone]  10/10/2014 12:33 PM

## 2014-10-10 NOTE — Progress Notes (Signed)
Patient agitated, making comments around not wanting to wear mask anymore and being tired. Paged Helmut MusterAlicia with Palliative Care about updates. Helmut Musterlicia currently at bedside.

## 2014-10-10 NOTE — Progress Notes (Signed)
Patient received: 20 mg Etomidate, 100 mcg fentanyl, 2 mg versed, and 5 mg rocuronium for intubation. Vital signs cycling q632mins, OG in, temp foley in. Current vitals reading HR 106, O2 100%, RR 16, and BP 155/93 MAP of 112. Patient does not appear in any distress at this time.

## 2014-10-10 NOTE — Progress Notes (Signed)
[name] tells me he is "tired" and he appears frustrated that he continues to feel worse and does not like wearing the BiPAP. I acknowledged these feelings and told him that he does not have to wear the mask but that he has to realize we are doing everything we can to help his respiratory status but that this is not improving and is unlikely to improve. I explained that he has choices but has to realize the consequences of his choices. I encouraged him to let us know if he is tired of all this and wants us to re-focus on his comfort and quality of life - he tells me no that he continues to want everything done even though we do not believe these things will improve his quality of life and may be causing more harm than benefit. He tells me he is tired of talking and wants to rest - I will follow up again this afternoon with his permission.   Philip ChannelAlicia Husayn Reim, NP Palliative Medicine Team Pager # [phone] (M-F 8a-5p) Team Phone # [phone] (Nights/Weekends)

## 2014-10-10 NOTE — Progress Notes (Signed)
PULMONARY / CRITICAL CARE MEDICINE   Name: Philip [name] MRN: [id number] DOB: Mar 13, 1946  PCP Jerlyn Ly, MD    ADMISSION DATE:  09/24/2014 CONSULTATION DATE:  10/10/2014  REFERRING MD :  EDP  CHIEF COMPLAINT:  SOB  INITIAL PRESENTATION: [name] brought to [facility] ED on 10/6 with SOB, cough, dizziness.  In ED, CXR suggestive of PNA.  He failed BiPAP and required intubation, PCCM consulted for admission.  STUDIES:  CXR 10/6 >> vascular congestion, b/l pleural effusions, RLL atx vs infiltrate. TTE 10/7 >> EF 60-65%, mod concentric hypertrophy, doppler parameters c/w gd I diastolic dysfxn   SIGNIFICANT EVENTS: 10/6  presented to ED, started on BiPAP but failed, required intubation. 10/7  ETT repositioned with poor volume return on vent, to 25 cm at teeth 10/8. Passed SBT, fully awake. Extubated.  10/9 hypertensive. Very congested cough, but no distress.  10/11: desaturations overnight remains on bipap 10/12: patient placed on bipap for increased WOB/RR. Patient reports distress. Using biPAP throughout day.  10/13: patient used biPAP overnight 10/14: up in chair. Looking better. PT and OT suggesting SNF.  10/15 FIO2 needs increased. Feels worse.  10/16: persistent low volumes  10/04/14 No sig clinical change. Feels about the same  10/05/14: Per  RN: refusing meds and thismight be a change. Was in chair yesterday but today in bed and? A bit more sleepy Denies any complaints. But was seen retching for several mintues  10/19 On NIMVS overnight. Try off nimvs and check abg 10/19 met with palliative care and is a partial code, no intubation but ok for cpr, shock, chemicals.  10/20 used nimvs during the night. 10/20 full code per his request via palliative care follow up. 10/22 increased fio2 needs. Looks weaker. Will attempt aggressive diuresis .  SUBJECTIVE/OVERNIGHT/INTERVAL HX 10/22 HR decreased low 100's, day 1 of resumption BB. Looks weaker. Poor resp effort. May need intubation  soon.  VITAL SIGNS: Temp:  [97.8 F (36.6 C)-98.7 F (37.1 C)] 98.4 F (36.9 C) (10/22 0400) Pulse Rate:  [61-128] 100 (10/22 0610) Resp:  [13-42] 33 (10/22 0610) BP: (114-168)/(50-86) 168/81 mmHg (10/22 0610) SpO2:  [87 %-98 %] 94 % (10/22 0610) FiO2 (%):  [40 %-55 %] 55 % (10/22 0400) Weight:  [202 lb 2.6 oz (91.7 kg)] 202 lb 2.6 oz (91.7 kg) (10/22 0400) Vent Mode:  [-]  FiO2 (%):  [40 %-55 %] 55 %  INTAKE / OUTPUT: Intake/Output     10/21 0701 - 10/22 0700 10/22 0701 - 10/23 0700   P.O. 700    I.V. (mL/kg) 800 (8.7)    IV Piggyback 200    Total Intake(mL/kg) 1700 (18.5)    Urine (mL/kg/hr) 3475 (1.6)    Stool 510 (0.2)    Total Output 3985     Net -2285          Urine Occurrence 1 x      PHYSICAL EXAMINATION: General: adult male . Off NIMVS since 0300. Looks weaker and with poor resp effort. Neuro: awake,weak, barely moving air HEENT: No JVD.  Cardiovascular: RRR, no M/R/G. 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 No co abd pain Musculoskeletal: No gross deformities, mild edema.  Skin: Intact, warm, dry  LABS: PULMONARY  Recent Labs Lab 10/05/14 1228 10/06/14 1226 10/07/14 1038  PHART 7.345* 7.395 7.383  PCO2ART 65.1* 57.3* 54.5*  PO2ART 56.6* 58.0* 67.6*  HCO3 34.6* 34.3* 31.8*  TCO2 30.0 29.7 27.9  O2SAT 89.0 90.2 92.8    CBC  Recent Labs Lab 10/08/14 0355 10/09/14 0322 10/10/14 0328  HGB 14.3 13.9 13.8  HCT 44.6 43.2 44.0  WBC 13.8* 15.1* 13.0*  PLT 123* 125* 111*    COAGULATION No results found for this basename: INR,  in the last 168 hours  CARDIAC  No results found for this basename: TROPONINI,  in the last 168 hours  Recent Labs Lab 10/07/14 0340  PROBNP 314.5*     CHEMISTRY  Recent Labs Lab 10/05/14 0355 10/06/14 0420 10/07/14 0340 10/08/14 0355 10/09/14 0322 10/10/14 0328  NA 144  --  136* 139 139 139  K  5.2  --  5.2 4.3 4.7 4.0  CL 101  --  95* 97 98 96  CO2 35*  --  31 32 31 35*  GLUCOSE 129*  --  67* 88 103* 89  BUN 31*  --  _0 CREATININE 1.17  --  0.95 0.96 1.16 1.04  CALCIUM 8.9  --  8.8 9.1 8.8 8.9  MG  --  1.8 2.0 1.7 1.6 1.9  PHOS  --  2.5 2.2* 1.6* 3.3 2.9   Estimated Creatinine Clearance: 76.3 ml/min (by C-G formula based on Cr of 1.04).   LIVER  Recent Labs Lab 10/04/14 0320 10/05/14 0355  AST 21 19  ALT 32 29  ALKPHOS 53 47  BILITOT 0.4 0.5  PROT 6.8 6.3  ALBUMIN 3.0* 2.8*     INFECTIOUS No results found for this basename: LATICACIDVEN, PROCALCITON,  in the last 168 hours   ENDOCRINE CBG (last 3)  No results found for this basename: GLUCAP,  in the last 72 hours       IMAGING x48h Dg Chest Port 1 View  10/10/2014   CLINICAL DATA:  Respiratory failure. Intubated patient. Community acquired pneumonia.  EXAM: PORTABLE CHEST - 1 VIEW  COMPARISON:  Single view of the chest 10/09/2014 and 10/08/2014. CT chest 10/04/2014.  FINDINGS: Lung volumes remain low with basilar airspace disease, worse on the left. The appearance is not markedly changed. No pneumothorax or pleural effusion is identified. Cardiomegaly is noted.  IMPRESSION: No marked change in the appearance of a low volume chest with basilar airspace disease, likely atelectasis.   Electronically Signed   By: Inge Rise M.D.   On: 10/10/2014 07:34   Dg Chest Port 1 View  10/09/2014   CLINICAL DATA:  Respiratory failure  EXAM: PORTABLE CHEST - 1 VIEW  COMPARISON:  10/08/2014  FINDINGS: Persistent very low lung volumes with basilar atelectasis. Small pleural effusion noted. Mild cardiac enlargement without definite edema or CHF. No pneumothorax. Trachea is midline. Degenerative changes of the spine. Monitor leads overlie the chest.  IMPRESSION: No change in very low lung volumes with bibasilar atelectasis and a small right effusion.   Electronically Signed   By: Daryll Brod M.D.   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 ? Mucous plugging, possibly element of effusion, but ATX the larger component .  - 10/21:requires bipap nocturnally. He has documented OSA.  Poor pulmonary reserves -10/22 much weaker P:    BiPAP qhs and prn. He is a  full code per his request as of 10/20 DuoNebs / PRN Albuterol. Flutter. Cont prednisone 20 mg  Continue brovana and budesonide  nasal hygiene rx  Mobilize if able(ot/PT report limited ability to assist in mobility  10/22 aggressive diuresis as tolerated but he may require intubation  CARDIOVASCULAR A:  Hx HTN, HLD - BNP, lactate, cortisol, troponin wnl HTN   - tachycardic after dc coreg on 10./19/15, better 10/22 with bb restarted P:  pravachol home meds.  Continue home norvasc, cardura, lotensin PRN hydralazine for BP >160 Started b1 specific lopressor in view of tachycardia 10/21   RENAL Lab Results  Component Value Date   CREATININE 1.04 10/10/2014   CREATININE 1.16 10/09/2014   CREATININE 0.96 10/08/2014    Recent Labs Lab 10/08/14 0355 10/09/14 0322 10/10/14 0328  NA 139 139 139     A:   Acute kidney injury better -- hypernatremia resolved > per his PCP this is typical for him when he gets dehydrated w/ his colostomy. Looks better s/p IVFs   P:     IVFs to  10cc/hr BMP daily in icu. Aggressive diuresis as tolerated  GASTROINTESTINAL A:   GI prophylaxis Nutrition   -  mild low mag 10/06/14, resolved 10/19  - mild low phos 10/07/14 +resolved  P:   reglan prn On dys 3 diet as tolerated Colostomy care  HEMATOLOGIC A:   VTE Prophylaxis Polycythemia, likely secondary to respiratory disease P:  SCD's / Lovenox. CBC daily in  icu  INFECTIOUS A:   Leukocytosis CAP - left consolidation: rhinovirus w/ prob bacterial superinfection  Enterococcus UTI  P:   BCx2 10/7 >> neg  UCx 10/7 >> enterococcus (amp sens) RVP 10/7 >> + rhinovirus   Changed to oral levaquin 10/9>>>10/15 Consider dc vanco 10/19 Vanc 10/15>>>10/20 dc no indication ceftaz 10/15>>> Consider dc all abx 10/22  ENDOCRINE   A:   DM   P:   SSI  Hold outpatient metformin.  NEUROLOGIC A:   Acute metabolic encephalopathy-->resolved.  Deconditioning.      - confusion resolved 10/06/14 after going on bipap 10/05/14, alert 10/20 - weak and sleepy 10/22 may be hypercarbic P:   Physical therapy to assess, please see notes Monitor closely  FAMILY Updates:  None at bedside 10/21   Palliative care meet:  Full code on 10/20. Unfortunately the major driving force is resp failure .   TODAY'S STAFF MD SUMMARY:  NIMVS as needed. Try aggressive diuresis. Advance diet as to dys 3.  Full code per his request. Continue nocturnal and prn NIMVS. Back on BB. I suspect he will be intubated soon if diuresis doesn't help.  Richardson Landry Minor ACNP Maryanna Shape PCCM Pager [phone] till 3 pm If no answer page [phone] 10/10/2014, 8:07 AM         STAFF NOTE: I, Dr Ann Lions have personally reviewed patient's available data, including medical history, events of note, physical examination and test results as part of my evaluation. I have discussed with resident/NP and other care providers such as pharmacist, RN and RRT.  In addition,  I personally evaluated patient and elicited key findings of acute on chronic respiratoy failure. He is unable to sustain spontaneous ventilation and is mostly bipap dependent. So we reintubated him. Will need to continue to pursue goals of care. If he does not wean off vent, LTAC/trach v comfort discussion needed by palliative care.  Rest per NP/medical resident whose note is outlined above and that I agree with  The patient  is  critically ill with multiple organ systems failure and requires high complexity decision making for assessment and support, frequent evaluation and titration of therapies, application of advanced monitoring technologies and extensive interpretation of multiple databases.   Critical Care Time devoted to patient care services described in this note is  30  Minutes. This time reflects time of care of this signee Dr Brand Males. This critical care time does not reflect procedure time, or teaching time or supervisory time of PA/NP/Med student/Med Resident etc but could involve care discussion time    Dr. Brand Males, M.D., [facility].C.P Pulmonary and Critical Care Medicine Staff Physician [place] [facility] Pager: [phone], If no answer or between  15:00h - 7:00h: call [phone]  10/10/2014 12:32 PM

## 2014-10-10 NOTE — Progress Notes (Signed)
Patient continues to be in distress, HR 140-150's, O2 at 92% on Bipap, BP 166/68. Patient appearing very fatigue. Brett CanalesSteve Minor at bedside and aware of status. No new orders placed. Continuing to monitor.

## 2014-10-10 NOTE — Progress Notes (Signed)
Called to room by RN, Pt having trouble with BiPAP mask.  Upon entering the room the Pt had removed his BiPAP mask.  Pt stated he felt fine and was very alert and mentating well.  Pt placed on venturi mask until BiPAP is needed again.

## 2014-10-11 ENCOUNTER — Inpatient Hospital Stay (HOSPITAL_COMMUNITY): Payer: Medicare Other

## 2014-10-11 LAB — CBC
HCT: 44.9 % (ref 39.0–52.0)
Hemoglobin: 14.3 g/dL (ref 13.0–17.0)
MCH: 29.1 pg (ref 26.0–34.0)
MCHC: 31.8 g/dL (ref 30.0–36.0)
MCV: 91.3 fL (ref 78.0–100.0)
PLATELETS: 116 10*3/uL — AB (ref 150–400)
RBC: 4.92 MIL/uL (ref 4.22–5.81)
RDW: 13.6 % (ref 11.5–15.5)
WBC: 14 10*3/uL — AB (ref 4.0–10.5)

## 2014-10-11 LAB — BASIC METABOLIC PANEL
Anion gap: 11 (ref 5–15)
BUN: 14 mg/dL (ref 6–23)
CHLORIDE: 93 meq/L — AB (ref 96–112)
CO2: 32 mEq/L (ref 19–32)
CREATININE: 1.02 mg/dL (ref 0.50–1.35)
Calcium: 9.3 mg/dL (ref 8.4–10.5)
GFR calc non Af Amer: 73 mL/min — ABNORMAL LOW (ref >90)
GFR, EST AFRICAN AMERICAN: 85 mL/min — AB (ref >90)
Glucose, Bld: 87 mg/dL (ref 70–99)
POTASSIUM: 4.3 meq/L (ref 3.7–5.3)
Sodium: 136 mEq/L — ABNORMAL LOW (ref 137–147)

## 2014-10-11 LAB — PHOSPHORUS: Phosphorus: 3 mg/dL (ref 2.3–4.6)

## 2014-10-11 LAB — MAGNESIUM: MAGNESIUM: 1.6 mg/dL (ref 1.5–2.5)

## 2014-10-11 MED ORDER — METOPROLOL TARTRATE 25 MG PO TABS
25.0000 mg | ORAL_TABLET | Freq: Two times a day (BID) | ORAL | Status: DC
  Administered 2014-10-11 – 2014-10-16 (×11): 25 mg via ORAL
  Filled 2014-10-11 (×11): qty 1

## 2014-10-11 MED ORDER — JEVITY 1.2 CAL PO LIQD
1000.0000 mL | ORAL | Status: DC
  Administered 2014-10-11: 1000 mL
  Filled 2014-10-11 (×2): qty 1000

## 2014-10-11 MED ORDER — MAGNESIUM SULFATE 40 MG/ML IJ SOLN
2.0000 g | Freq: Once | INTRAMUSCULAR | Status: AC
Start: 2014-10-11 — End: 2014-10-11
  Administered 2014-10-11: 2 g via INTRAVENOUS
  Filled 2014-10-11: qty 50

## 2014-10-11 MED ORDER — POTASSIUM CHLORIDE 20 MEQ/15ML (10%) PO LIQD
30.0000 meq | Freq: Two times a day (BID) | ORAL | Status: DC
  Administered 2014-10-11 – 2014-10-14 (×6): 30 meq
  Filled 2014-10-11 (×6): qty 30

## 2014-10-11 NOTE — Progress Notes (Signed)
NUTRITION FOLLOW UP  Intervention:   -Initiate Vital AF 1.2 @ 20 ml/hr via OGT and increase by 10 ml every 4 hours to goal rate of 60 ml/hr.  -Tube feeding regimen provides 1728 kcal (92% of needs), 108 grams of protein, and 1167 ml of H2O.  -RD to continue to monitor  Nutrition Dx:   Inadequate oral intake related to inability to eat as evidenced by NPO status - no long appropriate, diet advanced  New nutrition dx: Increased nutrient needs related to community acquired pneumonia and COPD as evidenced by MD notes.   Goal:  Pt to meet >/= 90% of their estimated nutrition needs   Monitor:   Weights, labs, intake, renal profile  Assessment:   [name] brought to [facility] ED on 10/6 with SOB, cough, dizziness. In ED, CXR suggestive of PNA. He failed BiPAP and required intubation.   10/7: - Per weight history documentation, pt weight has been stable.  - Per family, pt has not been eating well x 3 weeks d/t decreased appetite. Family states that pt may only eat a bowl of cereal all day, if eating at all. Cough and symptoms appeared around 2 weeks ago.  10/8: - Extubated this morning, diet advanced to diabetic diet, pt eating 100% of meals - Met with pt who reports not eating much of anything since Monday PTA due to not feeling well however before then had excellent appetite and was eating 3 meals/day with blood sugars under control   10/15: -Pt reported decreased appetite for past several days. Noted chest congestion and Bipap use inhibits PO intake ability to eat. Denied nausea or abd pain. -Consuming < 50% of meals -Was willing to trial use of Ensure supplements for nutrient replenishment  -AKI r/t dehydration from colostomy. NS increased.   -Phos/Mg elevated on 10/12, K mildly elevated at 5.4. Diet restriction not warranted at this time as pt with decreased intake and would benefit from liberalized options  10/23: -PO intake varied from 0-75-100% per RN documentation. -Low phos and Mg  resolved -Has OGT placement, Jevity 1.2 ordered. -Patient is currently intubated on ventilator support on 10/22 MV: 6.2 L/min Temp (24hrs), Avg:99 F (37.2 C), Min:97.9 F (36.6 C), Max:100.4 F (38 C)  Propofol: 0 ml/hr  Height: Ht Readings from Last 1 Encounters:  09/24/14 _0  (1.778 m)    Weight Status:   Wt Readings from Last 1 Encounters:  10/11/14 185 lb 13.6 oz (84.3 kg)  Admit wt         205 lb (92.9 kg)   Re-estimated needs:  Kcal: 1874 Protein: 90-110g Fluid: 1.9-2.1L/day   Skin: intact   Diet Order: NPO   Intake/Output Summary (Last 24 hours) at 10/11/14 1554 Last data filed at 10/11/14 1009  Gross per 24 hour  Intake 563.75 ml  Output   2500 ml  Net -1936.25 ml    Last BM: 10/22   Labs:   Recent Labs Lab 10/09/14 0322 10/10/14 0328 10/11/14 0335  NA 139 139 136*  K 4.7 4.0 4.3  CL 98 96 93*  CO2 31 35* 32  BUN _1 CREATININE 1.16 1.04 1.02  CALCIUM 8.8 8.9 9.3  MG 1.6 1.9 1.6  PHOS 3.3 2.9 3.0  GLUCOSE 103* 89 87    CBG (last 3)  No results found for this basename: GLUCAP,  in the last 72 hours  Scheduled Meds: . amLODipine  10 mg Oral Daily  . antiseptic oral rinse  7 mL Mouth Rinse QID  . cefTAZidime (FORTAZ)  IV  2 g Intravenous Q8H  . chlorhexidine  15 mL Mouth Rinse BID  . cloNIDine  0.1 mg Oral TID  . docusate sodium  100 mg Oral Daily  . doxazosin  2 mg Oral Daily  . enoxaparin (LOVENOX) injection  40 mg Subcutaneous Q24H  . fluticasone  2 spray Each Nare BID  . guaiFENesin  1,200 mg Oral BID  . Influenza vac split quadrivalent PF  0.5 mL Intramuscular Tomorrow-1000  . ipratropium-albuterol  3 mL Nebulization Q6H  . metoprolol tartrate  25 mg Oral BID  . pantoprazole  40 mg Oral Q1200  . potassium chloride  30 mEq Per Tube BID  . pravastatin  20 mg Oral q1800  . predniSONE  20 mg Oral Q breakfast   Atlee Abide Loomis RD LDN Clinical Dietitian [phone]

## 2014-10-11 NOTE — Progress Notes (Signed)
PT Cancellation Note  Patient Details Name: Philip [name] MRN: [id number] DOB: 07/09/1946   Cancelled Treatment:    Reason Eval/Treat Not Completed: Medical issues which prohibited therapy  Pt intubated 10/22.  Will await re-order to continue PT so please re-order if/when appropriate.   Silvana Holecek,KATHrine E 10/11/2014, 1:46 PM Zenovia JarredKati Daesha Insco, PT, DPT 10/11/2014 Pager: [phone]

## 2014-10-11 NOTE — Progress Notes (Signed)
[facility]  Subjective: I spoke with his sister, Talbert ForestShirley, via telephone and updated that there has not been much change or improvement in his status. She confirms no tracheostomy/long term ventilation. She tells me that she does not want him to suffer and that "he has suffered enough." With that said we discussed further ACLS/CPR and she says that she does not want that for him and I support that decision. Family understands that his lung status is not going to improve and that the outcome is not likely to be good for him. They agree to meet again with me Monday to discuss one way extubation and likely need for comfort care. Also updated sister, Dennie Bibleat, at bedside. I will follow up on Monday.  Objective: No Known Allergies Scheduled Meds: . amLODipine  10 mg Oral Daily  . antiseptic oral rinse  7 mL Mouth Rinse QID  . cefTAZidime (FORTAZ)  IV  2 g Intravenous Q8H  . chlorhexidine  15 mL Mouth Rinse BID  . cloNIDine  0.1 mg Oral TID  . docusate sodium  100 mg Oral Daily  . doxazosin  2 mg Oral Daily  . enoxaparin (LOVENOX) injection  40 mg Subcutaneous Q24H  . fluticasone  2 spray Each Nare BID  . guaiFENesin  1,200 mg Oral BID  . Influenza vac split quadrivalent PF  0.5 mL Intramuscular Tomorrow-1000  . ipratropium-albuterol  3 mL Nebulization Q6H  . metoprolol tartrate  25 mg Oral BID  . pantoprazole  40 mg Oral Q1200  . potassium chloride  30 mEq Per Tube BID  . pravastatin  20 mg Oral q1800  . predniSONE  20 mg Oral Q breakfast   Continuous Infusions: . sodium chloride 250 mL (10/10/14 0800)  . feeding supplement (JEVITY 1.2 CAL)    . fentaNYL infusion INTRAVENOUS 50 mcg/hr (10/11/14 1032)  . midazolam (VERSED) infusion 1 mg/hr (10/11/14 1033)   PRN Meds:.fentaNYL, hydrALAZINE, midazolam, midazolam  BP 96/61  Pulse 98  Temp(Src) 99.5 F (37.5 C) (Core (Comment))  Resp 12  Ht 5\' 10"  (1.778 m)  Wt 84.3 kg (185 lb 13.6 oz)  BMI  26.67 kg/m2  SpO2 96%   PPS: 10%   Intake/Output Summary (Last 24 hours) at 10/11/14 1423 Last data filed at 10/11/14 1009  Gross per 24 hour  Intake 585.75 ml  Output   2500 ml  Net -1914.25 ml      LBM: 10/22     Physical Exam:  General: NAD, sedated on vent HEENT: ETT, no JVD, moist mucous membranes without exudate Chest: Good air movement, symmetric CVS: Tachycardic on monitor Abdomen: Soft, NT, ND, colostomy Ext: BLE edema improved, warm to touch Neuro: Sedated on vent, easily arouses to voice  Labs: CBC    Component Value Date/Time   WBC 14.0* 10/11/2014 0335   RBC 4.92 10/11/2014 0335   HGB 14.3 10/11/2014 0335   HCT 44.9 10/11/2014 0335   PLT 116* 10/11/2014 0335   MCV 91.3 10/11/2014 0335   MCH 29.1 10/11/2014 0335   MCHC 31.8 10/11/2014 0335   RDW 13.6 10/11/2014 0335   LYMPHSABS 1.6 10/10/2014 0328   MONOABS 0.4 10/10/2014 0328   EOSABS 0.1 10/10/2014 0328   BASOSABS 0.0 10/10/2014 0328    BMET    Component Value Date/Time   NA 136* 10/11/2014 0335   K 4.3 10/11/2014 0335   CL 93* 10/11/2014 0335   CO2 32 10/11/2014 0335   GLUCOSE 87  10/11/2014 0335   BUN 14 10/11/2014 0335   CREATININE 1.02 10/11/2014 0335   CALCIUM 9.3 10/11/2014 0335   GFRNONAA 73* 10/11/2014 0335   GFRAA 85* 10/11/2014 0335    CMP     Component Value Date/Time   NA 136* 10/11/2014 0335   K 4.3 10/11/2014 0335   CL 93* 10/11/2014 0335   CO2 32 10/11/2014 0335   GLUCOSE 87 10/11/2014 0335   BUN 14 10/11/2014 0335   CREATININE 1.02 10/11/2014 0335   CALCIUM 9.3 10/11/2014 0335   PROT 6.3 10/05/2014 0355   ALBUMIN 2.8* 10/05/2014 0355   AST 19 10/05/2014 0355   ALT 29 10/05/2014 0355   ALKPHOS 47 10/05/2014 0355   BILITOT 0.5 10/05/2014 0355   GFRNONAA 73* 10/11/2014 0335   GFRAA 85* 10/11/2014 0335    Assessment and Plan: 1. Code Status: Maintain current intubation. NO CPR/shock/ACLS.  2. Symptom Control: 1. Pain: Titrated fentanyl infusion while  intubated. 2. Anxiety: Titrated midazolam infusion while intubated.  3. Shortness of breath: Managed on mechanical ventilation.  3. Psycho/Social: Emotional support provided to family at bedside and via telephone.  4. Spiritual: His sister Pat's husband is a Education officer, environmentalpastor and supports patient and family.  5. Disposition: To be determined on outcomes.     Time In Time Out Total Time Spent with Patient Total Overall Time  1400 1430 20min 30min    Greater than 50%  of this time was spent counseling and coordinating care related to the above assessment and plan.  Yong ChannelAlicia Tlaloc Taddei, NP Palliative Medicine Team Pager # [phone] (M-F 8a-5p) Team Phone # [phone] (Nights/Weekends)

## 2014-10-11 NOTE — Progress Notes (Signed)
OT Cancellation Note  Patient Details Name: Frederica Kusterrthur L Martinez MRN: [id number] DOB: 06/22/1946   Cancelled Treatment:    Reason Eval/Treat Not Completed: Other (comment).  Pt's status has declined.  If status improves and he will benefit from OT, please reorder.  Andrus Sharp 10/11/2014, 4:39 PM Marica OtterMaryellen Daleiza Bacchi, OTR/L [phone] 10/11/2014

## 2014-10-11 NOTE — Progress Notes (Addendum)
CSW continuing to follow.  CSW noted that pt re-intubated 10/22.  CSW noted palliative remains involved in pt care.  CSW visited pt room. No family present at bedside.  CSW will continue to follow pt progress and assist with pt disposition needs as appropriate.  Loletta SpecterSuzanna Kidd, MSW, LCSW Clinical Social Work [phone]

## 2014-10-11 NOTE — Progress Notes (Signed)
PULMONARY / CRITICAL CARE MEDICINE   Name: Philip [name] MRN: [id number] DOB: 12/12/1946  PCP Jerlyn Ly, MD    ADMISSION DATE:  09/24/2014 CONSULTATION DATE:  10/11/2014  REFERRING MD :  EDP  CHIEF COMPLAINT:  SOB  INITIAL PRESENTATION: [name] brought to [facility] ED on 10/6 with SOB, cough, dizziness.  In ED, CXR suggestive of PNA.  He failed BiPAP and required intubation, PCCM consulted for admission.  STUDIES:  CXR 10/6 >> vascular congestion, b/l pleural effusions, RLL atx vs infiltrate. TTE 10/7 >> EF 60-65%, mod concentric hypertrophy, doppler parameters c/w gd I diastolic dysfxn   SIGNIFICANT EVENTS: 10/6  presented to ED, started on BiPAP but failed, required intubation. 10/7  ETT repositioned with poor volume return on vent, to 25 cm at teeth 10/8. Passed SBT, fully awake. Extubated.  10/9 hypertensive. Very congested cough, but no distress.  10/11: desaturations overnight remains on bipap 10/12: patient placed on bipap for increased WOB/RR. Patient reports distress. Using biPAP throughout day.  10/13: patient used biPAP overnight 10/14: up in chair. Looking better. PT and OT suggesting SNF.  10/15 FIO2 needs increased. Feels worse.  10/16: persistent low volumes  10/04/14 No sig clinical change. Feels about the same  10/05/14: Per  RN: refusing meds and thismight be a change. Was in chair yesterday but today in bed and? A bit more sleepy Denies any complaints. But was seen retching for several mintues  10/19 On NIMVS overnight. Try off nimvs and check abg 10/19 met with palliative care and is a partial code, no intubation but ok for cpr, shock, chemicals.  10/20 used nimvs during the night. 10/20 full code per his request via palliative care follow up. 10/22 increased fio2 needs. Looks weaker. Will attempt aggressive diuresis . 10/22 required intubation for progressive resp failure.  SUBJECTIVE/OVERNIGHT/INTERVAL HX 10/23 day 1 intubated. ST 130's. Arouses to voice  and follows commands. Maintaining negative i/o.  VITAL SIGNS: Temp:  [97.9 F (36.6 C)-99 F (37.2 C)] 99 F (37.2 C) (10/23 0800) Pulse Rate:  [76-145] 91 (10/23 0800) Resp:  [12-39] 12 (10/23 0800) BP: (72-185)/(56-105) 84/68 mmHg (10/23 0800) SpO2:  [93 %-100 %] 97 % (10/23 0841) FiO2 (%):  [40 %-100 %] 40 % (10/23 0842) Weight:  [185 lb 13.6 oz (84.3 kg)] 185 lb 13.6 oz (84.3 kg) (10/23 0500) Vent Mode:  [-] PRVC FiO2 (%):  [40 %-100 %] 40 % Set Rate:  [12 bmp-16 bmp] 12 bmp Vt Set:  [510 mL-580 mL] 510 mL PEEP:  [5 cmH20] 5 cmH20 Plateau Pressure:  [19 cmH20-31 cmH20] 19 cmH20  INTAKE / OUTPUT: Intake/Output     10/22 0701 - 10/23 0700 10/23 0701 - 10/24 0700   P.O.     I.V. (mL/kg) 452.8 (5.4) 44 (0.5)   IV Piggyback 150    Total Intake(mL/kg) 602.8 (7.2) 44 (0.5)   Urine (mL/kg/hr) 2900 (1.4)    Stool     Total Output 2900     Net -2297.3 +44        Urine Occurrence 1 x      PHYSICAL EXAMINATION: General: adult male . Sedated on vent Neuro: arouses to voice HEENT: No JVD. ETT-> vent Cardiovascular: RRR, no M/R/G. 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 No co abd pain. OGT , no tube feeds Musculoskeletal: No gross deformities, mild edema. (decreased)  Skin: Intact, warm, dry  LABS: PULMONARY  Recent Labs Lab 10/05/14 1228 10/06/14 1226 10/07/14 1038 10/10/14 1614 10/10/14 2028  PHART 7.345* 7.395 7.383 7.573* 7.481*  PCO2ART 65.1* 57.3* 54.5* 37.4 46.5*  PO2ART 56.6* 58.0* 67.6* 122.0* 110.0*  HCO3 34.6* 34.3* 31.8* 34.6* 34.3*  TCO2 30.0 29.7 27.9 29.2 29.6  O2SAT 89.0 90.2 92.8 99.0 98.5    CBC  Recent Labs Lab 10/09/14 0322 10/10/14 0328 10/11/14 0335  HGB 13.9 13.8 14.3  HCT 43.2 44.0 44.9  WBC 15.1* 13.0* 14.0*  PLT 125* 111* 116*    COAGULATION No results found for this basename: INR,  in the last 168  hours  CARDIAC  No results found for this basename: TROPONINI,  in the last 168 hours  Recent Labs Lab 10/07/14 0340  PROBNP 314.5*     CHEMISTRY  Recent Labs Lab 10/07/14 0340 10/08/14 0355 10/09/14 0322 10/10/14 0328 10/11/14 0335  NA 136* 139 139 139 136*  K 5.2 4.3 4.7 4.0 4.3  CL 95* 97 98 96 93*  CO2 31 32 31 35* 32  GLUCOSE 67* 88 103* 89 87  BUN _0 CREATININE 0.95 0.96 1.16 1.04 1.02  CALCIUM 8.8 9.1 8.8 8.9 9.3  MG 2.0 1.7 1.6 1.9 1.6  PHOS 2.2* 1.6* 3.3 2.9 3.0   Estimated Creatinine Clearance: 70.6 ml/min (by C-G formula based on Cr of 1.02).   LIVER  Recent Labs Lab 10/05/14 0355  AST 19  ALT 29  ALKPHOS 47  BILITOT 0.5  PROT 6.3  ALBUMIN 2.8*     INFECTIOUS No results found for this basename: LATICACIDVEN, PROCALCITON,  in the last 168 hours   ENDOCRINE CBG (last 3)  No results found for this basename: GLUCAP,  in the last 72 hours       IMAGING x48h Dg Chest Port 1 View  10/11/2014   CLINICAL DATA:  Respiratory failure.  EXAM: PORTABLE CHEST - 1 VIEW  COMPARISON:  October 10, 2014.  FINDINGS: Endotracheal tube is in grossly good position with distal tip approximately 5.6 cm above the carina. Nasogastric tube is seen passing through the esophagus into the stomach. Stable bilateral basilar opacities are noted concerning for pneumonia or atelectasis with associated pleural effusions. No pneumothorax is noted. Bony thorax appears intact.  IMPRESSION: Endotracheal and nasogastric tubes in grossly good position. Stable bilateral basilar opacities are noted concerning for pneumonia or atelectasis with associated pleural effusions.   Electronically Signed   By: Sabino Dick M.D.   On: 10/11/2014 07:29   Dg Chest Port 1 View  10/10/2014   CLINICAL DATA:  Status post intubation.  EXAM: PORTABLE CHEST - 1 VIEW  COMPARISON:  Single view of the chest 10/10/2014 4:49 a.m.  FINDINGS: New endotracheal tube is in place with the tip in  good position approximately 3.8 cm above the carina. NGT courses into the stomach and below the inferior margin of the film. Bibasilar airspace disease and small right effusion are again seen and appear slightly worsened. No pneumothorax.  IMPRESSION: Endotracheal tube and NG tube project in good position.  Small effusions and basilar airspace disease appear somewhat increased.   Electronically Signed   By: Inge Rise M.D.   On: 10/10/2014 13:17   Dg Chest Port 1 View  10/10/2014   CLINICAL DATA:  Respiratory failure. Intubated patient. Community acquired pneumonia.  EXAM: PORTABLE CHEST - 1 VIEW  COMPARISON:  Single view of the chest 10/09/2014 and 10/08/2014. CT  chest 10/04/2014.  FINDINGS: Lung volumes remain low with basilar airspace disease, worse on the left. The appearance is not markedly changed. No pneumothorax or pleural effusion is identified. Cardiomegaly is noted.  IMPRESSION: No marked change in the appearance of a low volume chest with basilar airspace disease, likely atelectasis.   Electronically Signed   By: Inge Rise M.D.   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 ? Mucous plugging, possibly element of effusion, but ATX the larger component .  - 10/21:requires bipap nocturnally. He has documented OSA.  Poor pulmonary reserves -10/22 much weaker and required intubation  P:    MVS started 10/22. He is a full code per his request as of 10/20 DuoNebs / PRN Albuterol. Flutter. Cont prednisone 20 mg  Continue brovana and budesonide  nasal hygiene rx  Mobilize if able(ot/PT report limited ability to assist in mobility  10/22 aggressive diuresis as tolerated but he may require intubation Continue  diuresis as tolerated  CARDIOVASCULAR A:  Hx HTN, HLD - BNP, lactate, cortisol, troponin wnl HTN   - tachycardic after dc coreg on 10./19/15, better 10/22 with bb restarted P:  pravachol home meds.  Continue home norvasc, cardura, lotensin PRN hydralazine for BP >160 Started b1 specific lopressor in view of tachycardia 10/21. Increase BB 10/23 Consider placing CVL to guide diuresis. Diuresis on hold for low bp.  RENAL Lab Results  Component Value Date   CREATININE 1.02 10/11/2014   CREATININE 1.04 10/10/2014   CREATININE 1.16 10/09/2014    Recent Labs Lab 10/09/14 0322 10/10/14 0328 10/11/14 0335  NA 139 139 136*     A:   Acute kidney injury better -- hypernatremia resolved > per his PCP this is typical for him when he gets dehydrated w/ his colostomy. Looks better s/p IVFs   P:     IVFs to  10cc/hr BMP daily in icu. Aggressive diuresis as tolerated  GASTROINTESTINAL A:   GI prophylaxis Nutrition   -  mild low mag 10/06/14, resolved 10/19  - mild low phos 10/07/14 +resolved  P:   reglan prn Start tube feeds 10/23 Colostomy care  HEMATOLOGIC A:   VTE Prophylaxis Polycythemia, likely secondary to respiratory disease P:  SCD's / Lovenox. CBC daily in icu  INFECTIOUS A:   Leukocytosis CAP - left consolidation: rhinovirus w/ prob bacterial superinfection  Enterococcus UTI  P:   BCx2 10/7 >> neg  UCx 10/7 >> enterococcus (amp sens) RVP 10/7 >> + rhinovirus   Changed to oral levaquin 10/9>>>10/15 Consider dc vanco 10/19 Vanc 10/15>>>10/20 dc no indication ceftaz 10/15>>> Consider dc all abx 10/23  ENDOCRINE   A:   DM   P:   SSI  Hold outpatient metformin.  NEUROLOGIC A:   Acute metabolic encephalopathy-->resolved.  Deconditioning.      - confusion resolved 10/06/14 after going on bipap 10/05/14, alert 10/20 - weak and sleepy 10/22 may be hypercarbic but required intubation prior to ABG P:   Sedate as needed Physical therapy to  assess, please see notes Monitor closely  FAMILY Updates:  None at bedside 10/21   Palliative care meet:  Full code on 10/20. Unfortunately the major driving force is resp failure .    Richardson Landry Minor  ACNP Maryanna Shape PCCM Pager [phone] till 3 pm If no answer page [phone] 10/11/2014, 9:13 AM    STAFF NOTE: I, Dr Ann Lions have personally reviewed patient's available data, including medical history, events of note, physical examination and test results as part of my evaluation. I have discussed with resident/NP and other care providers such as pharmacist, RN and RRT.  In addition,  I personally evaluated patient and elicited key findings of - re intubated. Will continue diuresis. Consider CVL to monitor CVP's He will most likely need trach but stated he didn't want one prior to intubation. Suspect when extubated he will still have all issues that created need for intubation. Palliative has spoken to him several times and he still wanted full code status.Marland Kitchen  Rest per NP/medical resident whose note is outlined above and that I agree with  The patient is critically ill with multiple organ systems failure and requires high complexity decision making for assessment and support, frequent evaluation and titration of therapies, application of advanced monitoring technologies and extensive interpretation of multiple databases.   Critical Care Time devoted to patient care services described in this note is  31  Minutes. This time reflects time of care of this signee Dr Brand Males. This critical care time does not reflect procedure time, or teaching time or supervisory time of PA/NP/Med student/Med Resident etc but could involve care discussion time    Dr. Brand Males, M.D., [facility].C.P Pulmonary and Critical Care Medicine Staff Physician [place] Pager: [phone], If no answer or between  15:00h - 7:00h: call [phone]  10/11/2014 11:01  AM

## 2014-10-12 ENCOUNTER — Inpatient Hospital Stay (HOSPITAL_COMMUNITY): Payer: Medicare Other

## 2014-10-12 DIAGNOSIS — J9620 Acute and chronic respiratory failure, unspecified whether with hypoxia or hypercapnia: Secondary | ICD-10-CM

## 2014-10-12 LAB — BLOOD GAS, ARTERIAL
ACID-BASE EXCESS: 7.1 mmol/L — AB (ref 0.0–2.0)
BICARBONATE: 30.9 meq/L — AB (ref 20.0–24.0)
Drawn by: 31101
FIO2: 0.4 %
LHR: 12 {breaths}/min
MECHVT: 510 mL
O2 SAT: 97.8 %
PEEP: 5 cmH2O
PO2 ART: 98.2 mmHg (ref 80.0–100.0)
Patient temperature: 98.6
TCO2: 26.8 mmol/L (ref 0–100)
pCO2 arterial: 41.3 mmHg (ref 35.0–45.0)
pH, Arterial: 7.486 — ABNORMAL HIGH (ref 7.350–7.450)

## 2014-10-12 LAB — PHOSPHORUS: Phosphorus: 3.2 mg/dL (ref 2.3–4.6)

## 2014-10-12 LAB — MAGNESIUM: MAGNESIUM: 2.1 mg/dL (ref 1.5–2.5)

## 2014-10-12 MED ORDER — GUAIFENESIN 100 MG/5ML PO SOLN
20.0000 mL | ORAL | Status: DC
  Administered 2014-10-12 – 2014-10-14 (×11): 400 mg
  Filled 2014-10-12 (×2): qty 10
  Filled 2014-10-12: qty 20
  Filled 2014-10-12: qty 10
  Filled 2014-10-12 (×2): qty 20
  Filled 2014-10-12 (×2): qty 10
  Filled 2014-10-12 (×2): qty 20
  Filled 2014-10-12: qty 10
  Filled 2014-10-12: qty 20
  Filled 2014-10-12 (×2): qty 10

## 2014-10-12 MED ORDER — JEVITY 1.2 CAL PO LIQD
1000.0000 mL | ORAL | Status: DC
  Administered 2014-10-12 – 2014-10-14 (×3): 1000 mL
  Filled 2014-10-12 (×3): qty 1000

## 2014-10-12 MED ORDER — DOCUSATE SODIUM 50 MG/5ML PO LIQD
100.0000 mg | Freq: Every day | ORAL | Status: DC
  Administered 2014-10-12 – 2014-10-14 (×3): 100 mg
  Filled 2014-10-12 (×3): qty 10

## 2014-10-12 MED ORDER — VITAMINS A & D EX OINT
TOPICAL_OINTMENT | CUTANEOUS | Status: AC
  Administered 2014-10-12: 11:00:00
  Filled 2014-10-12: qty 5

## 2014-10-12 MED ORDER — PANTOPRAZOLE SODIUM 40 MG PO PACK
40.0000 mg | PACK | Freq: Every day | ORAL | Status: DC
Start: 2014-10-12 — End: 2014-10-14
  Administered 2014-10-12 – 2014-10-14 (×3): 40 mg
  Filled 2014-10-12 (×4): qty 20

## 2014-10-12 NOTE — Progress Notes (Signed)
Dear Doctor: Philip [name] This patient has been identified as a candidate for PICC for the following reason (s): IV therapy over 48 hours and poor veins/poor circulatory system (CHF, COPD, emphysema, diabetes, steroid use, IV drug abuse, etc.) If you agree, please write an order for the indicated device. For any questions contact the Vascular Access Team at [phone] if no answer, please leave a message.  Thank you for supporting the early vascular access assessment program.

## 2014-10-12 NOTE — Progress Notes (Signed)
PULMONARY / CRITICAL CARE MEDICINE   Name: YUSEF LAMP MRN: [id number] DOB: June 11, 1946  PCP Ezequiel Kayser, MD    ADMISSION DATE:  09/24/2014 CONSULTATION DATE:  10/12/2014  REFERRING MD :  EDP  CHIEF COMPLAINT:  SOB  INITIAL PRESENTATION: [name] brought to [facility] ED on 10/6 with SOB, cough, dizziness.  In ED, CXR suggestive of PNA.  He failed BiPAP and required intubation, PCCM consulted for admission.  STUDIES:  CXR 10/6 >> vascular congestion, b/l pleural effusions, RLL atx vs infiltrate. TTE 10/7 >> EF 60-65%, mod concentric hypertrophy, doppler parameters c/w gd I diastolic dysfxn   SIGNIFICANT EVENTS: 10/6  presented to ED, started on BiPAP but failed, required intubation. 10/7  ETT repositioned with poor volume return on vent, to 25 cm at teeth 10/8. Passed SBT, fully awake. Extubated.  10/9 hypertensive. Very congested cough, but no distress.  10/11: desaturations overnight remains on bipap 10/12: patient placed on bipap for increased WOB/RR. Patient reports distress. Using biPAP throughout day.  10/14: up in chair. Looking better. PT and OT suggesting SNF.  10/15 FIO2 needs increased. Feels worse.  10/16: persistent low volumes  10/04/14 No sig clinical change. Feels about the same  10/05/14: Per  RN: refusing meds and thismight be a change. Was in chair yesterday but today in bed and? A bit more sleepy Denies any complaints. But was seen retching for several mintues  10/19 On NIMVS overnight. Try off nimvs and check abg 10/19 met with palliative care and is a partial code, no intubation but ok for cpr, shock, chemicals.  10/20 used nimvs during the night. 10/20 full code per his request via palliative care follow up. 10/22 increased fio2 needs. Looks weaker. Will attempt aggressive diuresis . 10/22 required intubation for progressive resp failure.  SUBJECTIVE/OVERNIGHT/INTERVAL HX Pall care talks active, remains vented, failed weaning 10/23 tachy  VITAL  SIGNS: Temp:  [98.2 F (36.8 C)-100.4 F (38 C)] 98.4 F (36.9 C) (10/24 0630) Pulse Rate:  [39-139] 80 (10/24 0630) Resp:  [12-28] 12 (10/24 0630) BP: (81-147)/(46-87) 91/58 mmHg (10/24 0630) SpO2:  [93 %-99 %] 96 % (10/24 0630) FiO2 (%):  [40 %] 40 % (10/24 0400) Weight:  [85.2 kg (187 lb 13.3 oz)] 85.2 kg (187 lb 13.3 oz) (10/24 0535) Vent Mode:  [-] PRVC FiO2 (%):  [40 %] 40 % Set Rate:  [12 bmp] 12 bmp Vt Set:  [510 mL] 510 mL PEEP:  [5 cmH20] 5 cmH20 Plateau Pressure:  [19 cmH20-23 cmH20] 20 cmH20  INTAKE / OUTPUT: Intake/Output     10/23 0701 - 10/24 0700 10/24 0701 - 10/25 0700   I.V. (mL/kg) 304.8 (3.6)    NG/GT 305.2    IV Piggyback 150    Total Intake(mL/kg) 759.9 (8.9)    Urine (mL/kg/hr) 695 (0.3)    Emesis/NG output 150 (0.1)    Stool 85 (0)    Total Output 930     Net -170.1            PHYSICAL EXAMINATION: General: adult male . Sedated on vent Neuro: rass -2 HEENT: some JVD. ETT Cardiovascular: s1 s2 RRR Lung : air movement on vent coarse Abdomen: BS x 4, soft, NT/ND, colostomy wnl No co abd pain. OGT , no tube feeds Musculoskeletal: No gross deformities, mild edema Skin: Intact, warm, dry  LABS: PULMONARY  Recent Labs Lab 10/06/14 1226 10/07/14 1038 10/10/14 1614 10/10/14 2028 10/12/14 0242  PHART 7.395 7.383 7.573* 7.481* 7.486*  PCO2ART 57.3*  54.5* 37.4 46.5* 41.3  PO2ART 58.0* 67.6* 122.0* 110.0* 98.2  HCO3 34.3* 31.8* 34.6* 34.3* 30.9*  TCO2 29.7 27.9 29.2 29.6 26.8  O2SAT 90.2 92.8 99.0 98.5 97.8    CBC  Recent Labs Lab 10/09/14 0322 10/10/14 0328 10/11/14 0335  HGB 13.9 13.8 14.3  HCT 43.2 44.0 44.9  WBC 15.1* 13.0* 14.0*  PLT 125* 111* 116*    COAGULATION No results found for this basename: INR,  in the last 168 hours  CARDIAC  No results found for this basename: TROPONINI,  in the last 168 hours  Recent Labs Lab 10/07/14 0340  PROBNP 314.5*     CHEMISTRY  Recent Labs Lab 10/07/14 0340 10/08/14 0355  10/09/14 0322 10/10/14 0328 10/11/14 0335 10/12/14 0403  NA 136* 139 139 139 136*  --   K 5.2 4.3 4.7 4.0 4.3  --   CL 95* 97 98 96 93*  --   CO2 31 32 31 35* 32  --   GLUCOSE 67* 88 103* 89 87  --   BUN $Re'22 19 22 16 14  'dxQ$ --   CREATININE 0.95 0.96 1.16 1.04 1.02  --   CALCIUM 8.8 9.1 8.8 8.9 9.3  --   MG 2.0 1.7 1.6 1.9 1.6 2.1  PHOS 2.2* 1.6* 3.3 2.9 3.0 3.2   Estimated Creatinine Clearance: 70.6 ml/min (by C-G formula based on Cr of 1.02).   LIVER No results found for this basename: AST, ALT, ALKPHOS, BILITOT, PROT, ALBUMIN, INR,  in the last 168 hours   INFECTIOUS No results found for this basename: LATICACIDVEN, PROCALCITON,  in the last 168 hours   ENDOCRINE CBG (last 3)  No results found for this basename: GLUCAP,  in the last 72 hours       IMAGING x48h Dg Chest Port 1 View  10/11/2014   CLINICAL DATA:  Respiratory failure.  EXAM: PORTABLE CHEST - 1 VIEW  COMPARISON:  October 10, 2014.  FINDINGS: Endotracheal tube is in grossly good position with distal tip approximately 5.6 cm above the carina. Nasogastric tube is seen passing through the esophagus into the stomach. Stable bilateral basilar opacities are noted concerning for pneumonia or atelectasis with associated pleural effusions. No pneumothorax is noted. Bony thorax appears intact.  IMPRESSION: Endotracheal and nasogastric tubes in grossly good position. Stable bilateral basilar opacities are noted concerning for pneumonia or atelectasis with associated pleural effusions.   Electronically Signed   By: Sabino Dick M.D.   On: 10/11/2014 07:29   Dg Chest Port 1 View  10/10/2014   CLINICAL DATA:  Status post intubation.  EXAM: PORTABLE CHEST - 1 VIEW  COMPARISON:  Single view of the chest 10/10/2014 4:49 a.m.  FINDINGS: New endotracheal tube is in place with the tip in good position approximately 3.8 cm above the carina. NGT courses into the stomach and below the inferior margin of the film. Bibasilar airspace  disease and small right effusion are again seen and appear slightly worsened. No pneumothorax.  IMPRESSION: Endotracheal tube and NG tube project in good position.  Small effusions and basilar airspace disease appear somewhat increased.   Electronically Signed   By: Inge Rise M.D.   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  Mucous plugging, possibly element of effusion, but ATX the larger component .  - 10/21:requires bipap nocturnally. He has documented OSA.  Poor pulmonary reserves -10/22 much weaker and required intubation  P:   BAG reviewed, may need Tv reduction Goal cpap 5 ps 5, goa 2 hrs, if fails to increase PS further Active d/w family for trach Monday vs extubation and hope fo rbest in future DuoNebs / PRN Albuterol. Flutter. Cont prednisone 20 mg change to solumedrol if wheezing noted Continue brovana and budesonide  Was neg 170, would favor further Needs pcxr in am , small lung volumes, at risk collapse  CARDIOVASCULAR A:  Hx HTN, HLD - BNP, lactate, cortisol, troponin wnl HTN   - tachycardic after dc coreg on 10./19/15, better 10/22 with bb restarted P:  pravachol home meds.  Continue home norvasc, cardura, lotensin PRN hydralazine for BP >160, unless tachy noted Lasix required  RENAL Lab Results  Component Value Date   CREATININE 1.02 10/11/2014   CREATININE 1.04 10/10/2014   CREATININE 1.16 10/09/2014    Recent Labs Lab 10/09/14 0322 10/10/14 0328 10/11/14 0335  NA 139 139 136*     A:   Acute kidney injury better -- hypernatremia resolved  P:    kvo Await am bmet, then decide on diuresis needs  GASTROINTESTINAL A:   GI prophylaxis Nutrition  P:   reglan prn Start  tube feeds 10/23 to goal as able Colostomy care  HEMATOLOGIC A:   VTE Prophylaxis Polycythemia, likely secondary to respiratory disease P:  SCD's / Lovenox, follow crt closely Limit phlebotomy as able  INFECTIOUS A:   Leukocytosis CAP - left consolidation: rhinovirus w/ prob bacterial superinfection  Enterococcus UTI  P:   BCx2 10/7 >> neg  UCx 10/7 >> enterococcus (amp sens) RVP 10/7 >> + rhinovirus   Changed to oral levaquin 10/9>>>10/15 Consider dc vanco 10/19 Vanc 10/15>>>10/20 dc no indication ceftaz 10/15>>>10/24 Consider dc all abx today, secretions clear, no fevers, no defined infiltratesm, high risk cdiff  ENDOCRINE  A:   DM   P:   SSI   NEUROLOGIC A:   Acute metabolic encephalopathy-->resolved.  Deconditioning. Vent dyschony P:   Need WUA Limit benzo as able  FAMILY Updates:  None at bedside 10/21   Palliative care meet:  Partial code per pall care team.   Ccm time 30 min  .Lavon Paganini. Titus Mould, MD, [place] Pgr: [facility]

## 2014-10-13 ENCOUNTER — Inpatient Hospital Stay (HOSPITAL_COMMUNITY): Payer: Medicare Other

## 2014-10-13 LAB — BASIC METABOLIC PANEL
ANION GAP: 10 (ref 5–15)
BUN: 20 mg/dL (ref 6–23)
CALCIUM: 9.1 mg/dL (ref 8.4–10.5)
CO2: 27 meq/L (ref 19–32)
Chloride: 98 mEq/L (ref 96–112)
Creatinine, Ser: 1.01 mg/dL (ref 0.50–1.35)
GFR calc Af Amer: 86 mL/min — ABNORMAL LOW (ref >90)
GFR, EST NON AFRICAN AMERICAN: 74 mL/min — AB (ref >90)
Glucose, Bld: 142 mg/dL — ABNORMAL HIGH (ref 70–99)
Potassium: 4.7 mEq/L (ref 3.7–5.3)
Sodium: 135 mEq/L — ABNORMAL LOW (ref 137–147)

## 2014-10-13 LAB — CBC WITH DIFFERENTIAL/PLATELET
BASOS ABS: 0 10*3/uL (ref 0.0–0.1)
Basophils Relative: 0 % (ref 0–1)
EOS ABS: 0.1 10*3/uL (ref 0.0–0.7)
EOS PCT: 1 % (ref 0–5)
HCT: 40.8 % (ref 39.0–52.0)
Hemoglobin: 13.1 g/dL (ref 13.0–17.0)
LYMPHS PCT: 10 % — AB (ref 12–46)
Lymphs Abs: 1.1 10*3/uL (ref 0.7–4.0)
MCH: 29.4 pg (ref 26.0–34.0)
MCHC: 32.1 g/dL (ref 30.0–36.0)
MCV: 91.7 fL (ref 78.0–100.0)
Monocytes Absolute: 0.7 10*3/uL (ref 0.1–1.0)
Monocytes Relative: 6 % (ref 3–12)
Neutro Abs: 9.3 10*3/uL — ABNORMAL HIGH (ref 1.7–7.7)
Neutrophils Relative %: 83 % — ABNORMAL HIGH (ref 43–77)
Platelets: 88 10*3/uL — ABNORMAL LOW (ref 150–400)
RBC: 4.45 MIL/uL (ref 4.22–5.81)
RDW: 13.8 % (ref 11.5–15.5)
WBC: 11.2 10*3/uL — ABNORMAL HIGH (ref 4.0–10.5)

## 2014-10-13 MED ORDER — PREDNISONE 20 MG PO TABS
10.0000 mg | ORAL_TABLET | Freq: Every day | ORAL | Status: DC
  Administered 2014-10-13 – 2014-10-16 (×4): 10 mg via ORAL
  Filled 2014-10-13 (×4): qty 1

## 2014-10-13 MED ORDER — MIDAZOLAM HCL 2 MG/2ML IJ SOLN
1.0000 mg | INTRAMUSCULAR | Status: DC | PRN
  Administered 2014-10-13 – 2014-10-14 (×3): 1 mg via INTRAVENOUS
  Filled 2014-10-13 (×3): qty 2

## 2014-10-13 MED ORDER — FUROSEMIDE 10 MG/ML IJ SOLN
40.0000 mg | Freq: Two times a day (BID) | INTRAMUSCULAR | Status: AC
  Administered 2014-10-13 – 2014-10-14 (×4): 40 mg via INTRAVENOUS
  Filled 2014-10-13 (×5): qty 4

## 2014-10-13 NOTE — Progress Notes (Signed)
PULMONARY / CRITICAL CARE MEDICINE   Name: Philip [name] MRN: [id number] DOB: [birth year]  PCP Jerlyn Ly, MD    ADMISSION DATE:  09/24/2014 CONSULTATION DATE:  10/13/2014  REFERRING MD :  EDP  CHIEF COMPLAINT:  SOB  INITIAL PRESENTATION: [name] brought to [facility] ED on 10/6 with SOB, cough, dizziness.  In ED, CXR suggestive of PNA.  He failed BiPAP and required intubation, PCCM consulted for admission.  STUDIES:  CXR 10/6 >> vascular congestion, b/l pleural effusions, RLL atx vs infiltrate. TTE 10/7 >> EF 60-65%, mod concentric hypertrophy, doppler parameters c/w gd I diastolic dysfxn   SIGNIFICANT EVENTS: 10/6  presented to ED, started on BiPAP but failed, required intubation. 10/7  ETT repositioned with poor volume return on vent, to 25 cm at teeth 10/8. Passed SBT, fully awake. Extubated.  10/9 hypertensive. Very congested cough, but no distress.  10/11: desaturations overnight remains on bipap 10/12: patient placed on bipap for increased WOB/RR. Patient reports distress. Using biPAP throughout day.  10/14: up in chair. Looking better. PT and OT suggesting SNF.  10/15 FIO2 needs increased. Feels worse.  10/16: persistent low volumes  10/04/14 No sig clinical change. Feels about the same  10/05/14: Per  RN: refusing meds and thismight be a change. Was in chair yesterday but today in bed and? A bit more sleepy Denies any complaints. But was seen retching for several mintues  10/19 On NIMVS overnight. Try off nimvs and check abg 10/19 met with palliative care and is a partial code, no intubation but ok for cpr, shock, chemicals.  10/20 used nimvs during the night. 10/20 full code per his request via palliative care follow up. 10/22 increased fio2 needs. Looks weaker. Will attempt aggressive diuresis . 10/22 required intubation for progressive resp failure.  SUBJECTIVE/OVERNIGHT/INTERVAL HX Weaned ok 10/24, agitation this am early  VITAL SIGNS: Temp:  [98.2 F (36.8 C)-99.5  F (37.5 C)] 98.2 F (36.8 C) (10/25 0600) Pulse Rate:  [42-100] 74 (10/25 0600) Resp:  [11-27] 12 (10/25 0600) BP: (86-139)/(55-80) 108/61 mmHg (10/25 0600) SpO2:  [94 %-100 %] 100 % (10/25 0600) FiO2 (%):  [30 %-40 %] 30 % (10/25 0400) Weight:  [85.5 kg (188 lb 7.9 oz)] 85.5 kg (188 lb 7.9 oz) (10/25 0252) Vent Mode:  [-] PRVC FiO2 (%):  [30 %-40 %] 30 % Set Rate:  [12 bmp] 12 bmp Vt Set:  [510 mL] 510 mL PEEP:  [5 cmH20] 5 cmH20 Pressure Support:  [12 cmH20] 12 cmH20 Plateau Pressure:  [17 cmH20-21 cmH20] 18 cmH20  INTAKE / OUTPUT: Intake/Output     10/24 0701 - 10/25 0700 10/25 0701 - 10/26 0700   I.V. (mL/kg) 502.4 (5.9)    NG/GT 1290    IV Piggyback     Total Intake(mL/kg) 1792.4 (21)    Urine (mL/kg/hr) 745 (0.4)    Emesis/NG output     Stool 225 (0.1)    Total Output 970     Net +822.4          Stool Occurrence 1 x      PHYSICAL EXAMINATION: General: adult male . Sedated on vent rass -2 Neuro: rass -2, nonfocal HEENT:  wnl JVD. ETT Cardiovascular: s1 s2 RRR, not tachy now Lung : CTA Abdomen: BS x 4, soft, NT/ND, colostomy wnl Musculoskeletal: minimal edema Skin: Intact, warm, dry  LABS: PULMONARY  Recent Labs Lab 10/06/14 1226 10/07/14 1038 10/10/14 1614 10/10/14 2028 10/12/14 0242  PHART 7.395 7.383 7.573* 7.481* 7.486*  PCO2ART 57.3* 54.5* 37.4 46.5* 41.3  PO2ART 58.0* 67.6* 122.0* 110.0* 98.2  HCO3 34.3* 31.8* 34.6* 34.3* 30.9*  TCO2 29.7 27.9 29.2 29.6 26.8  O2SAT 90.2 92.8 99.0 98.5 97.8    CBC  Recent Labs Lab 10/10/14 0328 10/11/14 0335 10/13/14 0345  HGB 13.8 14.3 13.1  HCT 44.0 44.9 40.8  WBC 13.0* 14.0* 11.2*  PLT 111* 116* 88*    COAGULATION No results found for this basename: INR,  in the last 168 hours  CARDIAC  No results found for this basename: TROPONINI,  in the last 168 hours  Recent Labs Lab 10/07/14 0340  PROBNP 314.5*     CHEMISTRY  Recent Labs Lab 10/08/14 0355 10/09/14 0322 10/10/14 0328  10/11/14 0335 10/12/14 0403 10/13/14 0345  NA 139 139 139 136*  --  135*  K 4.3 4.7 4.0 4.3  --  4.7  CL 97 98 96 93*  --  98  CO2 32 31 35* 32  --  27  GLUCOSE 88 103* 89 87  --  142*  BUN _0 --  20  CREATININE 0.96 1.16 1.04 1.02  --  1.01  CALCIUM 9.1 8.8 8.9 9.3  --  9.1  MG 1.7 1.6 1.9 1.6 2.1  --   PHOS 1.6* 3.3 2.9 3.0 3.2  --    Estimated Creatinine Clearance: 71.3 ml/min (by C-G formula based on Cr of 1.01).   LIVER No results found for this basename: AST, ALT, ALKPHOS, BILITOT, PROT, ALBUMIN, INR,  in the last 168 hours   INFECTIOUS No results found for this basename: LATICACIDVEN, PROCALCITON,  in the last 168 hours   ENDOCRINE CBG (last 3)  No results found for this basename: GLUCAP,  in the last 72 hours       IMAGING x48h Dg Chest Port 1 View  10/12/2014   CLINICAL DATA:  Respiratory failure subsequent evaluation, history of diabetes hypertension former smoking history, prostate cancer  EXAM: PORTABLE CHEST - 1 VIEW  COMPARISON:  10/11/2014  FINDINGS: Endotracheal tube unchanged.  NG tube unchanged.  Low lung volumes with bilateral lower lobe atelectasis. Consolidation left base worse than right with air bronchograms. Cardiac silhouette largely obscured by the opacities. Vascular pattern normal with no evidence of pulmonary edema.  IMPRESSION: Mildly improved aeration with mild residual atelectasis at the right base.  Mildly improved aeration with persistent significant left base consolidation. This likely represents atelectasis but superimposed concurrent pneumonia not excluded.   Electronically Signed   By: Skipper Cliche M.D.   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 Flutter. Cont prednisone to 10 mg, reducing today Continue brovana and budesonide   CARDIOVASCULAR A:  Hx HTN, HLD - BNP, lactate, cortisol, troponin wnl HTN   - tachycardic after dc coreg on 10./19/15, better 10/22 with bb restarted P:  pravachol home meds.  Continue home norvasc, cardura, lotensin Lasix to remain at current dose, however was  pos 800 cc last 24 hrs, may need escallation if volume becomes an issue for weaning  RENAL Lab Results  Component Value Date   CREATININE 1.01 10/13/2014   CREATININE 1.02 10/11/2014   CREATININE 1.04 10/10/2014    Recent Labs Lab 10/10/14 0328 10/11/14 0335 10/13/14 0345  NA 139 136* 135*     A:   Acute kidney injury resolved hypernatremia resolved  P:    kvo Lasix to even balance  GASTROINTESTINAL A:   GI prophylaxis Nutrition  P:   reglan prn TF tolerated Colostomy care  HEMATOLOGIC A:   VTE Prophylaxis Polycythemia, likely secondary to respiratory disease Mild new thrombocytopenia P:  SCD's / Lovenox, follow plat , may need to hold Will need cbc in am for plat trend  INFECTIOUS A:   Leukocytosis CAP - left consolidation: rhinovirus w/ prob bacterial superinfection  Enterococcus UTI  P:   BCx2 10/7 >> neg  UCx 10/7 >> enterococcus (amp sens) RVP 10/7 >> + rhinovirus   Changed to oral levaquin 10/9>>>10/15 Consider dc vanco 10/19 Vanc 10/15>>>10/20 dc no indication ceftaz 10/15>>>10/24  Monitor off abx, completed course  ENDOCRINE  A:   DM   P:   SSI  NICE 140-180 goals   NEUROLOGIC A:   Acute metabolic encephalopathy-->resolved.  Deconditioning. Vent dyschony P:   WUA Limit benzo as able, goal is to dc versed drip  FAMILY Updates:  None at bedside  10/21   Palliative care meet:  Partial code status needs to be determined, weaning better, lasix restart, follow plat in am cbc   Ccm time 30 min  .Lavon Paganini. Titus Mould, MD, [place] Pgr: [facility]

## 2014-10-13 NOTE — Progress Notes (Signed)
Wasted 10 ml of versed drip.  Witness Lyda Kalataonnie Stern, RN.

## 2014-10-14 ENCOUNTER — Inpatient Hospital Stay (HOSPITAL_COMMUNITY): Payer: Medicare Other

## 2014-10-14 DIAGNOSIS — G4733 Obstructive sleep apnea (adult) (pediatric): Secondary | ICD-10-CM

## 2014-10-14 DIAGNOSIS — R0600 Dyspnea, unspecified: Secondary | ICD-10-CM

## 2014-10-14 DIAGNOSIS — J449 Chronic obstructive pulmonary disease, unspecified: Secondary | ICD-10-CM

## 2014-10-14 LAB — CBC
HCT: 40.2 % (ref 39.0–52.0)
Hemoglobin: 12.9 g/dL — ABNORMAL LOW (ref 13.0–17.0)
MCH: 29.4 pg (ref 26.0–34.0)
MCHC: 32.1 g/dL (ref 30.0–36.0)
MCV: 91.6 fL (ref 78.0–100.0)
PLATELETS: 99 10*3/uL — AB (ref 150–400)
RBC: 4.39 MIL/uL (ref 4.22–5.81)
RDW: 13.6 % (ref 11.5–15.5)
WBC: 15.2 10*3/uL — AB (ref 4.0–10.5)

## 2014-10-14 LAB — BASIC METABOLIC PANEL
ANION GAP: 11 (ref 5–15)
BUN: 20 mg/dL (ref 6–23)
CALCIUM: 9.3 mg/dL (ref 8.4–10.5)
CO2: 32 mEq/L (ref 19–32)
Chloride: 94 mEq/L — ABNORMAL LOW (ref 96–112)
Creatinine, Ser: 1.18 mg/dL (ref 0.50–1.35)
GFR calc Af Amer: 71 mL/min — ABNORMAL LOW (ref >90)
GFR, EST NON AFRICAN AMERICAN: 61 mL/min — AB (ref >90)
Glucose, Bld: 152 mg/dL — ABNORMAL HIGH (ref 70–99)
Potassium: 4.5 mEq/L (ref 3.7–5.3)
SODIUM: 137 meq/L (ref 137–147)

## 2014-10-14 MED ORDER — PRO-STAT SUGAR FREE PO LIQD
30.0000 mL | Freq: Every morning | ORAL | Status: DC
  Filled 2014-10-14: qty 30

## 2014-10-14 MED ORDER — LORAZEPAM 2 MG/ML IJ SOLN
INTRAMUSCULAR | Status: AC
  Filled 2014-10-14: qty 1

## 2014-10-14 MED ORDER — PANTOPRAZOLE SODIUM 40 MG IV SOLR
40.0000 mg | Freq: Every day | INTRAVENOUS | Status: DC
  Administered 2014-10-15: 40 mg via INTRAVENOUS
  Filled 2014-10-14: qty 40

## 2014-10-14 MED ORDER — GUAIFENESIN 100 MG/5ML PO SOLN
20.0000 mL | ORAL | Status: DC
  Administered 2014-10-14 – 2014-10-15 (×5): 400 mg via ORAL
  Administered 2014-10-15: 200 mg via ORAL
  Administered 2014-10-16 (×2): 400 mg via ORAL
  Filled 2014-10-14: qty 20
  Filled 2014-10-14: qty 10
  Filled 2014-10-14: qty 20
  Filled 2014-10-14 (×4): qty 10
  Filled 2014-10-14 (×2): qty 20

## 2014-10-14 MED ORDER — BISACODYL 10 MG RE SUPP: 10.0000 mg | Freq: Every day | RECTAL | Status: DC | PRN

## 2014-10-14 MED ORDER — FENTANYL BOLUS VIA INFUSION
25.0000 ug | INTRAVENOUS | Status: DC | PRN
  Filled 2014-10-14: qty 50

## 2014-10-14 MED ORDER — SORBITOL 70 % SOLN
30.0000 mL | Freq: Every day | Status: DC | PRN
  Filled 2014-10-14: qty 30

## 2014-10-14 MED ORDER — LORAZEPAM 2 MG/ML IJ SOLN
1.0000 mg | INTRAMUSCULAR | Status: DC | PRN
  Administered 2014-10-15 – 2014-10-16 (×4): 1 mg via INTRAVENOUS
  Filled 2014-10-14 (×5): qty 1

## 2014-10-14 MED ORDER — POTASSIUM CHLORIDE 20 MEQ/15ML (10%) PO LIQD
30.0000 meq | Freq: Two times a day (BID) | ORAL | Status: DC
  Administered 2014-10-14 – 2014-10-15 (×3): 30 meq via ORAL
  Filled 2014-10-14 (×3): qty 30

## 2014-10-14 MED ORDER — LORAZEPAM 2 MG/ML IJ SOLN: 2.0000 mg | INTRAMUSCULAR | Status: DC | PRN

## 2014-10-14 NOTE — Procedures (Signed)
Extubation Procedure Note  Patient Details:   Name: Frederica Kusterrthur L Juncaj DOB: 05/06/1946 MRN: [id number]   Airway Documentation:  Airway 8 mm (Active)  Secured at (cm) 24 cm 10/14/2014  8:27 AM  Measured From Lips 10/14/2014  8:27 AM  Secured Location Right 10/14/2014  8:27 AM  Secured By [employer] Tube Holder 10/14/2014  8:27 AM  Tube Holder Repositioned Yes 10/14/2014  8:27 AM  Cuff Pressure (cm H2O) 24 cm H2O 10/13/2014  7:44 PM  Site Condition Dry 10/11/2014  3:25 AM   Pt extubated to 4lpm [place],achieved 750cc's on incentive spirometer. Tolerating well. Evaluation  O2 sats: stable throughout Complications: No apparent complications Patient did tolerate procedure well. Bilateral Breath Sounds: Rhonchi Suctioning: Airway Yes  Renae FickleScott, Ledger Heindl Michelle 10/14/2014, 11:51 AM

## 2014-10-14 NOTE — Progress Notes (Signed)
PULMONARY / CRITICAL CARE MEDICINE   Name: Philip [name] MRN: [id number] DOB: May 27, 1946  PCP Jerlyn Ly, MD    ADMISSION DATE:  09/24/2014 CONSULTATION DATE:  10/14/2014  REFERRING MD :  EDP  CHIEF COMPLAINT:  SOB  INITIAL PRESENTATION: [name] brought to [facility] ED on 10/6 with SOB, cough, dizziness.  In ED, CXR suggestive of PNA.  He failed BiPAP and required intubation, PCCM consulted for admission.  STUDIES:  CXR 10/6 >> vascular congestion, b/l pleural effusions, RLL atx vs infiltrate. TTE 10/7 >> EF 60-65%, mod concentric hypertrophy, doppler parameters c/w gd I diastolic dysfxn   SIGNIFICANT EVENTS: 10/6  presented to ED, started on BiPAP but failed, required intubation. 10/7  ETT repositioned with poor volume return on vent, to 25 cm at teeth 10/8. Passed SBT, fully awake. Extubated.  10/9 hypertensive. Very congested cough, but no distress.  10/11: desaturations overnight remains on bipap 10/12: patient placed on bipap for increased WOB/RR. Patient reports distress. Using biPAP throughout day.  10/14: up in chair. Looking better. PT and OT suggesting SNF.  10/15 FIO2 needs increased. Feels worse.  10/16: persistent low volumes  10/04/14 No sig clinical change. Feels about the same  10/05/14: Per  RN: refusing meds and thismight be a change. Was in chair yesterday but today in bed and? A bit more sleepy Denies any complaints. But was seen retching for several mintues  10/19 On NIMVS overnight. Try off nimvs and check abg 10/19 met with palliative care and is a partial code, no intubation but ok for cpr, shock, chemicals.  10/20 used nimvs during the night. 10/20 full code per his request via palliative care follow up. 10/22 increased fio2 needs. Looks weaker. Will attempt aggressive diuresis . 10/22 required intubation for progressive resp failure. 10/26 Family meeting with palliative care and terminal wean started.    SUBJECTIVE/OVERNIGHT/INTERVAL HX ON full vent  support. NAD at rest.  VITAL SIGNS: Temp:  [98.6 F (37 C)-100.6 F (38.1 C)] 99.1 F (37.3 C) (10/26 0800) Pulse Rate:  [53-139] 127 (10/26 0827) Resp:  [12-33] 21 (10/26 0827) BP: (95-147)/(55-78) 133/55 mmHg (10/26 0827) SpO2:  [52 %-100 %] 95 % (10/26 0827) FiO2 (%):  [30 %] 30 % (10/26 0827) Weight:  [187 lb 13.3 oz (85.2 kg)] 187 lb 13.3 oz (85.2 kg) (10/26 0339) Vent Mode:  [-] PRVC FiO2 (%):  [30 %] 30 % Set Rate:  [12 bmp] 12 bmp Vt Set:  [510 mL] 510 mL PEEP:  [5 cmH20] 5 cmH20 Pressure Support:  [10 cmH20] 10 cmH20 Plateau Pressure:  [15 cmH20-20 cmH20] 18 cmH20  INTAKE / OUTPUT: Intake/Output     10/25 0701 - 10/26 0700 10/26 0701 - 10/27 0700   I.V. (mL/kg) 416.8 (4.9) 41.3 (0.5)   NG/GT 1460 190   Total Intake(mL/kg) 1876.8 (22) 231.3 (2.7)   Urine (mL/kg/hr) 3050 (1.5) 125 (0.6)   Stool 350 (0.2)    Total Output 3400 125   Net -1523.2 +106.3          PHYSICAL EXAMINATION: General: adult male . Sedated on vent rass -1 Neuro: rass -1 rouses to voice but no follow commands HEENT:  wnl JVD. ETT Cardiovascular: s1 s2 RRR, not tachy  Lung : CTA, diminished in bases Abdomen: BS x 4, soft, NT/ND, colostomy wnl Musculoskeletal: minimal edema Skin: Intact, warm, dry  LABS: PULMONARY  Recent Labs Lab 10/07/14 1038 10/10/14 1614 10/10/14 2028 10/12/14 0242  PHART 7.383 7.573* 7.481* 7.486*  PCO2ART  54.5* 37.4 46.5* 41.3  PO2ART 67.6* 122.0* 110.0* 98.2  HCO3 31.8* 34.6* 34.3* 30.9*  TCO2 27.9 29.2 29.6 26.8  O2SAT 92.8 99.0 98.5 97.8    CBC  Recent Labs Lab 10/11/14 0335 10/13/14 0345 10/14/14 0331  HGB 14.3 13.1 12.9*  HCT 44.9 40.8 40.2  WBC 14.0* 11.2* 15.2*  PLT 116* 88* 99*    COAGULATION No results found for this basename: INR,  in the last 168 hours  CARDIAC  No results found for this basename: TROPONINI,  in the last 168 hours No results found for this basename: PROBNP,  in the last 168 hours   CHEMISTRY  Recent Labs Lab  10/08/14 0355 10/09/14 0322 10/10/14 0328 10/11/14 0335 10/12/14 0403 10/13/14 0345 10/14/14 0331  NA 139 139 139 136*  --  135* 137  K 4.3 4.7 4.0 4.3  --  4.7 4.5  CL 97 98 96 93*  --  98 94*  CO2 32 31 35* 32  --  27 32  GLUCOSE 88 103* 89 87  --  142* 152*  BUN $Re'19 22 16 14  'DAR$ --  20 20  CREATININE 0.96 1.16 1.04 1.02  --  1.01 1.18  CALCIUM 9.1 8.8 8.9 9.3  --  9.1 9.3  MG 1.7 1.6 1.9 1.6 2.1  --   --   PHOS 1.6* 3.3 2.9 3.0 3.2  --   --    Estimated Creatinine Clearance: 61 ml/min (by C-G formula based on Cr of 1.18).   LIVER No results found for this basename: AST, ALT, ALKPHOS, BILITOT, PROT, ALBUMIN, INR,  in the last 168 hours   INFECTIOUS No results found for this basename: LATICACIDVEN, PROCALCITON,  in the last 168 hours   ENDOCRINE CBG (last 3)  No results found for this basename: GLUCAP,  in the last 72 hours       IMAGING x48h Dg Chest Port 1 View  10/14/2014   CLINICAL DATA:  Acute respiratory acidosis.  EXAM: PORTABLE CHEST - 1 VIEW  COMPARISON:  10/13/2014 and CT chest 10/04/2014.  FINDINGS: Endotracheal tube terminates at the level of the clavicular heads, well above the carina. Nasogastric tube is followed into the stomach. Heart size is grossly stable. Lungs are low in volume with bibasilar collapse/consolidation, stable. There may be small bilateral pleural effusions. Biapical pleural thickening, right greater than left.  IMPRESSION: 1. Lungs are low in volume with bibasilar collapse/consolidation, stable. 2. Suspect small bilateral pleural effusions.   Electronically Signed   By: Lorin Picket M.D.   On: 10/14/2014 08:35   Dg Chest Port 1 View  10/13/2014   CLINICAL DATA:  Acute respiratory acidosis. History of hypertension and diabetes. Ex-smoker.  EXAM: PORTABLE CHEST - 1 VIEW  COMPARISON:  10/12/2014.  FINDINGS: Left greater than right lung base opacity is without change, likely atelectasis. Lung volumes are relatively low. No pulmonary edema.  No new lung opacities. No pneumothorax.  Endotracheal tube and nasogastric tube are stable and well positioned.  IMPRESSION: 1. No change from the previous day's study. 2. Persistent lung base opacity, left greater than right, likely atelectasis. Left lung base pneumonia should be considered in the proper clinical setting. 3. Well positioned and stable support apparatus.   Electronically Signed   By: Lajean Manes M.D.   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  Continue home norvasc, cardura, lotensin Lasix for negatie i/o RENAL Lab Results  Component Value Date   CREATININE 1.18 10/14/2014   CREATININE 1.01 10/13/2014   CREATININE 1.02 10/11/2014    Recent Labs Lab 10/11/14 0335 10/13/14 0345 10/14/14 0331  NA 136* 135* 137     A:   Acute kidney injury resolved hypernatremia resolved  P:    kvo Lasix to even balance  GASTROINTESTINAL A:   GI prophylaxis Nutrition  P:   reglan prn NPO Colostomy care  HEMATOLOGIC A:   VTE Prophylaxis Polycythemia, likely secondary to respiratory disease Mild new thrombocytopenia P:  SCD's / Lovenox, follow plat , may need to hold Will need cbc in am for plat trend  INFECTIOUS A:   Leukocytosis CAP - left consolidation: rhinovirus w/ prob bacterial superinfection  Enterococcus UTI  P:   BCx2 10/7 >> neg  UCx 10/7 >> enterococcus (amp sens) RVP 10/7 >> + rhinovirus   Changed to oral levaquin  10/9>>>10/15 Consider dc vanco 10/19 Vanc 10/15>>>10/20 dc no indication ceftaz 10/15>>>10/24  Monitor off abx, completed course  ENDOCRINE  A:   DM   P:   SSI  140-180 goals   NEUROLOGIC A:   Acute metabolic encephalopathy-->resolved.  Deconditioning. Vent dyschony P:   Terminal wean  FAMILY Updates:  Per palliative care 10/26. Terminal wean.   Palliative care meet:  Full code currently. Family to further guide goals on extubation. He is effectively DNR with possible terminal wean in near future. 10/26 1130 am. Palliative care has met with family and terminal extubation instituted.   Richardson Landry Minor ACNP Maryanna Shape PCCM Pager [phone] till 3 pm If no answer page [phone] 10/14/2014, 9:38 AM   Baltazar Apo, MD, PhD 10/14/2014, 12:33 PM [facility] [phone] or if no answer [phone]

## 2014-10-14 NOTE — Progress Notes (Signed)
NUTRITION FOLLOW UP  Intervention:   -Continue Jevity 1.2 at goal rate of 60 ml/hr with 30 ml Pro-Stat once daily to provide 1828 kcal (100% est protein needs), 95 gram protein (100% est protein needs) -RD to continue to monitor  Nutrition Dx:   Inadequate oral intake related to inability to eat as evidenced by NPO status - continues d/t vent status  New nutrition dx: Increased nutrient needs related to community acquired pneumonia and COPD as evidenced by MD notes- continues  Goal:  Pt to meet >/= 90% of their estimated nutrition needs   Monitor:   TF tolerance, total protein/energy intake, diet order, weights, labs,   Assessment:   [name] brought to [facility] ED on 10/6 with SOB, cough, dizziness. In ED, CXR suggestive of PNA. He failed BiPAP and required intubation.   10/7: - Per weight history documentation, pt weight has been stable.  - Per family, pt has not been eating well x 3 weeks d/t decreased appetite. Family states that pt may only eat a bowl of cereal all day, if eating at all. Cough and symptoms appeared around 2 weeks ago.  10/8: - Extubated this morning, diet advanced to diabetic diet, pt eating 100% of meals - Met with pt who reports not eating much of anything since Monday PTA due to not feeling well however before then had excellent appetite and was eating 3 meals/day with blood sugars under control   10/15: -Pt reported decreased appetite for past several days. Noted chest congestion and Bipap use inhibits PO intake ability to eat. Denied nausea or abd pain. -Consuming < 50% of meals -Was willing to trial use of Ensure supplements for nutrient replenishment  -AKI r/t dehydration from colostomy. NS increased.   -Phos/Mg elevated on 10/12, K mildly elevated at 5.4. Diet restriction not warranted at this time as pt with decreased intake and would benefit from liberalized options  10/23: -PO intake varied from 0-75-100% per RN documentation. -Low phos and Mg  resolved -Has OGT placement, Jevity 1.2 ordered. -Patient is currently intubated on ventilator support on 10/22 MV: 6.2 L/min Temp (24hrs), Avg:99.7 F (37.6 C), Min:98.6 F (37 C), Max:100.6 F (38.1 C)  Propofol: 0 ml/hr  10/26: -Jevity 1.2 advanced to goal rate of 60 ml/hr -Tolerating with 0 ml residuals -Will recommend addition of Pro-Stat once daily to meet >90% estimated energy needs.  -Glucose slightly elevated, but within 140-180 goals. Consider modification to Vital AF 1.2 as warranted -Weight decreased 18 lb (7.7 kg) since admit in 10/07, likely r/t generalized edema, fluid fluctuations, and periods of decreased appetite. -Phos/K/Mg WNL -GOC meeting planned for today (10/26)   Height: Ht Readings from Last 1 Encounters:  10/12/14 $RemoveB'5\' 10"'NCDeqfiD$  (1.778 m)    Weight Status:   Wt Readings from Last 1 Encounters:  10/14/14 187 lb 13.3 oz (85.2 kg)  Admit wt         205 lb (92.9 kg)   Re-estimated needs:  Kcal: 1874 Protein: 90-110g Fluid: 1.9-2.1L/day   Skin: intact   Diet Order: NPO   Intake/Output Summary (Last 24 hours) at 10/14/14 0927 Last data filed at 10/14/14 0900  Gross per 24 hour  Intake 1968.04 ml  Output   3440 ml  Net -1471.96 ml    Last BM: 10/25   Labs:   Recent Labs Lab 10/10/14 0328 10/11/14 0335 10/12/14 0403 10/13/14 0345 10/14/14 0331  NA 139 136*  --  135* 137  K 4.0 4.3  --  4.7 4.5  CL 96 93*  --  98 94*  CO2 35* 32  --  27 32  BUN 16 14  --  20 20  CREATININE 1.04 1.02  --  1.01 1.18  CALCIUM 8.9 9.3  --  9.1 9.3  MG 1.9 1.6 2.1  --   --   PHOS 2.9 3.0 3.2  --   --   GLUCOSE 89 87  --  142* 152*    CBG (last 3)  No results found for this basename: GLUCAP,  in the last 72 hours  Scheduled Meds: . amLODipine  10 mg Oral Daily  . antiseptic oral rinse  7 mL Mouth Rinse QID  . chlorhexidine  15 mL Mouth Rinse BID  . cloNIDine  0.1 mg Oral TID  . docusate  100 mg Per Tube Daily  . doxazosin  2 mg Oral Daily  .  enoxaparin (LOVENOX) injection  40 mg Subcutaneous Q24H  . fluticasone  2 spray Each Nare BID  . furosemide  40 mg Intravenous Q12H  . guaiFENesin  20 mL Per Tube 6 times per day  . Influenza vac split quadrivalent PF  0.5 mL Intramuscular Tomorrow-1000  . ipratropium-albuterol  3 mL Nebulization Q6H  . metoprolol tartrate  25 mg Oral BID  . pantoprazole sodium  40 mg Per Tube Daily  . potassium chloride  30 mEq Per Tube BID  . pravastatin  20 mg Oral q1800  . predniSONE  10 mg Oral Q breakfast   Atlee Abide Taylor RD LDN Clinical Dietitian [id number]

## 2014-10-14 NOTE — Progress Notes (Signed)
Progress Note from the Palliative Medicine Team at [facility]  Subjective: Philip [name] along with his siblings decide to extubate today with the intention to focus on comfort and no re-intubation. Philip [name] indicated that he wants to be extubated today and family supports this decision. He is clearly awake and nods head yes that he wants to be extubated today and no to long term intubation/tracheostomy - this is in line with his previous statements to me. He was placed on pressure support and extubated while maintaining fentanyl infusion as weaning did not go well this morning. This plan was discussed with patient and family. Extubation went well with Philip [name] comfortable and doing well on 4L [place]. We agree to see how he does today and we will continue to assess the next step. I have addressed hospice options with his family and we will assess for appropriateness. They have told me that they are not able to care for him at home. I will follow up in the morning.     Objective: No Known Allergies Scheduled Meds: . amLODipine  10 mg Oral Daily  . antiseptic oral rinse  7 mL Mouth Rinse QID  . chlorhexidine  15 mL Mouth Rinse BID  . cloNIDine  0.1 mg Oral TID  . docusate  100 mg Per Tube Daily  . doxazosin  2 mg Oral Daily  . enoxaparin (LOVENOX) injection  40 mg Subcutaneous Q24H  . feeding supplement (PRO-STAT SUGAR FREE 64)  30 mL Per Tube q morning - 10a  . fluticasone  2 spray Each Nare BID  . furosemide  40 mg Intravenous Q12H  . guaiFENesin  20 mL Per Tube 6 times per day  . Influenza vac split quadrivalent PF  0.5 mL Intramuscular Tomorrow-1000  . ipratropium-albuterol  3 mL Nebulization Q6H  . metoprolol tartrate  25 mg Oral BID  . pantoprazole sodium  40 mg Per Tube Daily  . potassium chloride  30 mEq Per Tube BID  . pravastatin  20 mg Oral q1800  . predniSONE  10 mg Oral Q breakfast   Continuous Infusions: . sodium chloride 250 mL (10/14/14 0600)  . feeding supplement (JEVITY 1.2  CAL) 1,000 mL (10/14/14 0808)  . fentaNYL infusion INTRAVENOUS 50 mcg/hr (10/14/14 0830)  . midazolam (VERSED) infusion Stopped (10/14/14 0100)   PRN Meds:.fentaNYL, hydrALAZINE, midazolam  BP 124/65  Pulse 153  Temp(Src) 99.5 F (37.5 C) (Core (Comment))  Resp 21  Ht 5\' 10"  (1.778 m)  Wt 85.2 kg (187 lb 13.3 oz)  BMI 26.95 kg/m2  SpO2 93%   PPS: 30%  Intake/Output Summary (Last 24 hours) at 10/14/14 1058 Last data filed at 10/14/14 1000  Gross per 24 hour  Intake 2033.04 ml  Output   4265 ml  Net -2231.96 ml      LBM: 10/24  Physical Exam:  General: NAD, extubated to 4L [place], weak HEENT: /AT, no JVD, moist mucous membranes Chest: Diminished in bases, no labored breathing, symmetric CVS: RRR, S1 S2 Abdomen: Soft, NT, ND, +BS Ext: MAE, no edema, warm to touch Neuro: Awake, alert, oriented x 3, follows commands  Labs: CBC    Component Value Date/Time   WBC 15.2* 10/14/2014 0331   RBC 4.39 10/14/2014 0331   HGB 12.9* 10/14/2014 0331   HCT 40.2 10/14/2014 0331   PLT 99* 10/14/2014 0331   MCV 91.6 10/14/2014 0331   MCH 29.4 10/14/2014 0331   MCHC 32.1 10/14/2014 0331   RDW 13.6 10/14/2014 0331  LYMPHSABS 1.1 10/13/2014 0345   MONOABS 0.7 10/13/2014 0345   EOSABS 0.1 10/13/2014 0345   BASOSABS 0.0 10/13/2014 0345    BMET    Component Value Date/Time   NA 137 10/14/2014 0331   K 4.5 10/14/2014 0331   CL 94* 10/14/2014 0331   CO2 32 10/14/2014 0331   GLUCOSE 152* 10/14/2014 0331   BUN 20 10/14/2014 0331   CREATININE 1.18 10/14/2014 0331   CALCIUM 9.3 10/14/2014 0331   GFRNONAA 61* 10/14/2014 0331   GFRAA 71* 10/14/2014 0331    CMP     Component Value Date/Time   NA 137 10/14/2014 0331   K 4.5 10/14/2014 0331   CL 94* 10/14/2014 0331   CO2 32 10/14/2014 0331   GLUCOSE 152* 10/14/2014 0331   BUN 20 10/14/2014 0331   CREATININE 1.18 10/14/2014 0331   CALCIUM 9.3 10/14/2014 0331   PROT 6.3 10/05/2014 0355   ALBUMIN 2.8* 10/05/2014 0355   AST 19  10/05/2014 0355   ALT 29 10/05/2014 0355   ALKPHOS 47 10/05/2014 0355   BILITOT 0.5 10/05/2014 0355   GFRNONAA 61* 10/14/2014 0331   GFRAA 71* 10/14/2014 0331     Assessment and Plan: 1. Code Status: DNR 2. Symptom Control: 1. Dyspnea: Fentanyl infusion titrated for comfort. Guaifenesin every 4 hours.  2. Anxiety: Lorazepam 1 mg every 4 hours prn.  3. Bowel Regimen: Sorbitol daily prn mild constipation. Dulcolax supp daily prn moderate constipation.  3. Psycho/Social: Emotional support provided to patient and family at bedside during difficult discussion and decisions. 4. Spiritual: Support from personal pastor.  5. Disposition: To be determined. Would recommend hospice facility with any further respiratory difficulty and if fentanyl infusion will need to be continued for comfort.    Time In Time Out Total Time Spent with Patient Total Overall Time  1100 1200 50min 60min    Greater than 50%  of this time was spent counseling and coordinating care related to the above assessment and plan.  Yong ChannelAlicia Dellas Guard, NP Palliative Medicine Team Pager # [phone] (M-F 8a-5p) Team Phone # [phone] (Nights/Weekends)

## 2014-10-15 ENCOUNTER — Inpatient Hospital Stay (HOSPITAL_COMMUNITY): Payer: Medicare Other

## 2014-10-15 DIAGNOSIS — J9602 Acute respiratory failure with hypercapnia: Secondary | ICD-10-CM

## 2014-10-15 LAB — BASIC METABOLIC PANEL
ANION GAP: 12 (ref 5–15)
BUN: 16 mg/dL (ref 6–23)
CALCIUM: 9.4 mg/dL (ref 8.4–10.5)
CHLORIDE: 92 meq/L — AB (ref 96–112)
CO2: 33 mEq/L — ABNORMAL HIGH (ref 19–32)
CREATININE: 1.05 mg/dL (ref 0.50–1.35)
GFR calc Af Amer: 82 mL/min — ABNORMAL LOW (ref >90)
GFR, EST NON AFRICAN AMERICAN: 70 mL/min — AB (ref >90)
Glucose, Bld: 124 mg/dL — ABNORMAL HIGH (ref 70–99)
Potassium: 4.1 mEq/L (ref 3.7–5.3)
Sodium: 137 mEq/L (ref 137–147)

## 2014-10-15 LAB — CBC
HCT: 43.4 % (ref 39.0–52.0)
Hemoglobin: 13.8 g/dL (ref 13.0–17.0)
MCH: 29.6 pg (ref 26.0–34.0)
MCHC: 31.8 g/dL (ref 30.0–36.0)
MCV: 93.1 fL (ref 78.0–100.0)
PLATELETS: 88 10*3/uL — AB (ref 150–400)
RBC: 4.66 MIL/uL (ref 4.22–5.81)
RDW: 13.6 % (ref 11.5–15.5)
WBC: 11.3 10*3/uL — AB (ref 4.0–10.5)

## 2014-10-15 MED ORDER — MORPHINE SULFATE 2 MG/ML IJ SOLN
1.0000 mg | INTRAMUSCULAR | Status: DC | PRN
  Administered 2014-10-15 – 2014-10-16 (×2): 1 mg via INTRAVENOUS
  Filled 2014-10-15 (×2): qty 1

## 2014-10-15 NOTE — Progress Notes (Signed)
Progress Note from the Palliative Medicine Team at [facility]  Subjective: I spoke with Philip [name], "Philip [name]," this morning and we discussed how he wants to spend his time. 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 He is now at a place of more acceptance and expresses a desire to leave the hospital and make the best of the time he has left. We discussed hospice options with him and his family. He tells me that he has had friends at [facility] and that this would be his first choice to spend the rest of his time. We discussed hospice philosophy and that they will not be using telemetry, IV medications/antibiotics, labwork/testing but will be taking care of him to make sure his is comfortable and his needs are met for the time he has left. He was extubated yesterday and it is only a matter of time before he tires out again - lasted 2 weeks between intubations in hospital - and without intubation (he is clear now that he does NOT want this again) he will pass but will need aggressive symptom management. He required a fentanyl infusion at 200 mcg/hr for extubation yesterday but this has been titrated off and transitioned to prn morphine for respiratory distress. Philip [name] is now more at peace with his decisions for comfort. He is surrounded by numerous brothers and sisters that are extremely supportive with sister [name] being his decision surrogate. They all tell me that he has "been their rock" and he is always one to focus on others and not himself. Prognosis is very poor and likely < 2-3 weeks with a focus on comfort.    Objective: No Known Allergies Scheduled Meds: . antiseptic oral rinse  7 mL Mouth Rinse QID  . chlorhexidine  15 mL Mouth Rinse BID  . cloNIDine  0.1 mg Oral TID  . doxazosin  2 mg Oral Daily  . enoxaparin (LOVENOX) injection  40 mg Subcutaneous Q24H  .  fluticasone  2 spray Each Nare BID  . guaiFENesin  20 mL Oral 6 times per day  . Influenza vac split quadrivalent PF  0.5 mL Intramuscular Tomorrow-1000  . ipratropium-albuterol  3 mL Nebulization Q6H  . metoprolol tartrate  25 mg Oral BID  . pantoprazole (PROTONIX) IV  40 mg Intravenous Daily  . potassium chloride  30 mEq Oral BID  . pravastatin  20 mg Oral q1800  . predniSONE  10 mg Oral Q breakfast   Continuous Infusions: . sodium chloride 250 mL (10/14/14 0600)   PRN Meds:.bisacodyl, LORazepam, morphine injection, sorbitol  BP 114/61  Pulse 92  Temp(Src) 97.5 F (36.4 C) (Core (Comment))  Resp 23  Ht 5' 10" (1.778 m)  Wt 81.6 kg (179 lb 14.3 oz)  BMI 25.81 kg/m2  SpO2 96%   PPS: 30%   Intake/Output Summary (Last 24 hours) at 10/15/14 1212 Last data filed at 10/15/14 0700  Gross per 24 hour  Intake 557.67 ml  Output    495 ml  Net  62.67 ml      LBM: 10/26     Physical Exam:  General: NAD, weakened  HEENT: Philip [name]/AT, no JVD, moist mucous membranes  Chest: Diminished in bases, no labored breathing, symmetric  CVS: RRR, S1 S2  Abdomen: Soft, NT, ND, +BS, colostomy  Ext: MAE, no edema, warm to touch  Neuro: Awake, alert, oriented x 3 but seems somewhat   confused in conversation at times, follows commands  Labs: CBC    Component Value Date/Time   WBC 11.3* 10/15/2014 0321   RBC 4.66 10/15/2014 0321   HGB 13.8 10/15/2014 0321   HCT 43.4 10/15/2014 0321   PLT 88* 10/15/2014 0321   MCV 93.1 10/15/2014 0321   MCH 29.6 10/15/2014 0321   MCHC 31.8 10/15/2014 0321   RDW 13.6 10/15/2014 0321   LYMPHSABS 1.1 10/13/2014 0345   MONOABS 0.7 10/13/2014 0345   EOSABS 0.1 10/13/2014 0345   BASOSABS 0.0 10/13/2014 0345    BMET    Component Value Date/Time   NA 137 10/15/2014 0321   K 4.1 10/15/2014 0321   CL 92* 10/15/2014 0321   CO2 33* 10/15/2014 0321   GLUCOSE 124* 10/15/2014 0321   BUN 16 10/15/2014 0321   CREATININE 1.05 10/15/2014 0321   CALCIUM 9.4  10/15/2014 0321   GFRNONAA 70* 10/15/2014 0321   GFRAA 82* 10/15/2014 0321    CMP     Component Value Date/Time   NA 137 10/15/2014 0321   K 4.1 10/15/2014 0321   CL 92* 10/15/2014 0321   CO2 33* 10/15/2014 0321   GLUCOSE 124* 10/15/2014 0321   BUN 16 10/15/2014 0321   CREATININE 1.05 10/15/2014 0321   CALCIUM 9.4 10/15/2014 0321   PROT 6.3 10/05/2014 0355   ALBUMIN 2.8* 10/05/2014 0355   AST 19 10/05/2014 0355   ALT 29 10/05/2014 0355   ALKPHOS 47 10/05/2014 0355   BILITOT 0.5 10/05/2014 0355   GFRNONAA 70* 10/15/2014 0321   GFRAA 82* 10/15/2014 0321     Assessment and Plan: 1. Code Status: DNR 2. Symptom Control: 1. Dyspnea/pain: Morphine 1 mg every 2 hours prn. May titrate dosage/frequency as needed.  2. Anxiety: Lorazepam 1 mg every 4 hours prn.  3. Bowel Regimen: Sorbitol daily prn.  3. Psycho/Social: Emotional support provided to patient and family at bedside.  4. Disposition: Hopeful for hospice facility.     Time In Time Out Total Time Spent with Patient Total Overall Time  0840 0930 7mn 530m    Greater than 50%  of this time was spent counseling and coordinating care related to the above assessment and plan.  AlVinie SillNP Palliative Medicine Team Pager # [phone]-F 8a-5p) Team Phone # [phone]/Weekends)

## 2014-10-15 NOTE — Progress Notes (Signed)
WIS 40ml Of IV Fentanyl wasted with Angela,RN.

## 2014-10-15 NOTE — Progress Notes (Signed)
PULMONARY / CRITICAL CARE MEDICINE   Name: Philip [name] MRN: [id number] DOB: [birth year]  PCP Jerlyn Ly, MD    ADMISSION DATE:  09/24/2014 CONSULTATION DATE:  10/15/2014  REFERRING MD :  EDP  CHIEF COMPLAINT:  SOB  INITIAL PRESENTATION: [name] brought to [facility] ED on 10/6 with SOB, cough, dizziness.  In ED, CXR suggestive of PNA.  He failed BiPAP and required intubation, PCCM consulted for admission.  STUDIES:  CXR 10/6 >> vascular congestion, b/l pleural effusions, RLL atx vs infiltrate. TTE 10/7 >> EF 60-65%, mod concentric hypertrophy, doppler parameters c/w gd I diastolic dysfxn   SIGNIFICANT EVENTS: 10/6  presented to ED, started on BiPAP but failed, required intubation. 10/7  ETT repositioned with poor volume return on vent, to 25 cm at teeth 10/8. Passed SBT, fully awake. Extubated.  10/9 hypertensive. Very congested cough, but no distress.  10/11: desaturations overnight remains on bipap 10/12: patient placed on bipap for increased WOB/RR. Patient reports distress. Using biPAP throughout day.  10/14: up in chair. Looking better. PT and OT suggesting SNF.  10/15 FIO2 needs increased. Feels worse.  10/16: persistent low volumes  10/04/14 No sig clinical change. Feels about the same  10/05/14: Per  RN: refusing meds and thismight be a change. Was in chair yesterday but today in bed and? A bit more sleepy Denies any complaints. But was seen retching for several mintues  10/19 On NIMVS overnight. Try off nimvs and check abg 10/19 met with palliative care and is a partial code, no intubation but ok for cpr, shock, chemicals.  10/20 used nimvs during the night. 10/20 full code per his request via palliative care follow up. 10/22 increased fio2 needs. Looks weaker. Will attempt aggressive diuresis . 10/22 required intubation for progressive resp failure. 10/26 Family meeting with palliative care and terminal wean started.  10/27 placed on nimvs for confusion. Remains on  fentanyl drip.  SUBJECTIVE/OVERNIGHT/INTERVAL HX Arouses to voice. On Bipap Later eval > comfortable on [place] O2  VITAL SIGNS: Temp:  [97.9 F (36.6 C)-100 F (37.8 C)] 99 F (37.2 C) (10/27 0400) Pulse Rate:  [72-153] 76 (10/27 0532) Resp:  [13-23] 15 (10/27 0532) BP: (93-158)/(55-85) 93/56 mmHg (10/27 0532) SpO2:  [89 %-100 %] 100 % (10/27 0532) FiO2 (%):  [30 %-45 %] 40 % (10/27 0532) Weight:  [179 lb 14.3 oz (81.6 kg)] 179 lb 14.3 oz (81.6 kg) (10/27 0400) Vent Mode:  [-] BIPAP;PCV FiO2 (%):  [30 %-45 %] 40 % Set Rate:  [12 bmp-15 bmp] 15 bmp Vt Set:  [510 mL] 510 mL PEEP:  [5 cmH20] 5 cmH20 Plateau Pressure:  [18 cmH20] 18 cmH20  INTAKE / OUTPUT: Intake/Output     10/26 0701 - 10/27 0700 10/27 0701 - 10/28 0700   P.O. 30    I.V. (mL/kg) 668.9 (8.2)    NG/GT 370    Total Intake(mL/kg) 1068.9 (13.1)    Urine (mL/kg/hr) 1710 (0.9)    Stool 110 (0.1)    Total Output 1820     Net -751.1            PHYSICAL EXAMINATION: General: Elderly AAM, Neuro: rass 0, awake, alert, non-focal HEENT:  wnl JVD. ETT Cardiovascular: s1 s2 RRR,  Lung : CTA, diminished in bases, on nimvs Abdomen: BS x 4, soft, NT/ND, colostomy wnl Musculoskeletal: minimal edema Skin: Intact, warm, dry  LABS: PULMONARY  Recent Labs Lab 10/10/14 1614 10/10/14 2028 10/12/14 0242  PHART 7.573* 7.481* 7.486*  PCO2ART  37.4 46.5* 41.3  PO2ART 122.0* 110.0* 98.2  HCO3 34.6* 34.3* 30.9*  TCO2 29.2 29.6 26.8  O2SAT 99.0 98.5 97.8    CBC  Recent Labs Lab 10/13/14 0345 10/14/14 0331 10/15/14 0321  HGB 13.1 12.9* 13.8  HCT 40.8 40.2 43.4  WBC 11.2* 15.2* 11.3*  PLT 88* 99* 88*    COAGULATION No results found for this basename: INR,  in the last 168 hours  CARDIAC  No results found for this basename: TROPONINI,  in the last 168 hours No results found for this basename: PROBNP,  in the last 168 hours   CHEMISTRY  Recent Labs Lab 10/09/14 0322 10/10/14 0328 10/11/14 0335  10/12/14 0403 10/13/14 0345 10/14/14 0331 10/15/14 0321  NA 139 139 136*  --  135* 137 137  K 4.7 4.0 4.3  --  4.7 4.5 4.1  CL 98 96 93*  --  98 94* 92*  CO2 31 35* 32  --  27 32 33*  GLUCOSE 103* 89 87  --  142* 152* 124*  BUN $Re'22 16 14  'TZX$ --  $R'20 20 16  'qh$ CREATININE 1.16 1.04 1.02  --  1.01 1.18 1.05  CALCIUM 8.8 8.9 9.3  --  9.1 9.3 9.4  MG 1.6 1.9 1.6 2.1  --   --   --   PHOS 3.3 2.9 3.0 3.2  --   --   --    Estimated Creatinine Clearance: 68.6 ml/min (by C-G formula based on Cr of 1.05).   LIVER No results found for this basename: AST, ALT, ALKPHOS, BILITOT, PROT, ALBUMIN, INR,  in the last 168 hours   INFECTIOUS No results found for this basename: LATICACIDVEN, PROCALCITON,  in the last 168 hours   ENDOCRINE CBG (last 3)  No results found for this basename: GLUCAP,  in the last 72 hours   IMAGING x48h Dg Chest Port 1 View  10/15/2014   CLINICAL DATA:  Respiratory failure  EXAM: PORTABLE CHEST - 1 VIEW  COMPARISON:  October 14, 2014  FINDINGS: Endotracheal tube and nasogastric tube have been removed. There is no apparent pneumothorax. There is persistent bibasilar atelectatic change. There is mild consolidation in the left base, stable. Elsewhere lungs are clear. Heart size and pulmonary vascular normal. No adenopathy. No bone lesions.  IMPRESSION: No apparent pneumothorax. Persistent left base consolidation with bibasilar atelectatic change. No appreciable new opacity. No change in cardiac silhouette.   Electronically Signed   By: Lowella Grip M.D.   On: 10/15/2014 07:14   Dg Chest Port 1 View  10/14/2014   CLINICAL DATA:  Acute respiratory acidosis.  EXAM: PORTABLE CHEST - 1 VIEW  COMPARISON:  10/13/2014 and CT chest 10/04/2014.  FINDINGS: Endotracheal tube terminates at the level of the clavicular heads, well above the carina. Nasogastric tube is followed into the stomach. Heart size is grossly stable. Lungs are low in volume with bibasilar collapse/consolidation,  stable. There may be small bilateral pleural effusions. Biapical pleural thickening, right greater than left.  IMPRESSION: 1. Lungs are low in volume with bibasilar collapse/consolidation, stable. 2. Suspect small bilateral pleural effusions.   Electronically Signed   By: Lorin Picket M.D.   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 For comfort. Would avoid in the future   CARDIOVASCULAR A:  Hx HTN, HLD - BNP, lactate, cortisol, troponin wnl HTN   - tachycardic after dc coreg on 10./19/15, better 10/22 with bb restarted P:  pravachol home meds.  Restart BP regimen if he is able to tolerate Lasix dosed daily  RENAL Lab Results  Component Value Date   CREATININE 1.05 10/15/2014   CREATININE 1.18 10/14/2014   CREATININE 1.01 10/13/2014    Recent Labs Lab 10/13/14 0345 10/14/14 0331 10/15/14 0321  NA 135* 137 137     A:   Acute kidney injury resolved hypernatremia resolved  P:    kvo Lasix to even balance  GASTROINTESTINAL A:   GI prophylaxis Nutrition  P:   reglan prn starting diet Colostomy care  HEMATOLOGIC A:   VTE Prophylaxis Polycythemia, likely secondary to respiratory disease Mild new thrombocytopenia P:  SCD's / Lovenox, follow plat , may need to hold if drops further   INFECTIOUS A:   Leukocytosis CAP - left consolidation: rhinovirus w/ prob bacterial superinfection  Enterococcus UTI  P:   BCx2 10/7 >> neg  UCx 10/7 >> enterococcus (amp sens) RVP 10/7 >> + rhinovirus   Changed to oral levaquin 10/9>>>10/15 Vanc 10/15>>>10/20   ceftaz 10/15>>>10/24  Monitor off abx, completed course  ENDOCRINE  A:   DM   P:   SSI  140-180 goals   NEUROLOGIC A:   Acute metabolic encephalopathy-->resolved.  Deconditioning. Vent dyschony P:      Palliative care meet:   10/26 1130 am. Palliative care has met with family and terminal extubation instituted. 10/27 he is on NIMVS and fentanyl drip. Take off NIMVS, consider transfer to floor for continued palliation. Consider holding all non comfort medications. Appreciate A. Parker's assistance. Suspect he will go to [facility], possibly home w Hospice.   Richardson Landry Minor ACNP Maryanna Shape PCCM Pager [phone] till 3 pm If no answer page [phone] 10/15/2014, 8:01 AM   Baltazar Apo, MD, PhD 10/15/2014, 12:45 PM [facility] [phone] or if no answer [phone]

## 2014-10-15 NOTE — Progress Notes (Signed)
CSW continuing to follow.  CSW received notification from [facility], Erling Conte that pt has bed available at [facility] for tomorrow. [facility] liaison, Erling Conte reports that she has notified pt sister, Enid Derry and plans is to meet with pt and pt family tomorrow to complete paperwork.  CSW met with pt at bedside and notified pt. Pt agreeable to transition to [facility].  CSW provided Gold DNR in shadow chart, MD please sign.  RN aware of [place] bed available Wednesday 2014-10-23.   CSW to facilitate pt discharge needs to [facility].   [name], MSW, [place] Work [phone]

## 2014-10-15 NOTE — Progress Notes (Signed)
Pt is more alert and talkative than he was a few hours ago. He is capable of answering questions appropriately and requested that his bed be placed in a position similar to a recliner. HOB at 90 with feet elevated and knee area lowered. Meds administered. Pt sitting in bed watching television. Pt denies "pain all over" like before, but does state some mild pain in his right leg. Alert and oriented. VSS. Will continue to monitor.

## 2014-10-15 NOTE — Progress Notes (Signed)
Patient with decreased responsiveness and sliding to the bottom of the bed again. Patient only responds to verbal stimuli after multiple attempts. Pt states that he is "hurting all over" again but unable to describe pain or give any further description. Hans, RN at bedside and assisted with repositioning patient multiple times. Patient consistently scoots himself to the bottom of the bed even with foot of bed elevated. He is unable to verbalize why he is doing this and denies that he feels better this way or when he is repositioned. Patient also has tachy, labored breathing with accessory muscle use and occasional grunting. Once you are able to get Philip [name] to arouse and open his eyes, he is capable of following commands and is A&O but it is very temporary. Hans contacted RT to see if an ABG would be necessary but the patient is a DNR and possibly palliative. Philip [name] did agree to attempt the Bipap if needed. Hans notified MD to get an order to attempt bipap and also gave a bolus from his Fentanyl drip (MD aware). Bipap applied and respirations improve but no change in mental status. Patient is continuing to slide, has eyes closed or rolled up into head, arms are rigid at times, and he requires multiple attempts to arouse or follow commands. Ativan 1mg  given by Lisabeth RegisterHans, RN [facility] for [facility]. About 5 minutes after administration, patient opens his eyes and begins to respond appropriately. He states that he does remember scooting down in the bed, denies every experiencing pain, and begins to talk coherently about his time in the hospital. Unsure of the direct cause of this episode. Bipap is on and patient is resting peacefully at this time. Will continue to monitor. VSS.

## 2014-10-15 NOTE — Progress Notes (Signed)
CSW continuing to follow.  CW received phone call from PMT NP, Vinie Sill who met with pt and pt family today. Pt was extubated yesterday. Per PMT NP, Vinie Sill, pt and pt family agreeable to transition to comfort care. Per PMT NPC, Vinie Sill pt residential hospice appropriate and interested in [facility].   CSW met with pt at bedside. No family present at this time. CSW discussed with pt regarding residential hospice placement. CSW provided list of residential hospice facilities. Pt shared that his first choice would be [facility]. CSW discussed referral process and pt expressed understanding. Pt agreeable to CSW contacting pt sister, Enid Derry.  CSW contacted pt sister, Enid Derry to discuss residential hospice placement. CSW discussed with pt sister that pt first choice is [facility]. CSW discussed with pt sister that CSW left list of hospice facilities at pt bedside. CSW discussed that CSW will make referral to [facility], but pt and pt family will need to review list and consider second option in the instance that [facility] is full. Pt sister expressed understanding.  CSW made referral to [facility], Erling Conte.  CSW to continue to follow to provide support and assist with pt discharge planning needs to residential hospice.   [name], MSW, [place] Work [phone]

## 2014-10-15 NOTE — Progress Notes (Signed)
Patient has been restless at times. He scoots himself to the end of the bed. He has been pulled up and repositioned multiple times on my shift. Per report from day shift, he was sliding to the foot of the bed on day shift as well. [name] seems to be less responsive and more drowsy than he was at the beginning of my shift. He is more responsive when called by his nickname "Philip [name]" than he is by "[name]" or "Philip [name]". He is still capable of following most commands but voices that he feels very weak. While we were in his room helping to reposition him, he would slide down again this time before we could even get out of the room. He was unable to explain to Philip [name] why, although he did mention that he is "hurting all over". When asked what we could do to help him, he would not reply. He is still currently receiving the Fentanyl drip @20  ml/hr. During this time of movement and sliding, his RR was noted to get up to 32 although his oxygen sats were still 100. [name] stated that he is "tired" and requested for me to call and tell his sister. He wanted me to tell her that he was tired and hurting, but he declined for her to come in. I called his sister Philip [name] at 12:12 am with no response. I left a message with my extension. His brother Philip [name] called back at 12:15 am and I delivered the message to him. He stated that Philip [name] was asleep and that she would be here in the morning. Philip [name] called back herself at 12:25 am. I told her the message along with status of [name]. She requested that he is told that she loves him, "It's okay", and that she will be here in the morning. She was very emotional. I delivered the message to [name]. He did not respond. Will continue to monitor.

## 2014-10-16 MED ORDER — PREDNISONE 10 MG PO TABS: 10.0000 mg | ORAL_TABLET | Freq: Every day | ORAL | Status: AC

## 2014-10-16 MED ORDER — LORAZEPAM 2 MG/ML IJ SOLN: 1.0000 mg | INTRAMUSCULAR | Status: AC | PRN

## 2014-10-16 MED ORDER — IPRATROPIUM-ALBUTEROL 0.5-2.5 (3) MG/3ML IN SOLN: 3.0000 mL | Freq: Four times a day (QID) | RESPIRATORY_TRACT | Status: AC

## 2014-10-16 MED ORDER — METOPROLOL TARTRATE 25 MG PO TABS: 25.0000 mg | ORAL_TABLET | Freq: Two times a day (BID) | ORAL | Status: AC

## 2014-10-16 MED ORDER — SORBITOL 70 % SOLN: 30.0000 mL | Freq: Every day | Status: AC | PRN

## 2014-10-16 MED ORDER — MORPHINE SULFATE 2 MG/ML IJ SOLN: 1.0000 mg | INTRAMUSCULAR | Status: AC | PRN

## 2014-10-20 NOTE — Consult Note (Signed)
[facility] Liaison: [facility] room available for Mr. Manson PasseyBrown this morning. Meeting with sister Talbert ForestShirley to complete registration paper work at [employer]. Family requesting Dr. Waynard EdwardsPerini to assume [facility]. Await call back from Dr. Laurey MoralePerini's office to confirm. Please fax DC summary to [phone] and arrange transport for Mr. Manson PasseyBrown to arrive before noon. RN please call report to [phone]. Thank you. Forrestine Himva Sonita Michiels LCSW [phone]

## 2014-10-20 NOTE — Progress Notes (Signed)
[name] is more confused this morning but awake and conversing with me. Nursing says that he has been restless/anxious at times but the ativan in combination with morphine seems to help put him at ease. He has received this combination this morning and is now able to sit up and eat his breakfast, feeding himself, and tells me by number the channel on TV that he prefers. I left him comfortably eating his breakfast. Noted family meeting with hospice this morning with [facility]. Please call with any further questions/concerns.  Yong ChannelAlicia Deckard Stuber, NP Palliative Medicine Team Pager # [phone] (M-F 8a-5p) Team Phone # [phone] (Nights/Weekends)

## 2014-10-20 NOTE — Progress Notes (Signed)
PULMONARY / CRITICAL CARE MEDICINE   Name: Philip [name] MRN: [id number] DOB: 1946-07-13  PCP Jerlyn Ly, MD    ADMISSION DATE:  09/24/2014 CONSULTATION DATE:  October 25, 2014  REFERRING MD :  EDP  CHIEF COMPLAINT:  SOB  INITIAL PRESENTATION: [name] brought to [facility] ED on 10/6 with SOB, cough, dizziness.  In ED, CXR suggestive of PNA.  He failed BiPAP and required intubation, PCCM consulted for admission.  STUDIES:  CXR 10/6 >> vascular congestion, b/l pleural effusions, RLL atx vs infiltrate. TTE 10/7 >> EF 60-65%, mod concentric hypertrophy, doppler parameters c/w gd I diastolic dysfxn   SIGNIFICANT EVENTS: 10/6  presented to ED, started on BiPAP but failed, required intubation. 10/7  ETT repositioned with poor volume return on vent, to 25 cm at teeth 10/8. Passed SBT, fully awake. Extubated.  10/9 hypertensive. Very congested cough, but no distress.  10/11: desaturations overnight remains on bipap 10/12: patient placed on bipap for increased WOB/RR. Patient reports distress. Using biPAP throughout day.  10/14: up in chair. Looking better. PT and OT suggesting SNF.  10/15 FIO2 needs increased. Feels worse.  10/16: persistent low volumes  10/04/14 No sig clinical change. Feels about the same  10/05/14: Per  RN: refusing meds and thismight be a change. Was in chair yesterday but today in bed and? A bit more sleepy Denies any complaints. But was seen retching for several mintues  10/19 On NIMVS overnight. Try off nimvs and check abg 10/19 met with palliative care and is a partial code, no intubation but ok for cpr, shock, chemicals.  10/20 used nimvs during the night. 10/20 full code per his request via palliative care follow up. 10/22 increased fio2 needs. Looks weaker. Will attempt aggressive diuresis . 10/22 required intubation for progressive resp failure. 10/26 Family meeting with palliative care and terminal wean started.  10/27 placed on nimvs for confusion. Remains on  fentanyl drip. 10/27 family meet with Palliative care, [facility] for hosp[facility] care. 10/28 Await clearance for dc to [employer] [facility].   SUBJECTIVE/OVERNIGHT/INTERVAL HX NAD, comfortable   VITAL SIGNS: Temp:  [96.8 F (36 C)-99 F (37.2 C)] 98.2 F (36.8 C) (10/28 0600) Pulse Rate:  [85-110] 85 (10/28 0600) Resp:  [16-33] 19 (10/28 0600) BP: (99-152)/(59-81) 122/64 mmHg (10/28 0600) SpO2:  [95 %-100 %] 98 % (10/28 0835) Weight:  [184 lb 1.4 oz (83.5 kg)] 184 lb 1.4 oz (83.5 kg) (10/28 0400)    INTAKE / OUTPUT: Intake/Output     10/27 0701 - 10/28 0700 10/28 0701 - 10/29 0700   P.O.     I.V. (mL/kg)     NG/GT     Total Intake(mL/kg)     Urine (mL/kg/hr) 380 (0.2)    Stool 1 (0)    Total Output 381     Net -381            PHYSICAL EXAMINATION: General: Elderly AAM, Neuro: rass 1, awake, alert, non-focal HEENT:  wnl JVD. ETT Cardiovascular: s1 s2 RRR,  Lung : CTA, diminished in bases, sats 92% off O2 Abdomen: BS x 4, soft, NT/ND, colostomy wnl Musculoskeletal: minimal edema Skin: Intact, warm, dry  LABS: PULMONARY  Recent Labs Lab 10/10/14 1614 10/10/14 2028 10/12/14 0242  PHART 7.573* 7.481* 7.486*  PCO2ART 37.4 46.5* 41.3  PO2ART 122.0* 110.0* 98.2  HCO3 34.6* 34.3* 30.9*  TCO2 29.2 29.6 26.8  O2SAT 99.0 98.5 97.8    CBC  Recent Labs Lab 10/13/14 0345 10/14/14 0331 10/15/14 0321  HGB 13.1 12.9* 13.8  HCT 40.8 40.2 43.4  WBC 11.2* 15.2* 11.3*  PLT 88* 99* 88*    COAGULATION No results found for this basename: INR,  in the last 168 hours  CARDIAC  No results found for this basename: TROPONINI,  in the last 168 hours No results found for this basename: PROBNP,  in the last 168 hours   CHEMISTRY  Recent Labs Lab 10/10/14 0328 10/11/14 0335 10/12/14 0403 10/13/14 0345 10/14/14 0331 10/15/14 0321  NA 139 136*  --  135* 137 137  K 4.0 4.3  --  4.7 4.5 4.1  CL 96 93*  --  98 94* 92*  CO2 35* 32  --  27 32 33*  GLUCOSE 89 87  --  142*  152* 124*  BUN 16 14  --  _0 CREATININE 1.04 1.02  --  1.01 1.18 1.05  CALCIUM 8.9 9.3  --  9.1 9.3 9.4  MG 1.9 1.6 2.1  --   --   --   PHOS 2.9 3.0 3.2  --   --   --    Estimated Creatinine Clearance: 68.6 ml/min (by C-G formula based on Cr of 1.05).   LIVER No results found for this basename: AST, ALT, ALKPHOS, BILITOT, PROT, ALBUMIN, INR,  in the last 168 hours   INFECTIOUS No results found for this basename: LATICACIDVEN, PROCALCITON,  in the last 168 hours   ENDOCRINE CBG (last 3)  No results found for this basename: GLUCAP,  in the last 72 hours   IMAGING x48h Dg Chest Port 1 View  10/15/2014   CLINICAL DATA:  Respiratory failure  EXAM: PORTABLE CHEST - 1 VIEW  COMPARISON:  October 14, 2014  FINDINGS: Endotracheal tube and nasogastric tube have been removed. There is no apparent pneumothorax. There is persistent bibasilar atelectatic change. There is mild consolidation in the left base, stable. Elsewhere lungs are clear. Heart size and pulmonary vascular normal. No adenopathy. No bone lesions.  IMPRESSION: No apparent pneumothorax. Persistent left base consolidation with bibasilar atelectatic change. No appreciable new opacity. No change in cardiac silhouette.   Electronically Signed   By: Lowella Grip M.D.   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 For comfort. Would avoid in the future  Continue nasal cannula  CARDIOVASCULAR A:  Hx HTN, HLD - BNP, lactate, cortisol, troponin wnl HTN   - tachycardic after dc coreg on 10./19/15, better 10/22 with bb  restarted P:  pravachol home meds.  Restart BP regimen if he is able to tolerate Lasix dc'd  RENAL Lab Results  Component Value Date   CREATININE 1.05 10/15/2014   CREATININE 1.18 10/14/2014   CREATININE 1.01 10/13/2014    Recent Labs Lab 10/13/14 0345 10/14/14 0331 10/15/14 0321  NA 135* 137 137     A:   Acute kidney injury resolved hypernatremia resolved  P:    kvo   GASTROINTESTINAL A:   GI prophylaxis Nutrition  P:   reglan prn starting diet Colostomy care  HEMATOLOGIC A:   VTE Prophylaxis Polycythemia, likely secondary to respiratory disease Mild  new thrombocytopenia P:  SCD's / Lovenox,    INFECTIOUS A:   Leukocytosis CAP - left consolidation: rhinovirus w/ prob bacterial superinfection  Enterococcus UTI  P:   BCx2 10/7 >> neg  UCx 10/7 >> enterococcus (amp sens) RVP 10/7 >> + rhinovirus   Changed to oral levaquin 10/9>>>10/15 Vanc 10/15>>>10/20  ceftaz 10/15>>>10/24  Monitor off abx, completed course  ENDOCRINE  A:   DM   P:   SSI  140-180 goals   NEUROLOGIC A:   Acute metabolic encephalopathy-->resolved.  Deconditioning.  P:   [place]   Palliative care meet:   10/26 1130 am. Palliative care has met with family and terminal extubation instituted. 10/27 he is on NIMVS and fentanyl drip. Take off NIMVS, consider transfer to floor for continued palliation. Consider holding all non comfort medications. Appreciate A. Parker's assistance. Suspect he will go to [place] place.. Will leave in ICU today, if not moved to [facility] place will transfer to floor.  Richardson Landry Minor ACNP Maryanna Shape PCCM Pager [phone] till 3 pm If no answer page [phone] 10/25/2014, 8:43 AM   Baltazar Apo, MD, PhD Oct 25, 2014, 11:24 AM [place] [facility] [phone] or if no answer [phone]

## 2014-10-20 NOTE — Progress Notes (Signed)
Report given to Therapist, nutritionalherry RN at [facility]. Patient is stable at discharge. Patient's assessment has not changed from am.  Patient's discharge is arranged by Child psychotherapistocial Worker. Sisters are at the bedside.

## 2014-10-20 NOTE — Progress Notes (Addendum)
Clinical Social Work Department CLINICAL SOCIAL WORK PLACEMENT NOTE 10/04/2014  Patient:  Philip [name],[name]  Account Number:  [id number] Admit date:  09/24/2014  Clinical Social Worker:  Jacelyn GripSUZANNA BYRD, LCSWA  Date/time:  10/04/2014 10:32 AM  Clinical Social Work is seeking post-discharge placement for this patient at the following level of care:   SKILLED NURSING   (*CSW will update this form in Epic as items are completed)   10/04/2014  Patient/family provided with [facility] Department of Clinical Social Work's list of facilities offering this level of care within the geographic area requested by the patient (or if unable, by the patient's family).  10/04/2014  Patient/family informed of their freedom to choose among providers that offer the needed level of care, that participate in Medicare, Medicaid or managed care program needed by the patient, have an available bed and are willing to accept the patient.  10/04/2014  Patient/family informed of [facility]' ownership interest in [facility], as well as of the fact that they are under no obligation to receive care at this facility.  PASARR submitted to EDS on 10/04/2014 PASARR number received on 10/04/2014  FL2 transmitted to all facilities in geographic area requested by pt/family on  10/04/2014 FL2 transmitted to all facilities within larger geographic area on   Patient informed that his/her managed care company has contracts with or will negotiate with  certain facilities, including the following:     Patient/family informed of bed offers received:  Bed offers not provided for SNF as pt declined and transitioned to comfort care; [facility] and pt family [facility] Patient [facility] Physician recommends and patient chooses bed at    [facility]2014 Patient to be transferred to facility by ambulance Sharin Mons([facility]) Patient and family notified of transfer on  06-Jan-2014 Name of family member notified:  Pt notified at bedside and pt sister, Talbert ForestShirley notified at bedside  The following physician request were entered in Epic:   Additional Comments:   Loletta SpecterSuzanna Jaymes Revels, MSW, LCSW Clinical Social Work [phone]

## 2014-10-20 NOTE — Discharge Summary (Signed)
Physician Discharge Summary  Patient ID: Philip [name] MRN: [id number] DOB/AGE: [birth year] 69 y.o.  Admit date: 09/24/2014 Discharge date: 11-01-2014  Problem List Active Problems:   HTN (hypertension)   Diabetes type 2, controlled   Acute on chronic respiratory failure   CAP (community acquired pneumonia)   COPD exacerbation   Atelectasis   Enterococcus UTI   OSA (obstructive sleep apnea)   AKI (acute kidney injury)   Polycythemia, secondary   Physical deconditioning   Palliative care encounter   Dyspnea  HPI: Pt is encephalopathic; therefore, this HPI is obtained from chart review.  Philip [name] is a [name] with PMH as outlined below who presented to [facility] ED on 10/6 with SOB, cough, and dizziness. Per family, pt had been having a cough for the past few days. No recent fevers/chills/sweats, chest pain, N/V/D, abdominal pain. No recent travel or exposure to known sick contacts. In ED, SpO2 was 67% on RA and pt was subsequently placed on a NRB. ABG revealed respiratory acidosis, pt was started on BiPAP; however, he failed and became more lethargic therefore, he was intubated by EDP.  PCCM was consulted for admission.  Hospital Course: STUDIES:  CXR 10/6 >> vascular congestion, b/l pleural effusions, RLL atx vs infiltrate.  TTE 10/7 >> EF 60-65%, mod concentric hypertrophy, doppler parameters c/w gd I diastolic dysfxn  SIGNIFICANT EVENTS:  10/6 presented to ED, started on BiPAP but failed, required intubation.  10/7 ETT repositioned with poor volume return on vent, to 25 cm at teeth  10/8. Passed SBT, fully awake. Extubated.  10/9 hypertensive. Very congested cough, but no distress.  10/11: desaturations overnight remains on bipap  10/12: patient placed on bipap for increased WOB/RR. Patient reports distress. Using biPAP throughout day.  10/14: up in chair. Looking better. PT and OT suggesting SNF.  10/15 FIO2 needs increased. Feels worse.  10/16: persistent low volumes  10/04/14  No sig clinical change. Feels about the same  10/05/14: Per RN: refusing meds and thismight be a change. Was in chair yesterday but today in bed and? A bit more sleepy Denies any complaints. But was seen retching for several mintues  10/19 On NIMVS overnight. Try off nimvs and check abg  10/19 met with palliative care and is a partial code, no intubation but ok for cpr, shock, chemicals.  10/20 used nimvs during the night.  10/20 full code per his request via palliative care follow up.  10/22 increased fio2 needs. Looks weaker. Will attempt aggressive diuresis .  10/22 required intubation for progressive resp failure.  10/26 Family meeting with palliative care and terminal wean started.  10/27 placed on nimvs for confusion. Remains on fentanyl drip.  10/27 family meet with Palliative care, [facility] care.  10/28  Clearance for dc to [place] obtained and he will dc there today.   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 P:  Extubated 10/26, marginal but stable  Cont prednisone  Continue BD's   Continue nasal cannula   CARDIOVASCULAR  A:  Hx HTN, HLD - BNP, lactate, cortisol, troponin wnl  HTN  - tachycardic after dc coreg on  10./19/15, better 10/22 with bb restarted  P:    See dc orders RENAL  Lab Results   Component  Value  Date    CREATININE  1.05  10/15/2014    CREATININE  1.18  10/14/2014    CREATININE  1.01  10/13/2014     Recent Labs  Lab  10/13/14 0345  10/14/14 0331  10/15/14 0321   NA  135*  137  137    A:  Acute kidney injury resolved  hypernatremia resolved  P:  kvo  GASTROINTESTINAL  A:  GI  prophylaxis  Nutrition  P:  reglan prn  starting diet  Colostomy care  HEMATOLOGIC  A:  VTE Prophylaxis  Polycythemia, likely secondary to respiratory disease  Mild new thrombocytopenia  P:  SCD's / Lovenox,  INFECTIOUS  A:  Leukocytosis  CAP - left consolidation: rhinovirus w/ prob bacterial superinfection  Enterococcus UTI  P:  BCx2 10/7 >> neg  UCx 10/7 >> enterococcus (amp sens)  RVP 10/7 >> + rhinovirus  Changed to oral levaquin 10/9>>>10/15  Vanc 10/15>>>10/20  ceftaz 10/15>>>10/24  Monitor off abx, completed course  ENDOCRINE  A:  DM  P:  SSI  140-180 goals  NEUROLOGIC  A:  Acute metabolic encephalopathy-->resolved.  Deconditioning.  P:  [place]  Palliative care meet:  10/26 1130 am. Palliative care has met with family and terminal extubation instituted. 10/27 he is on NIMVS and fentanyl drip. Take off NIMVS, consider transfer to floor for continued palliation. Consider holding all non comfort medications. Appreciate A. Parker's assistance. He will be transferred to [facility] today and Dr. Audry Pili is to be MD of record.         Labs at discharge Lab Results  Component Value Date   CREATININE 1.05 10/15/2014   BUN 16 10/15/2014   NA 137 10/15/2014   K 4.1 10/15/2014   CL 92* 10/15/2014   CO2 33* 10/15/2014   Lab Results  Component Value Date   WBC 11.3* 10/15/2014   HGB 13.8 10/15/2014   HCT 43.4 10/15/2014   MCV 93.1 10/15/2014   PLT 88* 10/15/2014   Lab Results  Component Value Date   ALT 29 10/05/2014   AST 19 10/05/2014   ALKPHOS 47 10/05/2014   BILITOT 0.5 10/05/2014   Lab Results  Component Value Date   INR 1.23 09/25/2014   INR 1.12 11/15/2011   INR 1.0 07/26/2008    Current radiology studies Dg Chest Port 1 View  10/15/2014   CLINICAL DATA:  Respiratory failure  EXAM: PORTABLE CHEST - 1 VIEW  COMPARISON:  October 14, 2014  FINDINGS: Endotracheal tube and nasogastric tube have been removed. There is no apparent  pneumothorax. There is persistent bibasilar atelectatic change. There is mild consolidation in the left base, stable. Elsewhere lungs are clear. Heart size and pulmonary vascular normal. No adenopathy. No bone lesions.  IMPRESSION: No apparent pneumothorax. Persistent left base consolidation with bibasilar atelectatic change. No appreciable new opacity. No change in cardiac silhouette.   Electronically Signed   By: Lowella Grip M.D.   On: 10/15/2014 07:14    Disposition: [facility]      Discharge Instructions   Discharge patient    Complete by:  As directed             Medication List    STOP taking these medications       amLODipine 5 MG tablet  Commonly known as:  NORVASC     aspirin  81 MG tablet     benazepril 20 MG tablet  Commonly known as:  LOTENSIN     carvedilol 25 MG tablet  Commonly known as:  COREG     doxazosin 2 MG tablet  Commonly known as:  CARDURA     metFORMIN 500 MG tablet  Commonly known as:  GLUCOPHAGE     pravastatin 20 MG tablet  Commonly known as:  PRAVACHOL     Vitamin D 2000 UNITS Caps      TAKE these medications       furosemide 20 MG tablet  Commonly known as:  LASIX  Take 20 mg by mouth once a week. Take 1 Tab Weekly As Needed For Swelling     ipratropium-albuterol 0.5-2.5 (3) MG/3ML Soln  Commonly known as:  DUONEB  Take 3 mLs by nebulization every 6 (six) hours.     LORazepam 2 MG/ML injection  Commonly known as:  ATIVAN  Inject 0.5 mLs (1 mg total) into the vein every 4 (four) hours as needed for anxiety.     metoprolol tartrate 25 MG tablet  Commonly known as:  LOPRESSOR  Take 1 tablet (25 mg total) by mouth 2 (two) times daily.     morphine 2 MG/ML injection  Inject 0.5 mLs (1 mg total) into the vein every 2 (two) hours as needed (dyspnea, tachypnea).     predniSONE 10 MG tablet  Commonly known as:  DELTASONE  Take 1 tablet (10 mg total) by mouth daily with breakfast.     sorbitol 70 % Soln  Take 30  mLs by mouth daily as needed for mild constipation.          Discharged Condition: poor  Time spent on discharge greater than 40 minutes.  Vital signs at Discharge. Temp:  [97.5 F (36.4 C)-99 F (37.2 C)] 98.2 F (36.8 C) (10/28 0600) Pulse Rate:  [85-110] 85 (10/28 0600) Resp:  [16-33] 19 (10/28 0600) BP: (99-152)/(59-81) 122/64 mmHg (10/28 0600) SpO2:  [95 %-100 %] 98 % (10/28 0835) Weight:  [184 lb 1.4 oz (83.5 kg)] 184 lb 1.4 oz (83.5 kg) (10/28 0400) Office follow up Special Information or instructions. Follow up with [facility] and Dr. Avel Sensor. Signed: Richardson Landry Minor ACNP Maryanna Shape PCCM Pager [phone] till 3 pm If no answer page [phone] 2014/11/15, 9:26 AM   Baltazar Apo, MD, PhD 11/15/14, 11:25 AM [place] [facility] [phone] or if no answer [phone]

## 2014-10-20 NOTE — Progress Notes (Signed)
Nutrition Brief Note  Chart reviewed. Pt terminally extubated on 10/26 and now transitioning to comfort care.  No further nutrition interventions warranted at this time.  Please re-consult as needed.   Philip HugerSarah F Alizandra Loh MS RD LDN Clinical Dietitian Pager:[phone]

## 2014-10-20 NOTE — Progress Notes (Signed)
Pt [facility].  CSW facilitated pt discharge needs including contacting facility, faxing pt discharge summary, discussing with pt and pt family members including pt sister, Philip [name] at bedside, providing RN phone number to [facility] [phone]), and arranging ambulance [facility] via [facility].   Pt coping as well as to be anticipated with [facility]. Pt expressed concern about having clothing and CSW discussed with pt that [facility] upon arrival. Pt family supportive and assisting in easing pt concerns at bedside. Pt sister expressed that she would like list of staff that assisted pt during hospitalization. Pt sister expressed gratefulness for staff and the care that has been provided to pt. Appreciate Charge RN, Judeth CornfieldStephanie assistance in obtaining list. List of staff provided to pt sister.  No further social work needs identified at this time.  CSW signing off.   Philip [name], MSW, LCSW Clinical Social Work [phone]

## 2014-10-20 DEATH — deceased

## 2015-08-20 IMAGING — CR DG CHEST 1V PORT
1 series · 1 of 1 positions shown · non-contrast
Comparison: 09/28/2014

CLINICAL DATA: Shortness of breath, no chest pain, cough,
congestion

EXAM:
PORTABLE CHEST - 1 VIEW

[AP]
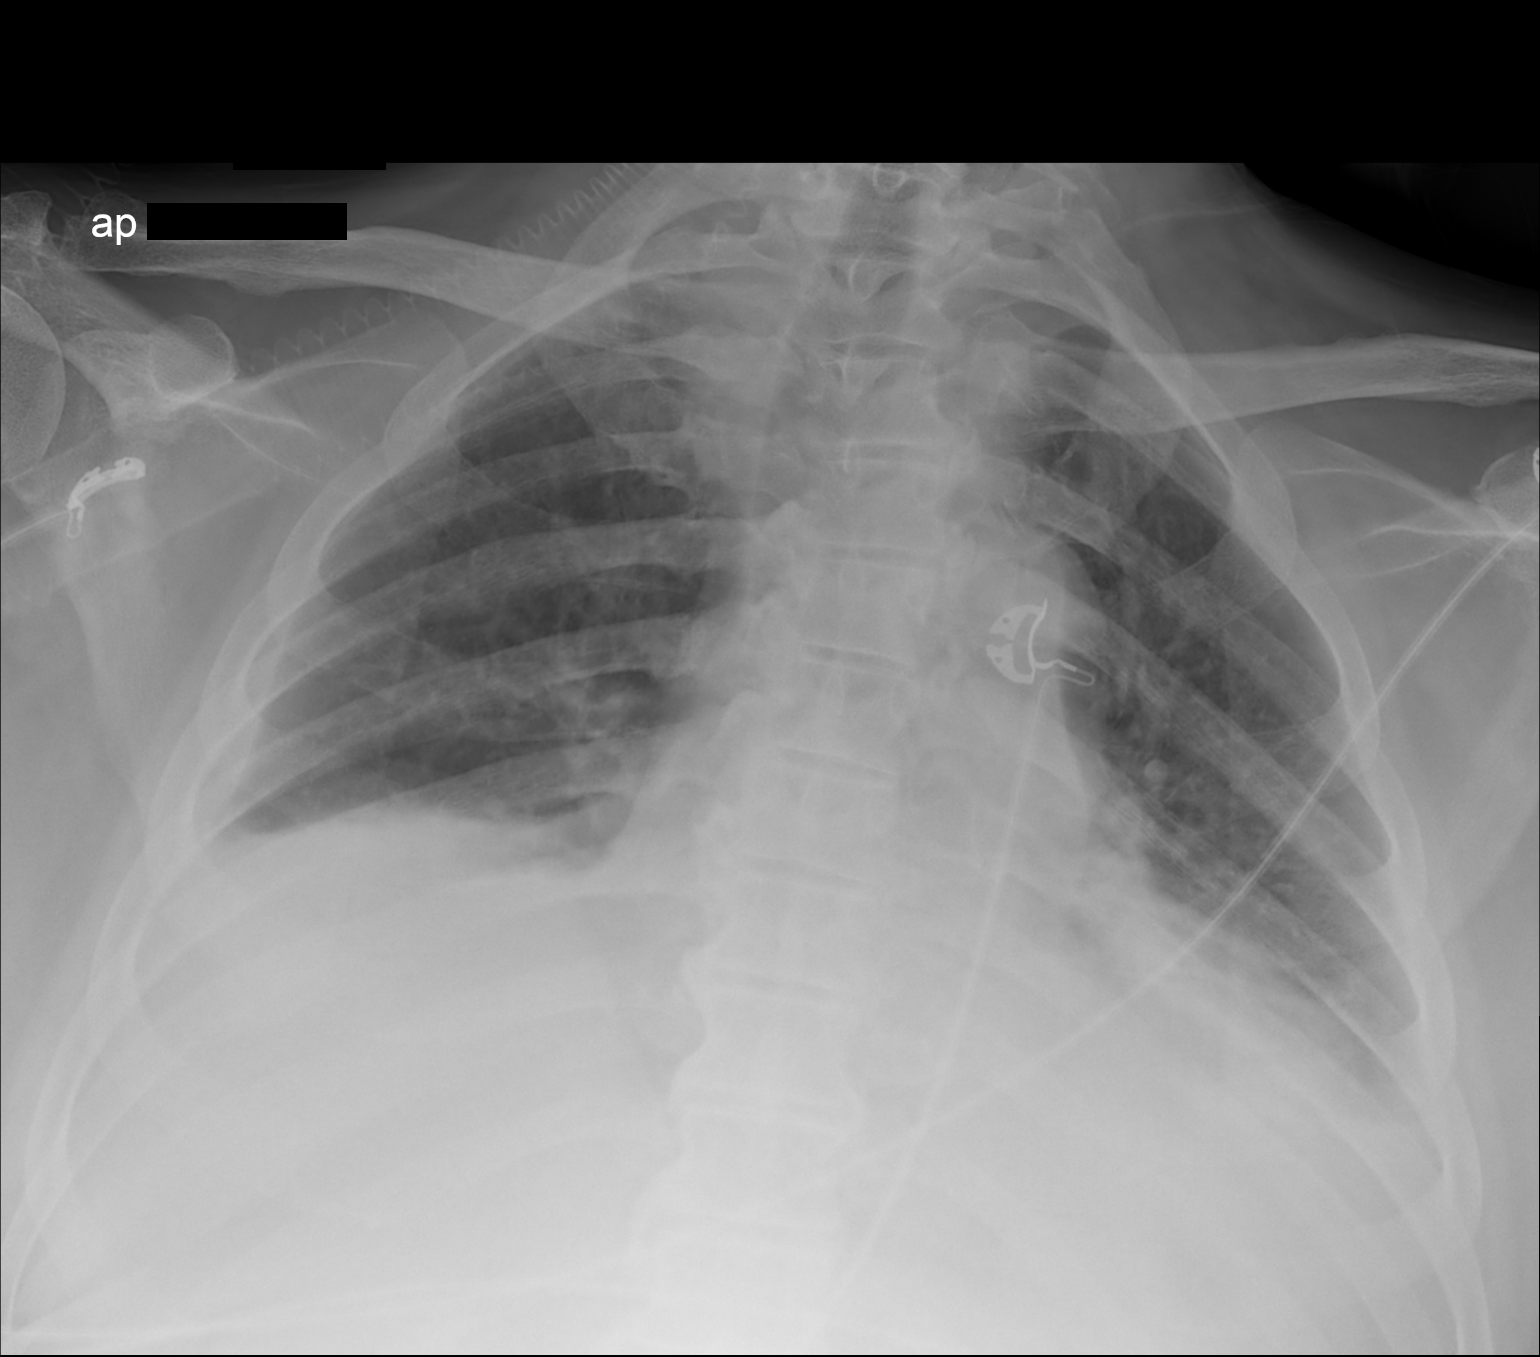

[1 of 1 positions shown; findings below may reference images not displayed]

FINDINGS: Cardiomegaly cardiomegaly. Again noted small bilateral pleural
effusion with bilateral basilar atelectasis or infiltrate. No
pulmonary edema. There is poor inspiration.
IMPRESSION: Poor inspiration. Again noted small bilateral pleural effusion with
bilateral basilar atelectasis or infiltrate. No pulmonary edema.

## 2015-08-21 IMAGING — CR DG CHEST 1V PORT
1 series · 1 of 1 positions shown · non-contrast
Comparison: 09/28/2014

CLINICAL DATA: Shortness of breath, tobacco user, history of
prostate cancer

EXAM:
PORTABLE CHEST - 1 VIEW

[AP]
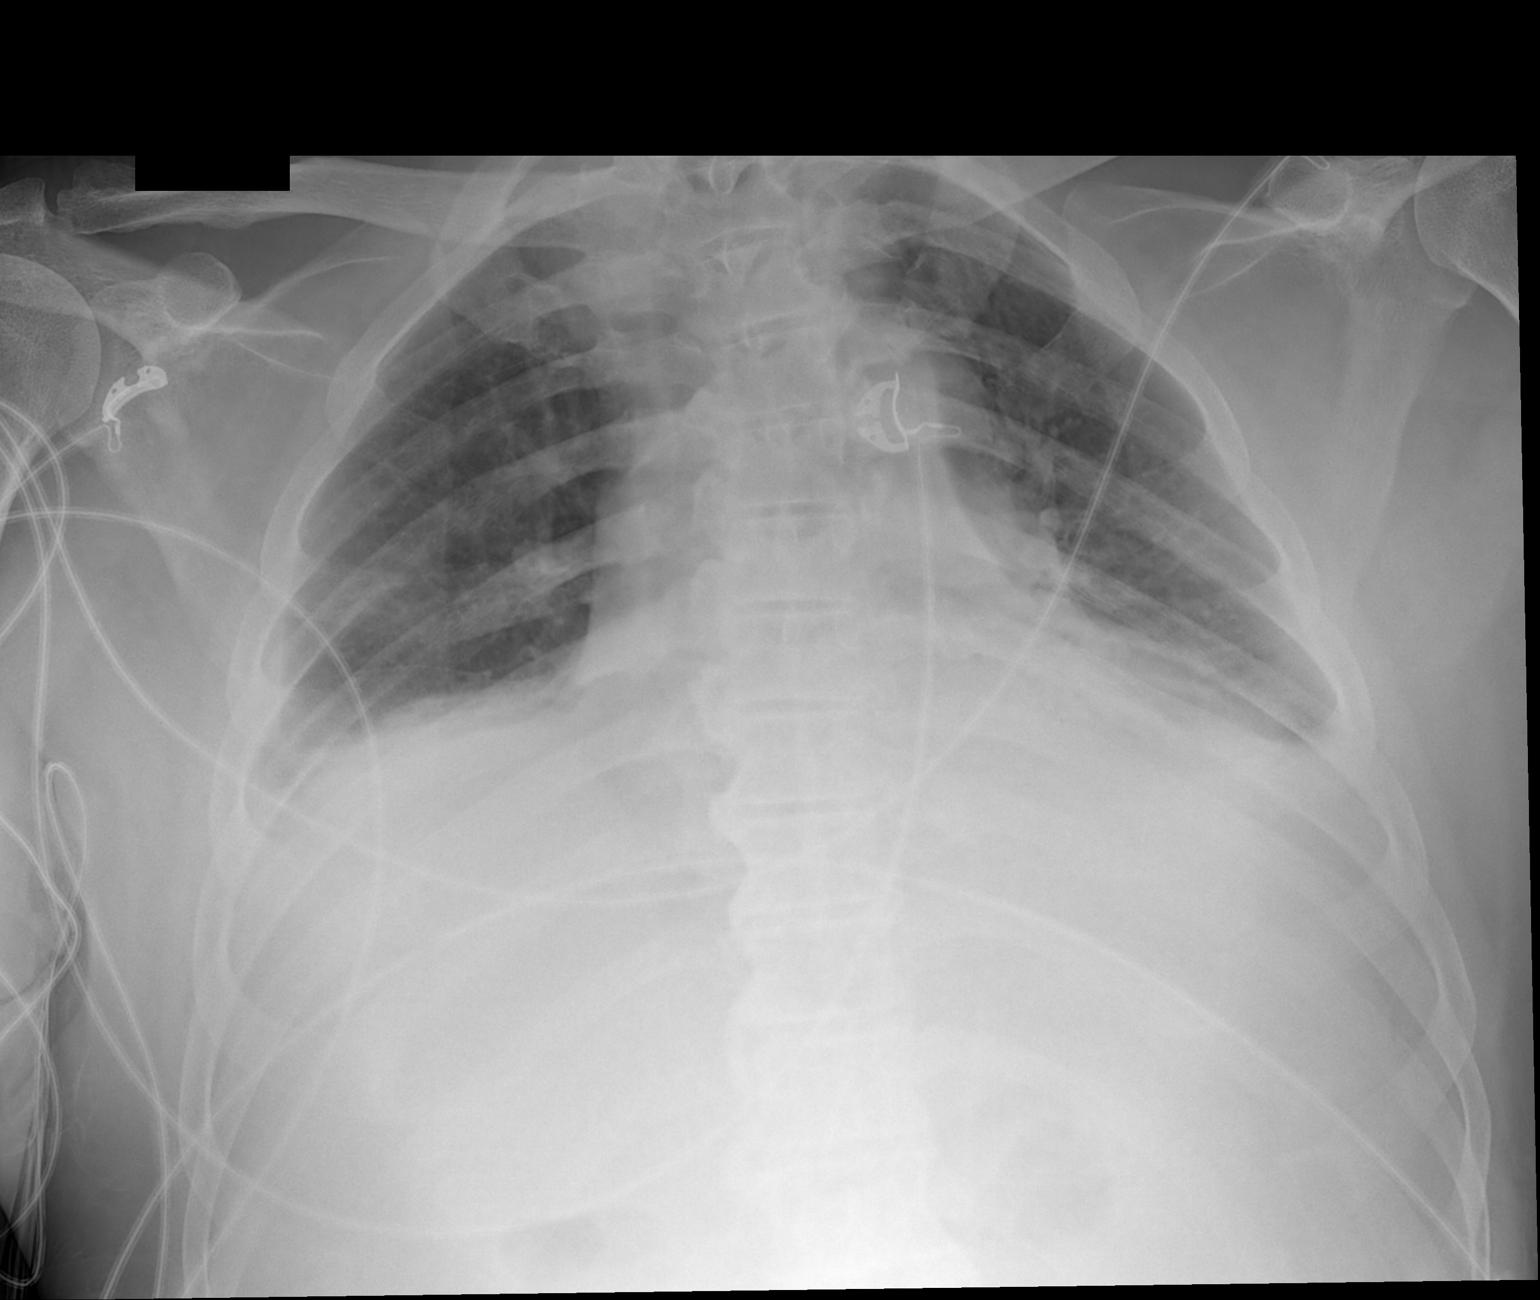

[1 of 1 positions shown; findings below may reference images not displayed]

FINDINGS: Cardiomediastinal silhouette is stable. There is poor inspiration.
Persistent small bilateral pleural effusion with bilateral basilar
atelectasis or infiltrate. No pulmonary edema.
IMPRESSION: Poor inspiration. Persistent small pleural effusion with bilateral
basilar atelectasis or infiltrate. No pulmonary edema.

## 2015-08-22 IMAGING — DX DG CHEST 1V PORT
1 series · 1 of 1 positions shown · non-contrast
Comparison: 09/29/2014

CLINICAL DATA: Respiratory failure.

EXAM:
PORTABLE CHEST - 1 VIEW

[chest ap]
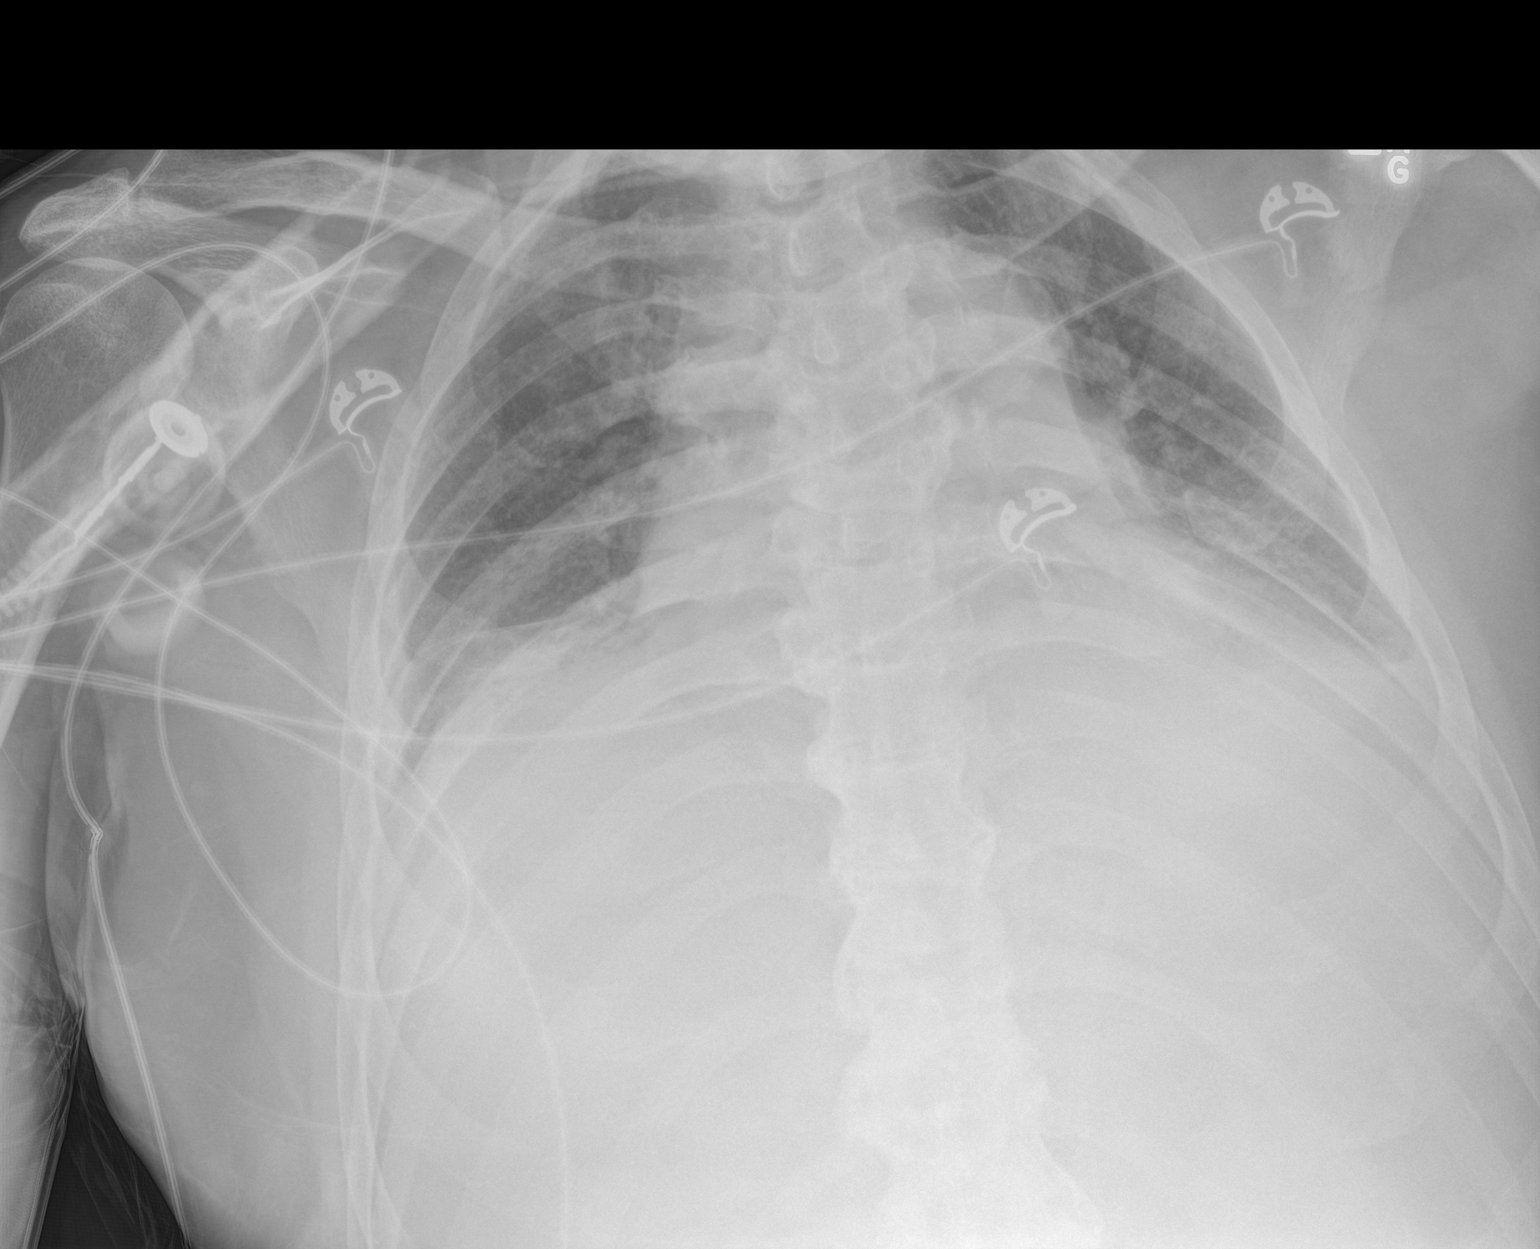

[1 of 1 positions shown; findings below may reference images not displayed]

FINDINGS: Low lung volumes accentuate cardiomediastinal silhouette which is
probably increased. Developing pulmonary edema. Bibasilar
atelectasis with effusions. No pneumothorax. No support apparatus.
IMPRESSION: Worsening aeration.  Developing pulmonary edema.
